# Patient Record
Sex: Female | Born: 1950 | Race: Black or African American | Hispanic: No | Marital: Single
Health system: Southern US, Community
[De-identification: ages and names within clinical notes are randomized; demographics above are authoritative.]

## PROBLEM LIST (undated history)

## (undated) DIAGNOSIS — F79 Unspecified intellectual disabilities: Secondary | ICD-10-CM

## (undated) DIAGNOSIS — I10 Essential (primary) hypertension: Secondary | ICD-10-CM

## (undated) DIAGNOSIS — T7840XA Allergy, unspecified, initial encounter: Secondary | ICD-10-CM

## (undated) HISTORY — DX: Allergy, unspecified, initial encounter: T78.40XA

## (undated) HISTORY — DX: Essential (primary) hypertension: I10

## (undated) HISTORY — DX: Unspecified intellectual disabilities: F79

## (undated) HISTORY — PX: HERNIA REPAIR: SHX51

---

## 1998-03-26 ENCOUNTER — Encounter: Admission: RE | Admit: 1998-03-26 | Discharge: 1998-03-26 | Payer: Self-pay | Admitting: Family Medicine

## 1998-07-24 ENCOUNTER — Encounter: Admission: RE | Admit: 1998-07-24 | Discharge: 1998-07-24 | Payer: Self-pay | Admitting: Family Medicine

## 1998-08-19 ENCOUNTER — Encounter: Admission: RE | Admit: 1998-08-19 | Discharge: 1998-08-19 | Payer: Self-pay | Admitting: Family Medicine

## 1999-01-26 ENCOUNTER — Encounter: Admission: RE | Admit: 1999-01-26 | Discharge: 1999-01-26 | Payer: Self-pay | Admitting: Family Medicine

## 1999-04-29 ENCOUNTER — Emergency Department (HOSPITAL_COMMUNITY): Admission: EM | Admit: 1999-04-29 | Discharge: 1999-04-30 | Payer: Self-pay | Admitting: *Deleted

## 1999-05-12 ENCOUNTER — Encounter: Admission: RE | Admit: 1999-05-12 | Discharge: 1999-05-12 | Payer: Self-pay | Admitting: Family Medicine

## 1999-06-17 ENCOUNTER — Encounter: Admission: RE | Admit: 1999-06-17 | Discharge: 1999-06-17 | Payer: Self-pay | Admitting: Family Medicine

## 1999-07-03 ENCOUNTER — Encounter: Admission: RE | Admit: 1999-07-03 | Discharge: 1999-07-03 | Payer: Self-pay | Admitting: Family Medicine

## 2000-01-06 ENCOUNTER — Encounter: Admission: RE | Admit: 2000-01-06 | Discharge: 2000-01-06 | Payer: Self-pay | Admitting: Family Medicine

## 2000-09-21 ENCOUNTER — Encounter (INDEPENDENT_AMBULATORY_CARE_PROVIDER_SITE_OTHER): Payer: Self-pay | Admitting: *Deleted

## 2000-10-03 ENCOUNTER — Encounter: Admission: RE | Admit: 2000-10-03 | Discharge: 2000-10-03 | Payer: Self-pay | Admitting: Family Medicine

## 2001-06-29 ENCOUNTER — Encounter: Admission: RE | Admit: 2001-06-29 | Discharge: 2001-06-29 | Payer: Self-pay | Admitting: Family Medicine

## 2002-02-12 ENCOUNTER — Encounter: Admission: RE | Admit: 2002-02-12 | Discharge: 2002-02-12 | Payer: Self-pay | Admitting: Sports Medicine

## 2002-03-12 ENCOUNTER — Encounter: Admission: RE | Admit: 2002-03-12 | Discharge: 2002-03-12 | Payer: Self-pay | Admitting: Family Medicine

## 2002-08-24 ENCOUNTER — Encounter: Admission: RE | Admit: 2002-08-24 | Discharge: 2002-08-24 | Payer: Self-pay | Admitting: Family Medicine

## 2003-08-05 ENCOUNTER — Encounter: Admission: RE | Admit: 2003-08-05 | Discharge: 2003-08-05 | Payer: Self-pay | Admitting: Family Medicine

## 2004-11-12 ENCOUNTER — Ambulatory Visit: Payer: Self-pay | Admitting: Family Medicine

## 2005-07-15 ENCOUNTER — Emergency Department (HOSPITAL_COMMUNITY): Admission: EM | Admit: 2005-07-15 | Discharge: 2005-07-15 | Payer: Self-pay | Admitting: Family Medicine

## 2005-08-04 ENCOUNTER — Ambulatory Visit: Payer: Self-pay | Admitting: Family Medicine

## 2005-10-20 ENCOUNTER — Ambulatory Visit: Payer: Self-pay | Admitting: Family Medicine

## 2006-02-02 ENCOUNTER — Emergency Department (HOSPITAL_COMMUNITY): Admission: EM | Admit: 2006-02-02 | Discharge: 2006-02-02 | Payer: Self-pay | Admitting: Emergency Medicine

## 2006-07-04 ENCOUNTER — Emergency Department (HOSPITAL_COMMUNITY): Admission: EM | Admit: 2006-07-04 | Discharge: 2006-07-04 | Payer: Self-pay | Admitting: Family Medicine

## 2006-07-07 ENCOUNTER — Ambulatory Visit: Payer: Self-pay | Admitting: Sports Medicine

## 2006-08-02 ENCOUNTER — Emergency Department (HOSPITAL_COMMUNITY): Admission: EM | Admit: 2006-08-02 | Discharge: 2006-08-02 | Payer: Self-pay | Admitting: Family Medicine

## 2006-11-02 ENCOUNTER — Ambulatory Visit: Payer: Self-pay | Admitting: Sports Medicine

## 2006-11-09 ENCOUNTER — Ambulatory Visit: Payer: Self-pay | Admitting: Family Medicine

## 2006-11-17 DIAGNOSIS — F70 Mild intellectual disabilities: Secondary | ICD-10-CM

## 2006-11-17 DIAGNOSIS — I10 Essential (primary) hypertension: Secondary | ICD-10-CM | POA: Insufficient documentation

## 2006-11-17 DIAGNOSIS — E669 Obesity, unspecified: Secondary | ICD-10-CM | POA: Insufficient documentation

## 2006-11-17 DIAGNOSIS — J45909 Unspecified asthma, uncomplicated: Secondary | ICD-10-CM | POA: Insufficient documentation

## 2006-11-18 ENCOUNTER — Encounter (INDEPENDENT_AMBULATORY_CARE_PROVIDER_SITE_OTHER): Payer: Self-pay | Admitting: *Deleted

## 2006-12-08 ENCOUNTER — Telehealth (INDEPENDENT_AMBULATORY_CARE_PROVIDER_SITE_OTHER): Payer: Self-pay | Admitting: Family Medicine

## 2007-01-11 ENCOUNTER — Telehealth (INDEPENDENT_AMBULATORY_CARE_PROVIDER_SITE_OTHER): Payer: Self-pay | Admitting: Family Medicine

## 2007-02-02 ENCOUNTER — Telehealth (INDEPENDENT_AMBULATORY_CARE_PROVIDER_SITE_OTHER): Payer: Self-pay | Admitting: Family Medicine

## 2007-04-06 ENCOUNTER — Encounter: Payer: Self-pay | Admitting: Family Medicine

## 2007-04-06 ENCOUNTER — Ambulatory Visit: Payer: Self-pay | Admitting: Family Medicine

## 2007-06-02 ENCOUNTER — Encounter: Payer: Self-pay | Admitting: Family Medicine

## 2007-06-21 ENCOUNTER — Telehealth (INDEPENDENT_AMBULATORY_CARE_PROVIDER_SITE_OTHER): Payer: Self-pay | Admitting: Family Medicine

## 2007-06-26 ENCOUNTER — Emergency Department (HOSPITAL_COMMUNITY): Admission: EM | Admit: 2007-06-26 | Discharge: 2007-06-26 | Payer: Self-pay | Admitting: Emergency Medicine

## 2007-06-27 ENCOUNTER — Telehealth: Payer: Self-pay | Admitting: *Deleted

## 2007-06-28 ENCOUNTER — Ambulatory Visit: Payer: Self-pay | Admitting: Family Medicine

## 2007-07-25 ENCOUNTER — Telehealth: Payer: Self-pay | Admitting: Family Medicine

## 2007-08-01 ENCOUNTER — Telehealth: Payer: Self-pay | Admitting: Family Medicine

## 2007-08-04 ENCOUNTER — Ambulatory Visit: Payer: Self-pay | Admitting: Family Medicine

## 2007-08-14 ENCOUNTER — Telehealth: Payer: Self-pay | Admitting: Family Medicine

## 2007-08-30 ENCOUNTER — Telehealth: Payer: Self-pay | Admitting: Family Medicine

## 2007-11-16 ENCOUNTER — Telehealth: Payer: Self-pay | Admitting: Family Medicine

## 2008-03-01 ENCOUNTER — Telehealth: Payer: Self-pay | Admitting: Family Medicine

## 2008-03-04 ENCOUNTER — Ambulatory Visit: Payer: Self-pay | Admitting: Family Medicine

## 2008-03-05 ENCOUNTER — Telehealth: Payer: Self-pay | Admitting: Family Medicine

## 2008-05-16 ENCOUNTER — Telehealth: Payer: Self-pay | Admitting: Family Medicine

## 2008-09-15 ENCOUNTER — Emergency Department (HOSPITAL_COMMUNITY): Admission: EM | Admit: 2008-09-15 | Discharge: 2008-09-15 | Payer: Self-pay | Admitting: Family Medicine

## 2008-10-23 ENCOUNTER — Telehealth: Payer: Self-pay | Admitting: Family Medicine

## 2008-11-04 ENCOUNTER — Telehealth: Payer: Self-pay | Admitting: *Deleted

## 2008-11-06 ENCOUNTER — Encounter: Payer: Self-pay | Admitting: Family Medicine

## 2009-03-03 ENCOUNTER — Telehealth: Payer: Self-pay | Admitting: Family Medicine

## 2009-03-05 ENCOUNTER — Ambulatory Visit: Payer: Self-pay | Admitting: Family Medicine

## 2009-04-14 ENCOUNTER — Telehealth: Payer: Self-pay | Admitting: *Deleted

## 2009-08-04 ENCOUNTER — Telehealth: Payer: Self-pay | Admitting: Family Medicine

## 2009-08-07 ENCOUNTER — Ambulatory Visit: Payer: Self-pay | Admitting: Family Medicine

## 2009-08-07 ENCOUNTER — Encounter: Payer: Self-pay | Admitting: Family Medicine

## 2009-08-07 DIAGNOSIS — J309 Allergic rhinitis, unspecified: Secondary | ICD-10-CM

## 2009-08-07 LAB — CONVERTED CEMR LAB
BUN: 23 mg/dL (ref 6–23)
Chloride: 101 meq/L (ref 96–112)
Creatinine, Ser: 1.16 mg/dL (ref 0.40–1.20)
Glucose, Bld: 79 mg/dL (ref 70–99)
LDH: 148 units/L (ref 94–250)
Potassium: 4.3 meq/L (ref 3.5–5.3)

## 2009-08-08 ENCOUNTER — Encounter: Payer: Self-pay | Admitting: Family Medicine

## 2009-09-09 ENCOUNTER — Telehealth: Payer: Self-pay | Admitting: *Deleted

## 2009-09-10 ENCOUNTER — Telehealth (INDEPENDENT_AMBULATORY_CARE_PROVIDER_SITE_OTHER): Payer: Self-pay | Admitting: *Deleted

## 2009-09-15 ENCOUNTER — Telehealth: Payer: Self-pay | Admitting: Family Medicine

## 2009-12-31 ENCOUNTER — Telehealth: Payer: Self-pay | Admitting: Family Medicine

## 2010-01-01 ENCOUNTER — Encounter: Payer: Self-pay | Admitting: *Deleted

## 2010-03-02 ENCOUNTER — Encounter: Payer: Self-pay | Admitting: Family Medicine

## 2010-06-26 ENCOUNTER — Encounter: Payer: Self-pay | Admitting: Family Medicine

## 2010-09-07 ENCOUNTER — Emergency Department (HOSPITAL_COMMUNITY)
Admission: EM | Admit: 2010-09-07 | Discharge: 2010-09-07 | Payer: Self-pay | Source: Home / Self Care | Admitting: Emergency Medicine

## 2010-09-08 ENCOUNTER — Telehealth (INDEPENDENT_AMBULATORY_CARE_PROVIDER_SITE_OTHER): Payer: Self-pay | Admitting: *Deleted

## 2010-09-30 ENCOUNTER — Ambulatory Visit: Admission: RE | Admit: 2010-09-30 | Discharge: 2010-09-30 | Payer: Self-pay | Source: Home / Self Care

## 2010-09-30 DIAGNOSIS — M25469 Effusion, unspecified knee: Secondary | ICD-10-CM | POA: Insufficient documentation

## 2010-09-30 DIAGNOSIS — G5790 Unspecified mononeuropathy of unspecified lower limb: Secondary | ICD-10-CM | POA: Insufficient documentation

## 2010-10-22 NOTE — Progress Notes (Signed)
Summary: meds prob  Phone Note Refill Request Call back at Home Phone [phone] Message from:  mom-Mary  Refills Requested: Medication #1:  VENTOLIN HFA 108 (90 BASE) MCG/ACT AERS 1-2 puffs every 4 hours as needed for cough or wheeze.  Dispense with spacer. lost the inhaler and insurance will not pay since it is too early - pls advise - would like to have 2 at a time so this won't happen again.  Initial call taken by: De Nurse,  December 31, 2009 8:49 AM  Follow-up for Phone Call        to pcp Follow-up by: Golden Circle RN,  December 31, 2009 9:10 AM  Additional Follow-up for Phone Call Additional follow up Details #1::        [facility]--[facility] [address] at [facility]. Additional Follow-up by: Romero Belling MD,  December 31, 2009 9:38 AM    New/Updated Medications: VENTOLIN HFA 108 (90 BASE) MCG/ACT AERS (ALBUTEROL SULFATE) 1-2 puffs every 4 hours as needed for cough or wheeze.  Dispense with spacer. Prescriptions: VENTOLIN HFA 108 (90 BASE) MCG/ACT AERS (ALBUTEROL SULFATE) 1-2 puffs every 4 hours as needed for cough or wheeze.  Dispense with spacer.  #2 x 3   Entered and Authorized by:   Romero Belling MD   Signed by:   Romero Belling MD on 12/31/2009   Method used:   [facility]        [facility]  [address] [id number]       [address]       [place], [place]  [address]       Ph: [phone] or [phone]       Fax: [phone]   RxID:   [id number]

## 2010-10-22 NOTE — Miscellaneous (Signed)
Summary: application  Clinical Lists Changes  rec'd application for Day Program for Adults  - pls call pt when ready De Nurse  March 02, 2010 5:35 PM  Done -- placed in "to be called" box.  Romero Belling MD  March 03, 2010 8:33 AM

## 2010-10-22 NOTE — Letter (Signed)
Summary: Generic Letter  [facility]  [address]   [place], [place] [address]   Phone: [phone]  Fax: [phone]    01/01/2010  [facility] [address] [place], [place]  [address]      Regarding above named  patient ,  Alyssa Powell :         It is noted in patient's chart  on March 04, 2008 that patient was instructed to use Proair HFA 108 ( 90 base ) Mcg/act Aers ( albuterol sulfate ) 2 puffs every four hours as needed . A new prescription for this was sent to pharmacy on 04/16/2008.               Sincerely,   Theresia Lo RN  Appended Document: Generic [facility] at their [facility]. they are being audited for this medication and since directions were to use as directed they needed to know exactly how patient was instructed to use.

## 2010-10-22 NOTE — Assessment & Plan Note (Signed)
Summary: follow up and meet new doctor/ls   Vital Signs:  Patient profile:   60 year old female Height:      64 inches Weight:      216.1 pounds BMI:     37.23 Temp:     98.4 degrees F oral Pulse rate:   78 / minute BP sitting:   148 / 88  (left arm) Cuff size:   regular  Vitals Entered By: Golden Circle RN (September 30, 2010 2:19 PM) CC: f/u left knee pain Is Patient Diabetic? No   Primary Care Provider:  Ellery Plunk MD  CC:  f/u left knee pain.  History of Present Illness: knee pain- 3 weeks ago had a hot swollen knee.  no injury.  went to urgent care had xray and was told had arthritis.  no meds prescribed.  has gotten better, less swollen, almost no pain  CPE- no concerns, no itch, no discharge, no sexual partner.  mammogram 2 years ago.  Habits & Providers  Alcohol-Tobacco-Diet     Tobacco Status: never  Current Medications (verified): 1)  Advair Diskus 500-50 Mcg/dose Aepb (Fluticasone-Salmeterol) .Marland Kitchen.. 1 Puff Inhaled Two Times A Day 2)  Ventolin Hfa 108 (90 Base) Mcg/act Aers (Albuterol Sulfate) .Marland Kitchen.. 1-2 Puffs Every 4 Hours As Needed For Cough or Wheeze.  Dispense With Spacer. 3)  Fexofenadine Hcl 180 Mg  Tabs (Fexofenadine Hcl) .Marland Kitchen.. 1 Tab By Mouth Daily 4)  Fluticasone Propionate 50 Mcg/act Susp (Fluticasone Propionate) .... 2 Sprays Each Nostril Once Daily 5)  Lisinopril-Hydrochlorothiazide 20-25 Mg Tabs (Lisinopril-Hydrochlorothiazide) .Marland Kitchen.. 1 Tab By Mouth Daily 6)  Trazodone Hcl 100 Mg Tabs (Trazodone Hcl) .... 1/2 - 1 Tab By Mouth Two Times A Day As Needed 7)  Corfen-Dm 06-23-14 Mg/77ml Liqd (Phenylephrine-Chlorphen-Dm) .... Per Instructions On Bottle 8)  Tessalon Perles 100 Mg Caps (Benzonatate) .Marland Kitchen.. 1-2 Caps By Mouth Three Times A Day As Needed For Cough  Allergies (verified): 1)  Codeine Phosphate (Codeine Phosphate) 2)  Amoxicillin (Amoxicillin)  Review of Systems  The patient denies fever, hoarseness, and prolonged cough.    Physical Exam  General:   Well-developed,well-nourished,in no acute distress; alert,appropriate and cooperative throughout examination Lungs:  Normal respiratory effort, chest expands symmetrically. Lungs are clear to auscultation, no crackles or wheezes. Heart:  Normal rate and regular rhythm. S1 and S2 normal without gallop, murmur, click, rub or other extra sounds. Abdomen:  Bowel sounds positive,abdomen soft and non-tender without masses, organomegaly or hernias noted. Genitalia:  Pelvic Exam:        External: normal female genitalia without lesions or masses        Vagina: normal without lesions or masses        Cervix: normal without lesions or masses-some friability        Adnexa: normal bimanual exam without masses or fullness        Uterus: normal by palpation        Pap smear: performed Msk:  Knee: Normal to inspection with no erythema or effusion or obvious bony abnormalities. Palpation normal with no warmth or joint line tenderness or patellar tenderness or condyle tenderness. ROM normal in flexion and extension and lower leg rotation. Ligaments with solid consistent endpoints including ACL, PCL, LCL, MCL. Negative Mcmurray's and provocative meniscal tests. Non painful patellar compression. Patellar and quadriceps tendons unremarkable. Hamstring and quadriceps strength is normal.     Impression & Recommendations:  Problem # 1:  HEALTH MAINTENANCE EXAM (ICD-V70.0) Assessment Unchanged no changes today.  pt  to return for cholesterol and bmet check Orders: [facility] - Est  40-64 yrs ([address])  Problem # 2:  EFFUSION OF LOWER LEG JOINT (ICD-719.06) Assessment: New knee joint looks normal today with minimal tenderness. hx is not consistent with OA.  consider gout.  mom with hx of gout.  will check uric acid Orders: Uric Acid-[facility] ([id number]) [facility] - Est  40-64 yrs ([id number])  Complete Medication List: 1)  Advair Diskus 500-50 Mcg/dose Aepb (Fluticasone-salmeterol) .Marland Kitchen.. 1 puff inhaled two times a day 2)   Ventolin Hfa 108 (90 Base) Mcg/act Aers (Albuterol sulfate) .Marland Kitchen.. 1-2 puffs every 4 hours as needed for cough or wheeze.  dispense with spacer. 3)  Fexofenadine Hcl 180 Mg Tabs (Fexofenadine hcl) .Marland Kitchen.. 1 tab by mouth daily 4)  Fluticasone Propionate 50 Mcg/act Susp (Fluticasone propionate) .... 2 sprays each nostril once daily 5)  Lisinopril-hydrochlorothiazide 20-25 Mg Tabs (Lisinopril-hydrochlorothiazide) .Marland Kitchen.. 1 tab by mouth daily 6)  Trazodone Hcl 100 Mg Tabs (Trazodone hcl) .... 1/2 - 1 tab by mouth two times a day as needed 7)  Corfen-dm 06-23-14 Mg/20ml Liqd (Phenylephrine-chlorphen-dm) .... Per instructions on bottle 8)  Tessalon Perles 100 Mg Caps (Benzonatate) .Marland Kitchen.. 1-2 caps by mouth three times a day as needed for cough  Other Orders: Lipid-[facility] ([id number]) Basic Met-[facility] ([id number]) Pap Smear-[facility] ([id number])  Patient Instructions: 1)  It was nice to meet  you today. 2)  Please come back for a morning lab appt to check your blood work.  Do not eat or drink after midnight the day before. 3)  Please get your mammogram   Orders Added: 1)  Lipid-[facility] [[id number]] 2)  Basic Met-[facility] [[id number]] 3)  Uric Acid-[facility] [[id number]] 4)  Pap Smear-[facility] [[id number]] 5)  [facility] - Est  40-64 yrs [[id number]]

## 2010-10-22 NOTE — Miscellaneous (Signed)
  Clinical Lists Changes  Problems: Changed problem from ASTHMA, UNSPECIFIED (ICD-493.90) to ASTHMA, PERSISTENT (ICD-493.90) 

## 2010-10-22 NOTE — Progress Notes (Signed)
Summary: refill  Phone Note Refill Request Call back at [phone] Message from:  mom-Mary  Refills Requested: Medication #1:  VENTOLIN HFA 108 (90 BASE) MCG/ACT AERS 1-2 puffs every 4 hours as needed for cough or wheeze.  Dispense with spacer.  Medication #2:  ADVAIR DISKUS 500-50 MCG/DOSE AEPB 1 puff inhaled two times a day pt is out and needs asap Also mom is requesting an rx for the Allegra  Initial call taken by: Abundio Miu,  September 08, 2010 9:48 AM Caller: Mom-Mary    see next note. Theresia Lo RN  September 08, 2010 3:19 PM

## 2010-10-31 ENCOUNTER — Other Ambulatory Visit: Payer: Self-pay | Admitting: Family Medicine

## 2010-11-01 NOTE — Telephone Encounter (Signed)
Refill request

## 2010-12-10 ENCOUNTER — Other Ambulatory Visit: Payer: Self-pay

## 2010-12-11 ENCOUNTER — Other Ambulatory Visit: Payer: Medicare Other

## 2010-12-11 DIAGNOSIS — E669 Obesity, unspecified: Secondary | ICD-10-CM

## 2010-12-11 DIAGNOSIS — M25469 Effusion, unspecified knee: Secondary | ICD-10-CM

## 2010-12-11 DIAGNOSIS — I10 Essential (primary) hypertension: Secondary | ICD-10-CM

## 2010-12-11 LAB — LIPID PANEL
LDL Cholesterol: 99 mg/dL (ref 0–99)
VLDL: 9 mg/dL (ref 0–40)

## 2010-12-11 NOTE — Progress Notes (Signed)
Drew pt for a Lipid, BMP, and uric acid. AC

## 2010-12-12 ENCOUNTER — Other Ambulatory Visit: Payer: Self-pay | Admitting: Family Medicine

## 2010-12-12 LAB — BASIC METABOLIC PANEL WITH GFR
CO2: 27 mEq/L (ref 19–32)
Calcium: 9.5 mg/dL (ref 8.4–10.5)
Creat: 1.02 mg/dL (ref 0.40–1.20)
GFR, Est African American: >60 mL/min (ref >60)
Glucose, Bld: 86 mg/dL (ref 70–99)
Sodium: 140 mEq/L (ref 135–145)

## 2010-12-13 NOTE — Telephone Encounter (Signed)
Refill request

## 2010-12-14 ENCOUNTER — Encounter: Payer: Self-pay | Admitting: Family Medicine

## 2010-12-14 ENCOUNTER — Other Ambulatory Visit: Payer: Self-pay | Admitting: Family Medicine

## 2010-12-14 MED ORDER — TRAZODONE HCL 100 MG PO TABS: 50.0000 mg | ORAL_TABLET | Freq: Two times a day (BID) | ORAL | Status: DC | PRN

## 2010-12-17 ENCOUNTER — Telehealth: Payer: Self-pay | Admitting: Family Medicine

## 2010-12-17 ENCOUNTER — Other Ambulatory Visit: Payer: Self-pay | Admitting: Family Medicine

## 2010-12-17 MED ORDER — LEVOCETIRIZINE DIHYDROCHLORIDE 5 MG PO TABS: 5.0000 mg | ORAL_TABLET | Freq: Every evening | ORAL | Status: DC

## 2010-12-17 NOTE — Telephone Encounter (Signed)
Found out that insurance will pay for allergy med called Levocetirizine 5mg  - needs [facility]ap [facility] [address] Also needs to know lab results

## 2010-12-17 NOTE — Telephone Encounter (Signed)
Blood work normal, mammogram normal, sent letter

## 2010-12-18 ENCOUNTER — Telehealth: Payer: Self-pay | Admitting: Family Medicine

## 2010-12-18 NOTE — Telephone Encounter (Signed)
This would be better coming from a nurse to call patient

## 2010-12-18 NOTE — Telephone Encounter (Signed)
Please tell her to try tylenol for what looks like some arthritis pain on xray

## 2010-12-18 NOTE — Telephone Encounter (Signed)
LMOVM for pt to return call 

## 2010-12-18 NOTE — Telephone Encounter (Signed)
Wants to know what she can take for her knee pain - since bloodwork is normal. [facility] [address]

## 2010-12-21 NOTE — Telephone Encounter (Signed)
Pt mom informed.

## 2011-01-07 ENCOUNTER — Telehealth: Payer: Self-pay | Admitting: Family Medicine

## 2011-01-07 NOTE — Telephone Encounter (Signed)
Wants to know results of her labs- knee continues to swell and is painful - tylenol is not working.

## 2011-01-08 NOTE — Telephone Encounter (Signed)
i reviewed last note.  Looks like she needs appt to discuss knee again since it wasn't typical for arthritis.  If can't see me, THomas would be a good one to see her or Ian Malkin or Clayburn Pert

## 2011-01-14 ENCOUNTER — Ambulatory Visit: Payer: Medicare Other | Admitting: Family Medicine

## 2011-05-01 ENCOUNTER — Other Ambulatory Visit: Payer: Self-pay | Admitting: Family Medicine

## 2011-05-02 NOTE — Telephone Encounter (Signed)
Refill request

## 2011-06-10 ENCOUNTER — Other Ambulatory Visit: Payer: Self-pay | Admitting: Family Medicine

## 2011-06-10 MED ORDER — ALBUTEROL SULFATE HFA 108 (90 BASE) MCG/ACT IN AERS: 2.0000 | INHALATION_SPRAY | RESPIRATORY_TRACT | Status: DC | PRN

## 2011-08-31 ENCOUNTER — Other Ambulatory Visit: Payer: Self-pay | Admitting: Family Medicine

## 2011-08-31 MED ORDER — FLUTICASONE PROPIONATE 50 MCG/ACT NA SUSP: 2.0000 | Freq: Every day | NASAL | Status: DC

## 2011-09-02 ENCOUNTER — Telehealth: Payer: Self-pay | Admitting: Family Medicine

## 2011-09-02 ENCOUNTER — Telehealth: Payer: Self-pay | Admitting: *Deleted

## 2011-09-02 MED ORDER — PHENYLEPHRINE-CHLORPHEN-DM 10-4-15 MG/5ML PO LIQD: 5.0000 mL | Freq: Two times a day (BID) | ORAL | Status: DC | PRN

## 2011-09-02 NOTE — Telephone Encounter (Signed)
Called requesting rx for cough syrup, told [name] it doesn't show anything on her med list & she will need to be seen, she is insisting on getting the rx without and appt & wants to speak with RN.

## 2011-09-02 NOTE — Telephone Encounter (Signed)
Sent Rx but patient has not been seen for almost 1 year.  Needs to have an office visit .  Please inform  Thanks LC

## 2011-09-02 NOTE — Telephone Encounter (Signed)
Called mother and informed that cough med was refilled.  Patient will need an appt within 1-2 months for follow-up visit.  Mother verbalized understanding.  Gaylene Brooks, RN

## 2011-09-02 NOTE — Telephone Encounter (Signed)
Mother calling back about cough med refill.  Will route to preceptor for review and call her back shortly.   Gaylene Brooks, RN

## 2011-09-02 NOTE — Telephone Encounter (Signed)
Returned call to patient's mother.  Mother requesting refill of Corfen DM.  Patient has chronic asthma and gets a bad cough every year.  States that patient takes this medication on a long term basis for coughs.  Medication was last filled on 06/01/11.  Has mental retardation and mother is unable to bring her in today due to being elderly and issues with transportation.  Will route to Dr. Hulen Luster for refill approval since cough syrup is on patient's med list.  Gaylene Brooks, RN

## 2011-09-03 ENCOUNTER — Other Ambulatory Visit: Payer: Self-pay | Admitting: Family Medicine

## 2011-09-03 MED ORDER — PHENYLEPHRINE-CHLORPHEN-DM 10-4-15 MG/5ML PO LIQD: 5.0000 mL | Freq: Two times a day (BID) | ORAL | Status: DC | PRN

## 2011-09-03 NOTE — Telephone Encounter (Signed)
Sent in rx.

## 2011-09-06 ENCOUNTER — Other Ambulatory Visit: Payer: Self-pay | Admitting: Family Medicine

## 2011-09-06 MED ORDER — FLUTICASONE PROPIONATE 50 MCG/ACT NA SUSP: 2.0000 | Freq: Every day | NASAL | Status: DC

## 2011-11-18 ENCOUNTER — Other Ambulatory Visit: Payer: Self-pay | Admitting: Family Medicine

## 2011-11-18 NOTE — Telephone Encounter (Signed)
Refill request

## 2011-12-02 ENCOUNTER — Telehealth: Payer: Self-pay | Admitting: Family Medicine

## 2011-12-02 NOTE — Telephone Encounter (Signed)
Alyssa Powell is calling about a referral to an Orthpaedic doctor due to her knee bothering her.  Patient is not able to walk or bend the knee.

## 2011-12-02 NOTE — Telephone Encounter (Signed)
Spoke with Mom and advised that Alyssa Powell needed an appt before referral could be given.  Mom is agreeable, she will call to schedule tomorrow when the nurse aid is there because she will be the one to bring her. Alyssa Powell, Alyssa Powell

## 2011-12-16 ENCOUNTER — Ambulatory Visit (INDEPENDENT_AMBULATORY_CARE_PROVIDER_SITE_OTHER): Payer: Medicare Other | Admitting: Family Medicine

## 2011-12-16 ENCOUNTER — Encounter: Payer: Self-pay | Admitting: Family Medicine

## 2011-12-16 ENCOUNTER — Telehealth: Payer: Self-pay | Admitting: Family Medicine

## 2011-12-16 VITALS — BP 118/79 | HR 77 | Temp 98.1°F | Ht 64.0 in | Wt 212.0 lb

## 2011-12-16 DIAGNOSIS — J45909 Unspecified asthma, uncomplicated: Secondary | ICD-10-CM | POA: Diagnosis not present

## 2011-12-16 DIAGNOSIS — IMO0002 Reserved for concepts with insufficient information to code with codable children: Secondary | ICD-10-CM | POA: Diagnosis not present

## 2011-12-16 DIAGNOSIS — M1710 Unilateral primary osteoarthritis, unspecified knee: Secondary | ICD-10-CM | POA: Diagnosis not present

## 2011-12-16 DIAGNOSIS — I10 Essential (primary) hypertension: Secondary | ICD-10-CM

## 2011-12-16 NOTE — Telephone Encounter (Signed)
Patients mom is calling about Dixie complaining about her head and nose being sore.  She sneezes a lot and has drainage.  Mom wasn't sure if this was brought up at her appt this morning and is hoping that something could be called in for her.

## 2011-12-16 NOTE — Assessment & Plan Note (Signed)
Knee pain x1-2 years. Patient has not tried Advil or Tylenol for pain. Stiffness with prolonged sitting. Minimal swelling. I think she may respond nicely to an injection but her mother was here to consent for today I am not sure she can consent for herself and certainly her mother makes her medical decisions for her. Her mother requested an orthopedic referral so i have put that in today.

## 2011-12-16 NOTE — Patient Instructions (Signed)
By your request, I have sent in a referral to orthopedic surgery. I would like you to think about getting your flu shot, pneumonia shot, mammogram You can schedule your mammogram with the paper I gave you. Please let me know if you have a colonoscopy. I cannot find this in the system

## 2011-12-16 NOTE — Telephone Encounter (Signed)
Spoke with patient mother and informed her of message from MD, she states she will try the saline spray and bring her in if it gets worse.

## 2011-12-16 NOTE — Assessment & Plan Note (Signed)
BP Readings from Last 3 Encounters:  12/16/11 118/79  09/30/10 148/88  08/07/09 112/81   Well-controlled today. Continue current medications.

## 2011-12-16 NOTE — Progress Notes (Signed)
  Subjective:    Patient ID: Alyssa Powell, female    DOB: 06/09/1951, 61 y.o.   MRN: [id number]  HPI Patient is here today for knee pain. Both knees hurt but the right is worse than the left. She says it is stiff when she is sitting for prolonged periods of time but in a couple of steps to get better and easier to walk on. This is been going on for approximately 2 years. She does not think she has tried any analgesics for this. There's been no injury and no acute worsening. She had left knee films in 2011 that pointed towards mild degenerative changes. She does experience some minimal swelling on the right side.  Asthma-patient takes her albuterol inhaler less than once per week. She uses her Advair intermittently. She feels her asthma is well-controlled. She denies cough.  Hypertension-patient denies dizziness, chest pain, shortness of breath. She takes her medication daily. She takes her blood pressures at home occasionally and they have been 120s to 130s over 70s to 80s.   Review of Systems Denies nausea vomiting diarrhea    Objective:   Physical Exam  Vital signs reviewed General appearance - alert, well appearing, and in no distress Heart - normal rate, regular rhythm, normal S1, S2, no murmurs, rubs, clicks or gallops Chest - clear to auscultation, no wheezes, rales or rhonchi, symmetric air entry, no tachypnea, retractions or cyanosis Abdomen - soft, nontender, nondistended, no masses or organomegaly Knees-bilaterally knees have crepitus. There is minimal effusion on the right knee. Neither is warm. Neither has any redness.      Assessment & Plan:

## 2011-12-16 NOTE — Telephone Encounter (Signed)
Patient has zyzal and Flonase at home. She should be taking these. She may also use nasal saline sprays. She may come in again if needed to examine her nose as I did not this morning

## 2011-12-16 NOTE — Assessment & Plan Note (Signed)
Well-controlled, using her albuterol inhaler once per week at most. Continue Advair. Recommend pneumonia vaccine.

## 2011-12-23 ENCOUNTER — Telehealth: Payer: Self-pay | Admitting: Family Medicine

## 2011-12-23 NOTE — Telephone Encounter (Signed)
Spoke with patient mother and informed her of appointment on 12/29/11 at 3pm with Dr. Montez Morita.

## 2011-12-23 NOTE — Telephone Encounter (Signed)
Is asking about referral to ortho

## 2011-12-24 ENCOUNTER — Telehealth: Payer: Self-pay | Admitting: Family Medicine

## 2011-12-24 NOTE — Telephone Encounter (Signed)
Mom is calling because they need a refill on [facility] [address]  She only has 1 left.

## 2011-12-26 ENCOUNTER — Other Ambulatory Visit: Payer: Self-pay | Admitting: Family Medicine

## 2011-12-26 MED ORDER — TRAZODONE HCL 100 MG PO TABS: 50.0000 mg | ORAL_TABLET | Freq: Two times a day (BID) | ORAL | Status: DC | PRN

## 2011-12-26 NOTE — Telephone Encounter (Signed)
Refilled trazadone

## 2011-12-27 ENCOUNTER — Telehealth: Payer: Self-pay | Admitting: Family Medicine

## 2011-12-27 NOTE — Telephone Encounter (Signed)
Primary care provider better,we'll need to wait untilDr.Spiegel back.

## 2011-12-27 NOTE — Telephone Encounter (Signed)
Alyssa Powell is asking for referral to [facility] office for Alyssa Powell as soon as possible.

## 2011-12-29 ENCOUNTER — Other Ambulatory Visit: Payer: Self-pay | Admitting: Family Medicine

## 2011-12-29 NOTE — Telephone Encounter (Signed)
This order was placed at the visit.  Not sure what the status is

## 2011-12-30 NOTE — Telephone Encounter (Signed)
Appt was yesterday.  Rescheduled for [employer].  See referral notes. Alyssa Powell, Alyssa Powell

## 2012-01-04 DIAGNOSIS — M1710 Unilateral primary osteoarthritis, unspecified knee: Secondary | ICD-10-CM | POA: Diagnosis not present

## 2012-03-15 ENCOUNTER — Other Ambulatory Visit: Payer: Self-pay | Admitting: Family Medicine

## 2012-05-17 ENCOUNTER — Ambulatory Visit (INDEPENDENT_AMBULATORY_CARE_PROVIDER_SITE_OTHER): Payer: Medicare Other | Admitting: Family Medicine

## 2012-05-17 ENCOUNTER — Encounter: Payer: Self-pay | Admitting: Family Medicine

## 2012-05-17 VITALS — BP 106/73 | HR 87 | Temp 98.1°F | Ht 64.0 in | Wt 212.0 lb

## 2012-05-17 DIAGNOSIS — J309 Allergic rhinitis, unspecified: Secondary | ICD-10-CM | POA: Diagnosis not present

## 2012-05-17 DIAGNOSIS — R21 Rash and other nonspecific skin eruption: Secondary | ICD-10-CM | POA: Diagnosis not present

## 2012-05-17 MED ORDER — DESONIDE 0.05 % EX CREA: TOPICAL_CREAM | Freq: Two times a day (BID) | CUTANEOUS | Status: AC

## 2012-05-17 MED ORDER — FLUTICASONE PROPIONATE 50 MCG/ACT NA SUSP: 2.0000 | Freq: Every day | NASAL | Status: DC

## 2012-05-17 NOTE — Progress Notes (Signed)
  Subjective:    Patient ID: Alyssa Powell, female    DOB: 1950-12-06, 61 y.o.   MRN: [id number]  HPI 1.  URI symptoms:  Describing nasal congestion and cough that started yesterday.  Nonpurulent.  No runny nose, some nose itching.  No eye drainage.  No sick contacts.    2.  Rash:  Red bumps on legs.  Present since Friday (just under 1 week).  Has used Benadryl cream with relief.  Lives with mother, who is without symptoms.  No fevers or chills.  No dogs in the house.     Review of Systems See HPI above for review of systems.       Objective:   Physical Exam BP 106/73  Pulse 87  Temp 98.1 F (36.7 C) (Oral)  Ht 5\' 4"  (1.626 m)  Wt 212 lb (96.163 kg)  BMI 36.39 kg/m2 Gen:  Patient sitting on exam table, appears stated age in no acute distress Head: Normocephalic atraumatic Eyes: EOMI, PERRL, sclera and conjunctiva non-erythematous Nose:  Nasal turbinates grossly enlarged bilaterally and boggy, no exudates noted.  Mouth: Mucosa membranes moist. Tonsils +2, nonenlarged, mildly erythematous posterior oropharynx Neck: No cervical lymphadenopathy noted Heart:  RRR, no murmurs auscultated. Pulm:  Clear to auscultation bilaterally with good air movement.  No wheezes or rales noted.           Assessment & Plan:

## 2012-05-17 NOTE — Assessment & Plan Note (Signed)
Looks like possibly flea bites. No burrows, limited area, so less likely scabies. Desonide for symptomatic relief, FU if no improvement.

## 2012-05-17 NOTE — Assessment & Plan Note (Signed)
Acutely worsening. Patient had trouble recalling medications -- her niece states that either she or her grandmother help with this.   Recommended restarting Xyzal as well as Flonase.   Believe this is mostly postnasal drip from allergic rhinitis. FU in 2 weeks if no improvement, sooner if worsening.

## 2012-05-17 NOTE — Patient Instructions (Signed)
There are 2 issues: 1)  Congestion:  I think she is having postnasal drip from her allergies that is causing her congestion.  -- Keep taking the Xyzal 1 pill a day.  -- Keep using the FLonase nasal spray daily for at least a couple of months.  2)  Itchy bumps:  Use the Desonide cream twice a day.

## 2012-05-21 ENCOUNTER — Other Ambulatory Visit: Payer: Self-pay | Admitting: Family Medicine

## 2012-06-27 DIAGNOSIS — F39 Unspecified mood [affective] disorder: Secondary | ICD-10-CM | POA: Diagnosis not present

## 2012-10-06 ENCOUNTER — Other Ambulatory Visit: Payer: Self-pay | Admitting: Family Medicine

## 2012-10-26 ENCOUNTER — Other Ambulatory Visit: Payer: Self-pay | Admitting: Family Medicine

## 2012-10-31 ENCOUNTER — Encounter: Payer: Self-pay | Admitting: Family Medicine

## 2012-10-31 ENCOUNTER — Ambulatory Visit (INDEPENDENT_AMBULATORY_CARE_PROVIDER_SITE_OTHER): Payer: Medicare Other | Admitting: Family Medicine

## 2012-10-31 VITALS — BP 100/63 | HR 66 | Temp 98.7°F | Ht 64.0 in | Wt 214.0 lb

## 2012-10-31 DIAGNOSIS — M79609 Pain in unspecified limb: Secondary | ICD-10-CM | POA: Diagnosis not present

## 2012-10-31 DIAGNOSIS — R05 Cough: Secondary | ICD-10-CM | POA: Diagnosis not present

## 2012-10-31 DIAGNOSIS — R059 Cough, unspecified: Secondary | ICD-10-CM | POA: Diagnosis not present

## 2012-10-31 DIAGNOSIS — M79674 Pain in right toe(s): Secondary | ICD-10-CM | POA: Insufficient documentation

## 2012-10-31 NOTE — Assessment & Plan Note (Signed)
Exam negative today, most likely a component on OA given chronicity. I discussed what typical gout attack would be, and advised to f/u immediately if this were the case, so it could be treated appropriately. Otherwise advised to use tylenol prn and get comfort insoles for shoes. F/u prn.

## 2012-10-31 NOTE — Progress Notes (Signed)
  Subjective:    Patient ID: Alyssa Powell, female    DOB: 1951-04-19, 62 y.o.   MRN: [id number]  Cough    1. Cough. Dry cough present for 1-2 weeks, improving. No purulent nasal drainage, but there is nasal congestion. Using robitussin with some improvement.  Has history of persistent asthma, nonsmoker. No headache, facial pain, wheezing, dyspnea, chest pain, fever, ear pain, sore throat.   2. Right toe/knee pain. Notes some right knee crepitus and slight great toe pain sometimes with walking. No pain today. Previously had left knee pain and plain film 2011 showing mild OA.  She does not describe redness, swelling, warmth or acute pain episodes. No injury.   Review of Systems  Respiratory: Positive for cough.    See HPI otherwise negative.  reports that she has never smoked. She does not have any smokeless tobacco history on file.     Objective:   Physical Exam  Vitals reviewed. Constitutional: She is oriented to person, place, and time. She appears well-developed and well-nourished. No distress.  HENT:  Head: Normocephalic and atraumatic.  Right Ear: External ear normal.  Mouth/Throat: Oropharynx is clear and moist.  Left ear cerumen impaction.  Mild clear rhinorrhea. No sinus TTP.    Eyes: Conjunctivae and EOM are normal. Pupils are equal, round, and reactive to light.  Neck: Normal range of motion. Neck supple.  Cardiovascular: Normal rate, regular rhythm and normal heart sounds.   No murmur heard. Pulmonary/Chest: Effort normal and breath sounds normal. No respiratory distress. She has no wheezes. She has no rales.  Musculoskeletal: She exhibits no edema and no tenderness.  Right great toe appears wnl. ROM intact, no erythema, edema or TTP.  Right knee +crepitus with ROM. No definite effusion on exam.  Lymphadenopathy:    She has no cervical adenopathy.  Neurological: She is alert and oriented to person, place, and time.  Skin: No rash noted. She is not diaphoretic.           Assessment & Plan:

## 2012-10-31 NOTE — Assessment & Plan Note (Signed)
Most likely post-viral cough improving. No sign or red flags for pneumonia, sinusitis or acute asthma. Advised to continue chronic asthma controller inhaler, hydration, robitussin prn and f/u if not continually improving.

## 2012-10-31 NOTE — Patient Instructions (Addendum)
I think your cough is from a cold virus, getting better. Your lungs sound clear. Keep using robitussin or mucinex. Use nasal saline to keep sinuses cleaned out. You most likely have some arthritis. Make appointment if you have shortness of breath, worsened wheezing, fever, toe/knee swelling or warmth. You can try tylenol for knee/toe pain.  Upper Respiratory Infection, Adult An upper respiratory infection (URI) is also known as the common cold. It is often caused by a type of germ (virus). Colds are easily spread (contagious). You can pass it to others by kissing, coughing, sneezing, or drinking out of the same glass. Usually, you get better in 1 or 2 weeks.  HOME CARE   Only take medicine as told by your doctor.  Use a warm mist humidifier or breathe in steam from a hot shower.  Drink enough water and fluids to keep your pee (urine) clear or pale yellow.  Get plenty of rest.  Return to work when your temperature is back to normal or as told by your doctor. You may use a face mask and wash your hands to stop your cold from spreading. GET HELP RIGHT AWAY IF:   After the first few days, you feel you are getting worse.  You have questions about your medicine.  You have chills, shortness of breath, or brown or red spit (mucus).  You have yellow or brown snot (nasal discharge) or pain in the face, especially when you bend forward.  You have a fever, puffy (swollen) neck, pain when you swallow, or white spots in the back of your throat.  You have a bad headache, ear pain, sinus pain, or chest pain.  You have a high-pitched whistling sound when you breathe in and out (wheezing).  You have a lasting cough or cough up blood.  You have sore muscles or a stiff neck. MAKE SURE YOU:   Understand these instructions.  Will watch your condition.  Will get help right away if you are not doing well or get worse. Document Released: 02/23/2008 Document Revised: 11/29/2011 Document  Reviewed: 01/11/2011 [facility] Patient Information 2013 [place], [place].

## 2012-11-04 ENCOUNTER — Other Ambulatory Visit: Payer: Self-pay | Admitting: Family Medicine

## 2012-11-08 ENCOUNTER — Other Ambulatory Visit: Payer: Self-pay | Admitting: Family Medicine

## 2012-11-24 ENCOUNTER — Ambulatory Visit: Payer: Medicare Other | Admitting: Family Medicine

## 2012-11-27 ENCOUNTER — Other Ambulatory Visit: Payer: Self-pay | Admitting: Family Medicine

## 2012-11-28 ENCOUNTER — Other Ambulatory Visit: Payer: Self-pay | Admitting: Family Medicine

## 2013-02-03 ENCOUNTER — Other Ambulatory Visit: Payer: Self-pay | Admitting: Family Medicine

## 2013-02-12 ENCOUNTER — Other Ambulatory Visit: Payer: Self-pay | Admitting: Family Medicine

## 2013-03-28 ENCOUNTER — Other Ambulatory Visit: Payer: Self-pay | Admitting: Family Medicine

## 2013-04-27 ENCOUNTER — Other Ambulatory Visit: Payer: Self-pay | Admitting: Family Medicine

## 2013-05-01 ENCOUNTER — Encounter (HOSPITAL_COMMUNITY): Payer: Self-pay | Admitting: Emergency Medicine

## 2013-05-01 ENCOUNTER — Emergency Department (INDEPENDENT_AMBULATORY_CARE_PROVIDER_SITE_OTHER)
Admission: EM | Admit: 2013-05-01 | Discharge: 2013-05-01 | Disposition: A | Discharge status: Home / Self Care | Payer: Medicare Other | Source: Home / Self Care | Attending: Family Medicine | Admitting: Family Medicine

## 2013-05-01 DIAGNOSIS — J45901 Unspecified asthma with (acute) exacerbation: Secondary | ICD-10-CM

## 2013-05-01 MED ORDER — ALBUTEROL SULFATE HFA 108 (90 BASE) MCG/ACT IN AERS: 2.0000 | INHALATION_SPRAY | RESPIRATORY_TRACT | Status: DC | PRN

## 2013-05-01 MED ORDER — ALBUTEROL SULFATE (5 MG/ML) 0.5% IN NEBU
5.0000 mg | INHALATION_SOLUTION | Freq: Once | RESPIRATORY_TRACT | Status: AC
  Administered 2013-05-01: 5 mg via RESPIRATORY_TRACT

## 2013-05-01 MED ORDER — METHYLPREDNISOLONE SODIUM SUCC 125 MG IJ SOLR
125.0000 mg | Freq: Once | INTRAMUSCULAR | Status: AC
  Administered 2013-05-01: 125 mg via INTRAMUSCULAR

## 2013-05-01 MED ORDER — ALBUTEROL SULFATE (5 MG/ML) 0.5% IN NEBU
INHALATION_SOLUTION | RESPIRATORY_TRACT | Status: AC
  Filled 2013-05-01: qty 1

## 2013-05-01 MED ORDER — METHYLPREDNISOLONE SODIUM SUCC 125 MG IJ SOLR
INTRAMUSCULAR | Status: AC
  Filled 2013-05-01: qty 2

## 2013-05-01 MED ORDER — PREDNISONE 50 MG PO TABS: ORAL_TABLET | ORAL | Status: DC

## 2013-05-01 NOTE — ED Provider Notes (Signed)
CSN: [id number]     Arrival date & time 05/01/13  1217 History     None    Chief Complaint  Patient presents with  . Asthma    sob and chest tightness   (Consider location/radiation/quality/duration/timing/severity/associated sxs/prior Treatment) HPI Comments: History of present illness and review of systems limited due to patient mental status.   62 year old female presents for evaluation of worsening of her asthma. She says she says she has had a stuffy nose and she feels herself wheezing. She has been using her Ventolin inhaler at home but has not been helping. She denies pain in her chest, but is feeling short of breath for now. Denies pleuritic pain, fever, chills, NVD. Denies cough or hemoptysis. She does have a history of asthma  Patient is a 62 y.o. female presenting with asthma.  Asthma Associated symptoms include shortness of breath.    Past Medical History  Diagnosis Date  . Allergy   . Asthma   . Hypertension   . Mental retardation    History reviewed. No pertinent past surgical history. History reviewed. No pertinent family history. History  Substance Use Topics  . Smoking status: Never Smoker   . Smokeless tobacco: Not on file  . Alcohol Use: No   OB History   Grav Para Term Preterm Abortions TAB SAB Ect Mult Living                 Review of Systems  HENT: Positive for congestion.   Respiratory: Positive for shortness of breath and wheezing.     Allergies  Amoxicillin and Codeine phosphate  Home Medications   Current Outpatient Rx  Name  Route  Sig  Dispense  Refill  . ADVAIR DISKUS 500-50 MCG/DOSE AEPB      USE 1 PUFF BY MOUTH TWICE A DAY   60 each   11   . levocetirizine (XYZAL) 5 MG tablet      TAKE 1 TABLET (5 MG TOTAL) BY MOUTH EVERY EVENING.   30 tablet   1   . lisinopril-hydrochlorothiazide (PRINZIDE,ZESTORETIC) 20-25 MG per tablet      1 TAB BY MOUTH DAILY   30 tablet   7     CUSTOMER NEEDS REFILLS PLEASE. THANK YOU   .  PROAIR HFA 108 (90 BASE) MCG/ACT inhaler      INHALE 2 PUFFS INTO THE LUNGS EVERY 4 (FOUR) HOURS AS NEEDED FOR WHEEZING.   8.5 each   4   . albuterol (VENTOLIN HFA) 108 (90 BASE) MCG/ACT inhaler   Inhalation   Inhale 2 puffs into the lungs every 4 (four) hours as needed for wheezing.   18 g   5   . desonide (DESOWEN) 0.05 % cream   Topical   Apply topically 2 (two) times daily.   30 g   0   . fluticasone (FLONASE) 50 MCG/ACT nasal spray      USE 2 SPRAYS INTO THE NOSE DAILY.   16 g   1   . NOHIST-DM 06-23-14 MG/5ML LIQD      TAKE 5 MLS BY MOUTH 2 (TWO) TIMES DAILY AS NEEDED.   473 mL   0   . NOHIST-DM 06-23-14 MG/5ML LIQD      TAKE 5 MLS BY MOUTH 2 (TWO) TIMES DAILY AS NEEDED.   473 mL   0   . predniSONE (DELTASONE) 50 MG tablet      1 tab PO QD   3 tablet   0   .  trazodone (DESYREL) 300 MG tablet      TAKE 1 TABLET AT BEDTIME AS NEEDED FOR SLEEP.   30 tablet   0     Must have office visit for additional refills.    Pulse 65  Temp(Src) 98.8 F (37.1 C) (Oral)  Resp 25  SpO2 100% Physical Exam  Nursing note and vitals reviewed. Constitutional: She appears well-developed and well-nourished. No distress.  HENT:  Head: Normocephalic and atraumatic.  Right Ear: External ear normal.  Left Ear: External ear normal.  Nose: Nose normal.  Mouth/Throat: Oropharynx is clear and moist. No oropharyngeal exudate.  Eyes: Conjunctivae and EOM are normal. Pupils are equal, round, and reactive to light. Right eye exhibits no discharge. Left eye exhibits no discharge.  Neck: Normal range of motion. Neck supple.  Pulmonary/Chest: Effort normal. No respiratory distress. She has wheezes (Diffuse). She has no rales. She exhibits no tenderness.  Neurological: She is alert.  Skin: Skin is dry. No rash noted. She is not diaphoretic.  Psychiatric: She has a normal mood and affect.    ED Course   Procedures (including critical care time)  Labs Reviewed - No data to  display No results found. 1. Asthma exacerbation, mild     MDM  Significantly improved symptomatically and to auscultation after her breathing treatment. Discharge with 50 mg prednisone daily for 3 days and refill of her inhaler. She will followup if she does not continue to improve.   Meds ordered this encounter  Medications  . albuterol (PROVENTIL) (5 MG/ML) 0.5% nebulizer solution 5 mg    Sig:   . methylPREDNISolone sodium succinate (SOLU-MEDROL) 125 mg/2 mL injection 125 mg    Sig:   . predniSONE (DELTASONE) 50 MG tablet    Sig: 1 tab PO QD    Dispense:  3 tablet    Refill:  0  . albuterol (VENTOLIN HFA) 108 (90 BASE) MCG/ACT inhaler    Sig: Inhale 2 puffs into the lungs every 4 (four) hours as needed for wheezing.    Dispense:  18 g    Refill:  5     Graylon Good, PA-C 05/01/13 1412

## 2013-05-01 NOTE — ED Notes (Signed)
C/o sob, nasal stuffiness, and chest tightness. Pt has used inhalers with no relief in symptoms. States symptoms present for a couple of days.  Denies any other symptoms.

## 2013-05-01 NOTE — ED Notes (Signed)
Waiting discharge papers 

## 2013-05-04 ENCOUNTER — Other Ambulatory Visit: Payer: Self-pay | Admitting: Family Medicine

## 2013-05-05 NOTE — ED Provider Notes (Signed)
Medical screening examination/treatment/procedure(s) were performed by resident physician or non-physician practitioner and as supervising physician I was immediately available for consultation/collaboration.   Warnell Rasnic DOUGLAS MD.   Ralf Konopka D Corita Allinson, MD 05/05/13 0951 

## 2013-05-25 DIAGNOSIS — F22 Delusional disorders: Secondary | ICD-10-CM | POA: Diagnosis not present

## 2013-05-25 DIAGNOSIS — F411 Generalized anxiety disorder: Secondary | ICD-10-CM | POA: Diagnosis not present

## 2013-05-29 ENCOUNTER — Other Ambulatory Visit: Payer: Self-pay | Admitting: Family Medicine

## 2013-06-23 ENCOUNTER — Other Ambulatory Visit: Payer: Self-pay | Admitting: Family Medicine

## 2013-06-25 ENCOUNTER — Other Ambulatory Visit: Payer: Self-pay | Admitting: Family Medicine

## 2013-07-09 ENCOUNTER — Encounter: Payer: Self-pay | Admitting: Family Medicine

## 2013-07-09 ENCOUNTER — Ambulatory Visit (INDEPENDENT_AMBULATORY_CARE_PROVIDER_SITE_OTHER): Payer: Medicare Other | Admitting: Family Medicine

## 2013-07-09 VITALS — BP 134/87 | HR 60 | Temp 98.6°F | Ht 66.0 in | Wt 218.0 lb

## 2013-07-09 DIAGNOSIS — R059 Cough, unspecified: Secondary | ICD-10-CM | POA: Diagnosis not present

## 2013-07-09 DIAGNOSIS — Z23 Encounter for immunization: Secondary | ICD-10-CM

## 2013-07-09 DIAGNOSIS — R05 Cough: Secondary | ICD-10-CM

## 2013-07-09 MED ORDER — LEVOCETIRIZINE DIHYDROCHLORIDE 5 MG PO TABS: ORAL_TABLET | ORAL | Status: DC

## 2013-07-09 MED ORDER — BENZONATATE 200 MG PO CAPS: 200.0000 mg | ORAL_CAPSULE | Freq: Three times a day (TID) | ORAL | Status: DC | PRN

## 2013-07-09 NOTE — Progress Notes (Signed)
Patient ID: Ericha Whittingham, female   DOB: 1951/05/03, 62 y.o.   MRN: [id number]  [facility] Sophiah Rolin M. Kazumi Lachney, MD Phone: [phone]   Subjective: HPI: Patient is a 62 y.o. female presenting to clinic today for cough.  Cough - Patient with history of asthma and allergic rhinitis. She states current cough has been bothering her for about 2 weeks. Cough is non-productive, getting a little better. She is on Advair, flonase, and albuterol prn. She states she uses albuterol daily in her medication regimen and has not increased use. She has tried Robitussin at home with no relief. She also endorses a stuffy nose, itchy throat and wheezing. No fevers.   History Reviewed: Non-smoker. Health Maintenance: Due for flu vaccine today  ROS: Please see HPI above.  Objective: Office vital signs reviewed. BP 134/87  Pulse 60  Temp(Src) 98.6 F (37 C) (Oral)  Ht 5\' 6"  (1.676 m)  Wt 218 lb (98.884 kg)  BMI 35.2 kg/m2  Physical Examination:  General: Awake, alert. NAD. Daughter at bedside HEENT: Atraumatic, normocephalic. MMM. Nasal turbinates erythematous and edematous. Posterior pharynx erythematous with some drainage noted. Neck: No masses palpated. No LAD Pulm: CTAB, no wheezes. Good effort. Cardio: RRR, no murmurs appreciated Skin: No rash Neuro: Grossly intact, however seems confused and slow to answer questions  Assessment: 62 y.o. female with cough  Plan: See Problem List and After Visit Summary

## 2013-07-09 NOTE — Assessment & Plan Note (Addendum)
Lungs are clear; not due to asthma, bronchitis or pnuemonia. Most likely secondary to allergic rhinitis or post-viral cough. Con't Xyzal and Flonase. Given Rx for Tessalon to suppress cough as need. Also, patient instructed to only use Albuterol as needed rather than daily. Daughter states understanding and will make sure patient does this. Return if anything changes or worsens.

## 2013-07-09 NOTE — Patient Instructions (Signed)
I think your cough is from your allergies. Continue taking Xyzal daily, and use your Flonase. Use Advair every day, and albuterol only as needed for wheezing, cough or shortness of breath.  Let me know if anything changes or gets worse.  Dannette Kinkaid M. Sharonann Malbrough, M.D.

## 2013-07-26 DIAGNOSIS — F71 Moderate intellectual disabilities: Secondary | ICD-10-CM | POA: Diagnosis not present

## 2013-07-27 ENCOUNTER — Other Ambulatory Visit: Payer: Self-pay | Admitting: Family Medicine

## 2013-08-10 ENCOUNTER — Telehealth: Payer: Self-pay | Admitting: Family Medicine

## 2013-08-10 NOTE — Telephone Encounter (Signed)
Form dropped off to be filled out for adult daycare.  Please call when completed.

## 2013-08-13 NOTE — Telephone Encounter (Signed)
Clinic portion complete and placed in MD box. Fleeger, [name]

## 2013-08-14 NOTE — Telephone Encounter (Signed)
Form completed and returned to blue team.  [place] M. Everlee Quakenbush, M.D.  

## 2013-08-14 NOTE — Telephone Encounter (Signed)
Pt niece informed, placed up front for pickup. Sheliah Fiorillo, [name]

## 2013-10-01 ENCOUNTER — Telehealth: Payer: Self-pay | Admitting: Family Medicine

## 2013-10-01 DIAGNOSIS — M79676 Pain in unspecified toe(s): Secondary | ICD-10-CM

## 2013-10-01 NOTE — Telephone Encounter (Signed)
Mother called because Alyssa Powell's foot doctor. Dr. Celene Skeenuckman needs someone from our office to call and verify that we are her PCP and what insurance she has on file with us. This is a new policy that their office is doing . Please call [phone]. Myriam Jacobsonjw

## 2013-10-02 NOTE — Telephone Encounter (Signed)
I am ok with this referral. I will place referral to podiatry today, however, Ms. Clearance CootsHarper does need a routine check up with me. Her last several visits have been for same day complaints. Please have the family schedule a time within the next month for a routine visit.  Thank you, Nadyne Gariepy M. Evans Levee, M.D.

## 2013-10-02 NOTE — Telephone Encounter (Signed)
Spoke with mom, she is requesting a referral to see Dr. Leeanne Deeduchman.  Advised appt would be needed for referral.  Mom states that she is handicap and has difficulty getting transportation and "would really appreciate the referral if at all possible, since my other dgt who would take her to the MD is in grad school and does not have a lot of free time."   Mom states that the nurse at her day center thinks she may have an ingrown toenail and she needs foot care.  Advised I would send message to MD. Milas GainFleeger, Maryjo RochesterJessica Dawn

## 2013-10-02 NOTE — Telephone Encounter (Signed)
Pt mother informed that referral coordinator will be calling and the need for an appt. Fleeger, Alyssa Powell

## 2013-10-19 ENCOUNTER — Ambulatory Visit (INDEPENDENT_AMBULATORY_CARE_PROVIDER_SITE_OTHER): Payer: Medicare Other | Admitting: Family Medicine

## 2013-10-19 VITALS — BP 138/84 | HR 75 | Temp 98.3°F | Wt 214.0 lb

## 2013-10-19 DIAGNOSIS — R450 Nervousness: Secondary | ICD-10-CM | POA: Diagnosis not present

## 2013-10-19 DIAGNOSIS — M25579 Pain in unspecified ankle and joints of unspecified foot: Secondary | ICD-10-CM | POA: Diagnosis not present

## 2013-10-19 DIAGNOSIS — M25571 Pain in right ankle and joints of right foot: Secondary | ICD-10-CM

## 2013-10-19 DIAGNOSIS — I10 Essential (primary) hypertension: Secondary | ICD-10-CM

## 2013-10-19 DIAGNOSIS — J45909 Unspecified asthma, uncomplicated: Secondary | ICD-10-CM

## 2013-10-19 LAB — CBC
HCT: 42.3 % (ref 36.0–46.0)
Hemoglobin: 14.3 g/dL (ref 12.0–15.0)
MCH: 28.1 pg (ref 26.0–34.0)
MCHC: 33.8 g/dL (ref 30.0–36.0)
MCV: 83.1 fL (ref 78.0–100.0)
PLATELETS: 302 10*3/uL (ref 150–400)
RBC: 5.09 MIL/uL (ref 3.87–5.11)
RDW: 13.3 % (ref 11.5–15.5)
WBC: 5.1 10*3/uL (ref 4.0–10.5)

## 2013-10-19 MED ORDER — ALBUTEROL SULFATE HFA 108 (90 BASE) MCG/ACT IN AERS: INHALATION_SPRAY | RESPIRATORY_TRACT | Status: DC

## 2013-10-19 NOTE — Patient Instructions (Signed)
It was good to see you.  I would recommend looking into the [facility] program to see if that is something your family may be interested in.  I have sent a refill on the inhaler to the pharmacy.  We will check labs today.  Keep moving your ankle to help with the pain.  I will see you back in 6 months or sooner as needed.  Javarus Dorner M. Carrieanne Kleen, M.D.

## 2013-10-19 NOTE — Progress Notes (Signed)
Patient ID: Alyssa Powell, female   DOB: Jan 23, 1951, 63 y.o.   MRN: [id number]    Subjective: HPI: Patient is a 63 y.o. female presenting to clinic today for follow up appointment. She is brought in by her niece today. Concerns today include ankle pain, and her mother is concerned about her nerves  1. Ankle pain- Right ankle has been hurting her for "a while." She states if she has been sitting a long time and stand up it feels stiff. No injury to ankle. No swelling, no redness. Good ROM.   2. Nerves- Her mother is concerned that she acts nervous. Patient states she feels a little nervous, feels more nervous when "she wants to eat." She has to walk around a lot to pace. Her mom states her hands shake when she gets nervous. This has been going on for a few months, getting worse. She states she does not "pay it no mind" when she gets nervous.  3. Refills- Needs refill on Al[facility] to [facility] to [facility]. She has not been using her inhaler more than usual, but niece states she uses it appropriately.  4. Hypertension Blood pressure today: 138/84 Taking Meds: Yes, Lisinopril-HCTZ Side effects: No side effects ROS: Denies headache, visual changes, nausea, vomiting, chest pain, abdominal pain or shortness of breath.  History Reviewed: Non-smoker. Health Maintenance: Needs labs today  ROS: Please see HPI above.  Objective: Office vital signs reviewed. BP 138/84  Pulse 75  Temp(Src) 98.3 F (36.8 C) (Oral)  Wt 214 lb (97.07 kg)  Physical Examination:  General: Awake, alert. NAD. Pleasant HEENT: Atraumatic, normocephalic Neck: No masses palpated. No LAD Pulm: CTAB, no wheezes. Good effort Cardio: RRR, no murmurs appreciated Abdomen:+BS, soft, nontender, nondistended Extremities: No edema. GOod ROM of ankle, no TTP or redness appreciated. Good distal pulses Neuro: Grossly intact  Assessment: 63 y.o. female follow up  Plan: See Problem List and After Visit Summary

## 2013-10-20 LAB — COMPREHENSIVE METABOLIC PANEL
ALBUMIN: 4.1 g/dL (ref 3.5–5.2)
ALT: 13 U/L (ref 0–35)
AST: 14 U/L (ref 0–37)
Alkaline Phosphatase: 61 U/L (ref 39–117)
BUN: 17 mg/dL (ref 6–23)
CALCIUM: 9 mg/dL (ref 8.4–10.5)
CHLORIDE: 100 meq/L (ref 96–112)
CO2: 31 meq/L (ref 19–32)
Creat: 1.02 mg/dL (ref 0.50–1.10)
GLUCOSE: 82 mg/dL (ref 70–99)
POTASSIUM: 4.2 meq/L (ref 3.5–5.3)
SODIUM: 138 meq/L (ref 135–145)
TOTAL PROTEIN: 7 g/dL (ref 6.0–8.3)
Total Bilirubin: 0.4 mg/dL (ref 0.2–1.2)

## 2013-10-20 LAB — LIPID PANEL
CHOL/HDL RATIO: 2.8 ratio
CHOLESTEROL: 155 mg/dL (ref 0–200)
HDL: 55 mg/dL (ref >39)
LDL Cholesterol: 85 mg/dL (ref 0–99)
Triglycerides: 74 mg/dL (ref <150)
VLDL: 15 mg/dL (ref 0–40)

## 2013-10-22 ENCOUNTER — Encounter: Payer: Self-pay | Admitting: Family Medicine

## 2013-10-22 DIAGNOSIS — R450 Nervousness: Secondary | ICD-10-CM | POA: Insufficient documentation

## 2013-10-22 DIAGNOSIS — M25571 Pain in right ankle and joints of right foot: Secondary | ICD-10-CM | POA: Insufficient documentation

## 2013-10-22 NOTE — Assessment & Plan Note (Signed)
Unsure of exact concerns, but patient appears well today. Encouraged family to keep her active. I advise against any type of nerve medication at this time because of the negative side effects. I have also given them information about [facility] which could be a good resource for them.

## 2013-10-22 NOTE — Assessment & Plan Note (Signed)
Stable. Will check routine labs today. Con't Lisinopril. F/u in 6 months

## 2013-10-22 NOTE — Assessment & Plan Note (Signed)
Stable and well controlled. GIven albuterol refills to take to adult day center to use as needed.

## 2013-10-22 NOTE — Assessment & Plan Note (Signed)
NO injury, no red flags. Most likely OA. Con't ROM exercises and Tylenol as needed for pain.

## 2013-12-17 ENCOUNTER — Ambulatory Visit (INDEPENDENT_AMBULATORY_CARE_PROVIDER_SITE_OTHER): Payer: Medicare Other | Admitting: Family Medicine

## 2013-12-17 ENCOUNTER — Encounter: Payer: Self-pay | Admitting: Family Medicine

## 2013-12-17 VITALS — BP 126/80 | HR 67 | Temp 97.6°F | Ht 64.0 in | Wt 214.0 lb

## 2013-12-17 DIAGNOSIS — B9789 Other viral agents as the cause of diseases classified elsewhere: Secondary | ICD-10-CM

## 2013-12-17 DIAGNOSIS — B349 Viral infection, unspecified: Secondary | ICD-10-CM | POA: Insufficient documentation

## 2013-12-17 NOTE — Patient Instructions (Addendum)
Viral Infections A virus is a type of germ. Viruses can cause:  Minor sore throats.  Aches and pains.  Headaches.  Runny nose.  Rashes.  Watery eyes.  Tiredness.  Coughs.  Loss of appetite.  Feeling sick to your stomach (nausea).  Throwing up (vomiting).  Watery poop (diarrhea). HOME CARE   Only take medicines as told by your doctor.  Drink enough water and fluids to keep your pee (urine) clear or pale yellow. Sports drinks are a good choice.  Get plenty of rest and eat healthy. Soups and broths with crackers or rice are fine. GET HELP RIGHT AWAY IF:   You have a very bad headache.  You have shortness of breath.  You have chest pain or neck pain.  You have an unusual rash.  You cannot stop throwing up.  You have watery poop that does not stop.  You cannot keep fluids down.  You or your child has a temperature by mouth above 102 F (38.9 C), not controlled by medicine.  Your baby is older than 3 months with a rectal temperature of 102 F (38.9 C) or higher.  Your baby is 653 months old or younger with a rectal temperature of 100.4 F (38 C) or higher. MAKE SURE YOU:   Understand these instructions.  Will watch this condition.  Will get help right away if you are not doing well or get worse. Document Released: 08/19/2008 Document Revised: 11/29/2011 Document Reviewed: 01/12/2011 [facility] Patient Information 2014 [place].   You may return to [facility] starting today 12/17/13. I think that your symptoms are due to a viral illness. You are not currently having a flair of your asthma. Continue over the counter cough and cold medications, continue Albuterol as needed for wheezing/sob.

## 2013-12-17 NOTE — Assessment & Plan Note (Signed)
Patient presents with signs and symptoms consistent with viral illness. Exam not consistent with acute exacerbation of asthma. -Conservative measures discussed as outlined in the patient instructions section

## 2013-12-17 NOTE — Progress Notes (Signed)
   Subjective:    Patient ID: Alyssa Powell, female    DOB: 12-Oct-1950, 63 y.o.   MRN: [id number]  HPI 63 year old [place] American female presents for evaluation of possible asthma exacerbation, patient reports two-week history of nasal congestion, rhinorrhea, and mildly productive cough. No associated fevers or chills, patient uses Advair twice daily as needed to use her albuterol once daily, no associated sick contacts, no myalgias, she has not taken any over-the-counter cough or cold medications, no double pain, no nausea, no emesis, no diarrhea   Review of Systems  Constitutional: Negative for fever, chills and fatigue.  HENT: Positive for congestion and rhinorrhea.   Respiratory: Positive for cough. Negative for chest tightness.   Cardiovascular: Negative for chest pain.  Gastrointestinal: Negative for nausea, vomiting and diarrhea.       Objective:   Physical Exam Vitals: Reviewed General: Pleasant African American female, no acute distress HEENT: Normocephalic, bilateral TMs are pearly-gray, extraocular movements are intact, pupils are equal round and reactive to light, nasal congestion present, no maxillary or frontal sinus tenderness, moist mucous membranes, no pharyngeal erythema or exudate noted, neck was supple, no anterior or posterior cervical lymphadenopathy Cardiac: Regular in rhythm, S1 and S2 present, no murmurs, no heaves or thrills Respiratory: Clear to auscultation bilaterally, normal effort  Abd: soft, no tenderness Skin: No rash       Assessment & Plan:  Please see problem specific assessment and plan.

## 2013-12-31 ENCOUNTER — Other Ambulatory Visit: Payer: Self-pay | Admitting: Family Medicine

## 2014-01-07 ENCOUNTER — Encounter (HOSPITAL_COMMUNITY): Payer: Self-pay | Admitting: Emergency Medicine

## 2014-01-07 ENCOUNTER — Telehealth: Payer: Self-pay | Admitting: *Deleted

## 2014-01-07 ENCOUNTER — Emergency Department (INDEPENDENT_AMBULATORY_CARE_PROVIDER_SITE_OTHER)
Admission: EM | Admit: 2014-01-07 | Discharge: 2014-01-07 | Disposition: A | Discharge status: Home / Self Care | Payer: Medicare Other | Source: Home / Self Care | Attending: Family Medicine | Admitting: Family Medicine

## 2014-01-07 DIAGNOSIS — J45909 Unspecified asthma, uncomplicated: Secondary | ICD-10-CM

## 2014-01-07 MED ORDER — IPRATROPIUM-ALBUTEROL 0.5-2.5 (3) MG/3ML IN SOLN
6.0000 mL | Freq: Once | RESPIRATORY_TRACT | Status: AC
  Administered 2014-01-07: 6 mL via RESPIRATORY_TRACT

## 2014-01-07 MED ORDER — PREDNISONE 50 MG PO TABS: 50.0000 mg | ORAL_TABLET | Freq: Every day | ORAL | Status: DC

## 2014-01-07 MED ORDER — DOXYCYCLINE HYCLATE 100 MG PO CAPS: 100.0000 mg | ORAL_CAPSULE | Freq: Two times a day (BID) | ORAL | Status: DC

## 2014-01-07 MED ORDER — IPRATROPIUM-ALBUTEROL 0.5-2.5 (3) MG/3ML IN SOLN
RESPIRATORY_TRACT | Status: AC
  Filled 2014-01-07: qty 3

## 2014-01-07 MED ORDER — ALBUTEROL SULFATE (2.5 MG/3ML) 0.083% IN NEBU
INHALATION_SOLUTION | RESPIRATORY_TRACT | Status: AC
  Filled 2014-01-07: qty 6

## 2014-01-07 MED ORDER — ALBUTEROL SULFATE (2.5 MG/3ML) 0.083% IN NEBU
5.0000 mg | INHALATION_SOLUTION | Freq: Once | RESPIRATORY_TRACT | Status: AC
  Administered 2014-01-07: 5 mg via RESPIRATORY_TRACT

## 2014-01-07 NOTE — ED Provider Notes (Signed)
CSN: [id number]     Arrival date & time 01/07/14  1423 History   First MD Initiated Contact with Patient 01/07/14 1639     Chief Complaint  Patient presents with  . URI   (Consider location/radiation/quality/duration/timing/severity/associated sxs/prior Treatment) HPI Comments: History of present illness obtained via the help of her son, patient is MR   63 year old female presents for evaluation of congestion, runny nose, scratchy throat, sneezing. This originally began about 6 weeks ago. She went to her primary care physician who treated symptomatically. She better, but then her symptoms started to get worse again. They have been worse for about 2 weeks. She has not been taking any medicine to help this. She also is having some wheezing, like her asthma may be bothering her. She denies chest pain, shortness of breath, fever, chills, NVD.   Past Medical History  Diagnosis Date  . Allergy   . Asthma   . Hypertension   . Mental retardation    History reviewed. No pertinent past surgical history. No family history on file. History  Substance Use Topics  . Smoking status: Never Smoker   . Smokeless tobacco: Not on file  . Alcohol Use: No   OB History   Grav Para Term Preterm Abortions TAB SAB Ect Mult Living                 Review of Systems  Constitutional: Negative for fever and chills.  HENT: Positive for congestion, postnasal drip and rhinorrhea. Negative for ear pain, sinus pressure and sore throat.   Eyes: Negative for discharge and itching.  Respiratory: Positive for cough, chest tightness and wheezing. Negative for shortness of breath.   Cardiovascular: Negative for chest pain and palpitations.  Gastrointestinal: Negative for nausea, vomiting, abdominal pain and diarrhea.  Skin: Negative for rash.  All other systems reviewed and are negative.   Allergies  Amoxicillin and Codeine phosphate  Home Medications   Prior to Admission medications   Medication Sig Start  Date End Date Taking? Authorizing Provider  ADVAIR DISKUS 500-50 MCG/DOSE AEPB INHALE 1 PUFF INTO LUNGS TWICE A DAY 05/04/13   Hilarie FredricksonAmber M Hairford, MD  albuterol (PROAIR HFA) 108 (90 BASE) MCG/ACT inhaler INHALE 2 PUFFS INTO THE LUNGS EVERY 4 (FOUR) HOURS AS NEEDED FOR WHEEZING. 10/19/13   Amber Nydia BoutonM Hairford, MD  fluticasone (FLONASE) 50 MCG/ACT nasal spray USE 2 SPRAYS INTO THE NOSE DAILY. 05/04/13   Amber Nydia BoutonM Hairford, MD  levocetirizine (XYZAL) 5 MG tablet TAKE 1 TABLET (5 MG TOTAL) BY MOUTH EVERY EVENING. 07/09/13   Hilarie FredricksonAmber M Hairford, MD  lisinopril-hydrochlorothiazide (PRINZIDE,ZESTORETIC) 20-25 MG per tablet TAKE 1 TABLET EVERY DAY 06/25/13   Amber Nydia BoutonM Hairford, MD  NOHIST-DM 06-23-14 MG/5ML LIQD TAKE 1 TEASPOONFUL BY MOUTH TWICE A DAY AS NEEDED 12/31/13   Hilarie FredricksonAmber M Hairford, MD  trazodone (DESYREL) 300 MG tablet TAKE 1 TABLET AT BEDTIME AS NEEDED FOR SLEEP. 05/29/13   Hilarie FredricksonAmber M Hairford, MD   BP 126/82  Pulse 89  Temp(Src) 97 F (36.1 C) (Oral)  Resp 16  SpO2 100% Physical Exam  Nursing note and vitals reviewed. Constitutional: She is oriented to person, place, and time. Vital signs are normal. She appears well-developed and well-nourished. No distress.  HENT:  Head: Normocephalic and atraumatic.  Right Ear: Tympanic membrane, external ear and ear canal normal.  Left Ear: Tympanic membrane, external ear and ear canal normal.  Nose: Mucosal edema and rhinorrhea present. Right sinus exhibits no maxillary sinus tenderness and no frontal  sinus tenderness. Left sinus exhibits no maxillary sinus tenderness and no frontal sinus tenderness.  Mouth/Throat: Uvula is midline, oropharynx is clear and moist and mucous membranes are normal. No oropharyngeal exudate.  Eyes: Conjunctivae are normal. Right eye exhibits no discharge. Left eye exhibits no discharge.  Neck: Normal range of motion. Neck supple. No tracheal deviation present.  Cardiovascular: Normal rate, regular rhythm and normal heart sounds.    Pulmonary/Chest: Effort normal. Not tachypneic. No respiratory distress. She has wheezes (diffuse).  Lymphadenopathy:    She has no cervical adenopathy.  Neurological: She is alert and oriented to person, place, and time. She has normal strength. Coordination normal.  Skin: Skin is warm and dry. No rash noted. She is not diaphoretic.  Psychiatric: She has a normal mood and affect. Judgment normal.    ED Course  Procedures (including critical care time) Labs Review Labs Reviewed - No data to display   Imaging Review No results found.  Will give breathing treatment and reassess    MDM   1. Hay fever with asthma    Still mild wheezing after second breathing treatment. We'll treat with prednisone, continue Allegra and albuterol as needed, doxycycline to cover atypical infection. Followup when necessary if not improving.   Meds ordered this encounter  Medications  . ipratropium-albuterol (DUONEB) 0.5-2.5 (3) MG/3ML nebulizer solution 6 mL    Sig:   . albuterol (PROVENTIL) (2.5 MG/3ML) 0.083% nebulizer solution 5 mg    Sig:   . predniSONE (DELTASONE) 50 MG tablet    Sig: Take 1 tablet (50 mg total) by mouth daily with breakfast.    Dispense:  5 tablet    Refill:  0    Order Specific Question:  Supervising Provider    Answer:  Linna HoffKINDL, JAMES D [phone][[id number]]  . doxycycline (VIBRAMYCIN) 100 MG capsule    Sig: Take 1 capsule (100 mg total) by mouth 2 (two) times daily.    Dispense:  14 capsule    Refill:  0    Order Specific Question:  Supervising Provider    Answer:  Bradd CanaryKINDL, JAMES D [[id number]]       Graylon GoodZachary H Porfiria Heinrich, PA-C 01/07/14 1806

## 2014-01-07 NOTE — ED Notes (Signed)
This pt called in all waiting areas - no response.

## 2014-01-07 NOTE — ED Notes (Signed)
Pt c/o cold sx onset 3 weeks Sx include productive cough, sneezing, wheezing Denies f/v/n/d... Hx of asthma Alert w/no signs of acute distress.

## 2014-01-07 NOTE — Discharge Instructions (Signed)
Asthma, Acute Bronchospasm °Acute bronchospasm caused by asthma is also referred to as an asthma attack. Bronchospasm means your air passages become narrowed. The narrowing is caused by inflammation and tightening of the muscles in the air tubes (bronchi) in your lungs. This can make it hard to breath or cause you to wheeze and cough. °CAUSES °Possible triggers are: °· Animal dander from the skin, hair, or feathers of animals. °· Dust mites contained in house dust. °· Cockroaches. °· Pollen from trees or grass. °· Mold. °· Cigarette or tobacco smoke. °· Air pollutants such as dust, household cleaners, hair sprays, aerosol sprays, paint fumes, strong chemicals, or strong odors. °· Cold air or weather changes. Cold air may trigger inflammation. Winds increase molds and pollens in the air. °· Strong emotions such as crying or laughing hard. °· Stress. °· Certain medicines such as aspirin or beta-blockers. °· Sulfites in foods and drinks, such as dried fruits and wine. °· Infections or inflammatory conditions, such as a flu, cold, or inflammation of the nasal membranes (rhinitis). °· Gastroesophageal reflux disease (GERD). GERD is a condition where stomach acid backs up into your throat (esophagus). °· Exercise or strenuous activity. °SIGNS AND SYMPTOMS  °· Wheezing. °· Excessive coughing, particularly at night. °· Chest tightness. °· Shortness of breath. °DIAGNOSIS  °Your health care provider will ask you about your medical history and perform a physical exam. A chest X-ray or blood testing may be performed to look for other causes of your symptoms or other conditions that may have triggered your asthma attack.  °TREATMENT  °Treatment is aimed at reducing inflammation and opening up the airways in your lungs.  Most asthma attacks are treated with inhaled medicines. These include quick relief or rescue medicines (such as bronchodilators) and controller medicines (such as inhaled corticosteroids). These medicines are  sometimes given through an inhaler or a nebulizer. Systemic steroid medicine taken by mouth or given through an IV tube also can be used to reduce the inflammation when an attack is moderate or severe. Antibiotic medicines are only used if a bacterial infection is present.  °HOME CARE INSTRUCTIONS  °· Rest. °· Drink plenty of liquids. This helps the mucus to remain thin and be easily coughed up. Only use caffeine in moderation and do not use alcohol until you have recovered from your illness. °· Do not smoke. Avoid being exposed to secondhand smoke. °· You play a critical role in keeping yourself in good health. Avoid exposure to things that cause you to wheeze or to have breathing problems. °· Keep your medicines up to date and available. Carefully follow your health care provider's treatment plan. °· Take your medicine exactly as prescribed. °· When pollen or pollution is bad, keep windows closed and use an air conditioner or go to places with air conditioning. °· Asthma requires careful medical care. See your health care provider for a follow-up as advised. If you are more than [redacted] weeks pregnant and you were prescribed any new medicines, let your obstetrician know about the visit and how you are doing. Follow-up with your health care provider as directed. °· After you have recovered from your asthma attack, make an appointment with your outpatient doctor to talk about ways to reduce the likelihood of future attacks. If you do not have a doctor who manages your asthma, make an appointment with a primary care doctor to discuss your asthma. °SEEK IMMEDIATE MEDICAL CARE IF:  °· You are getting worse. °· You have trouble breathing. If severe, call   your local emergency services (911 in the [place] °· You develop chest pain or discomfort. °· You are vomiting. °· You are not able to keep fluids down. °· You are coughing up yellow, green, brown, or bloody sputum. °· You have a fever and your symptoms suddenly get  worse. °· You have trouble swallowing. °MAKE SURE YOU:  °· Understand these instructions. °· Will watch your condition. °· Will get help right away if you are not doing well or get worse. °Document Released: 12/22/2006 Document Revised: 05/09/2013 Document Reviewed: 03/14/2013 °ExitCare® Patient Information ©2014 [employer]. ° °

## 2014-01-07 NOTE — Telephone Encounter (Signed)
[facility] called needing NPI number; number given.  Clovis Puamika L Martin, RN

## 2014-01-11 NOTE — ED Provider Notes (Signed)
Medical screening examination/treatment/procedure(s) were performed by resident physician or non-physician practitioner and as supervising physician I was immediately available for consultation/collaboration.   Merranda Bolls DOUGLAS MD.   Lakesha Levinson D Yeslin Delio, MD 01/11/14 1030 

## 2014-03-05 ENCOUNTER — Other Ambulatory Visit: Payer: Self-pay | Admitting: Family Medicine

## 2014-03-05 NOTE — Telephone Encounter (Signed)
Patient's mother waiting for refill to be processed to pharmacy.

## 2014-03-06 ENCOUNTER — Other Ambulatory Visit: Payer: Self-pay | Admitting: Family Medicine

## 2014-03-06 MED ORDER — LEVOCETIRIZINE DIHYDROCHLORIDE 5 MG PO TABS: ORAL_TABLET | ORAL | Status: DC

## 2014-03-06 NOTE — Telephone Encounter (Signed)
Rx sent yesterday.  I sent it again today.  Amber M. Hairford, M.D.

## 2014-03-16 ENCOUNTER — Other Ambulatory Visit: Payer: Self-pay | Admitting: Family Medicine

## 2014-03-20 ENCOUNTER — Other Ambulatory Visit: Payer: Self-pay | Admitting: Family Medicine

## 2014-04-03 ENCOUNTER — Other Ambulatory Visit: Payer: Self-pay | Admitting: Family Medicine

## 2014-05-02 DIAGNOSIS — F79 Unspecified intellectual disabilities: Secondary | ICD-10-CM | POA: Diagnosis not present

## 2014-05-16 DIAGNOSIS — F79 Unspecified intellectual disabilities: Secondary | ICD-10-CM | POA: Diagnosis not present

## 2014-06-04 ENCOUNTER — Telehealth: Payer: Self-pay | Admitting: Obstetrics and Gynecology

## 2014-06-04 ENCOUNTER — Other Ambulatory Visit: Payer: Self-pay | Admitting: *Deleted

## 2014-06-04 DIAGNOSIS — L989 Disorder of the skin and subcutaneous tissue, unspecified: Secondary | ICD-10-CM

## 2014-06-04 MED ORDER — FLUTICASONE PROPIONATE 50 MCG/ACT NA SUSP: NASAL | Status: DC

## 2014-06-04 NOTE — Telephone Encounter (Signed)
Nurse Aide called for Alyssa Powell and would like a referral to go to the [facility]. jw

## 2014-06-04 NOTE — Telephone Encounter (Signed)
Closed in Error. Nurse Aide called and would like a referral to go to the [facility]. jw

## 2014-06-04 NOTE — Telephone Encounter (Signed)
Will forward to MD to place referral. Jazmin Hartsell,CMA  

## 2014-06-05 NOTE — Telephone Encounter (Signed)
Called number located in file. Spoke to patient's mother. I told her I have not seen the patient so referral to foot specialist may not go through without her having been evaluated. Patient goes to a day center and has a nurse Aide who called requesting this referral. From what I understand they just needed a doctor to make the referral. I believe patient has Medicaid.

## 2014-06-05 NOTE — Addendum Note (Signed)
Addended by: Pincus Large on: 06/05/2014 11:33 AM   Modules accepted: Orders

## 2014-06-14 ENCOUNTER — Ambulatory Visit: Payer: Medicare Other | Admitting: Podiatry

## 2014-07-16 ENCOUNTER — Encounter: Payer: Medicare Other | Admitting: Family Medicine

## 2014-07-22 ENCOUNTER — Encounter: Payer: Medicare Other | Admitting: Family Medicine

## 2014-07-29 ENCOUNTER — Ambulatory Visit (INDEPENDENT_AMBULATORY_CARE_PROVIDER_SITE_OTHER): Payer: Medicare Other | Admitting: Family Medicine

## 2014-07-29 ENCOUNTER — Encounter: Payer: Self-pay | Admitting: Family Medicine

## 2014-07-29 VITALS — BP 118/69 | HR 67 | Temp 98.2°F | Ht 64.0 in | Wt 214.3 lb

## 2014-07-29 DIAGNOSIS — B309 Viral conjunctivitis, unspecified: Secondary | ICD-10-CM | POA: Diagnosis not present

## 2014-07-29 MED ORDER — OLOPATADINE HCL 0.1 % OP SOLN: 1.0000 [drp] | Freq: Two times a day (BID) | OPHTHALMIC | Status: DC

## 2014-07-29 NOTE — Patient Instructions (Signed)
Viral Conjunctivitis Conjunctivitis is an irritation (inflammation) of the clear membrane that covers the white part of the eye (the conjunctiva). The irritation can also happen on the underside of the eyelids. Conjunctivitis makes the eye red or pink in color. This is what is commonly known as pink eye. Viral conjunctivitis can spread easily (contagious). CAUSES   Infection from virus on the surface of the eye.  Infection from the irritation or injury of nearby tissues such as the eyelids or cornea.  More serious inflammation or infection on the inside of the eye.  Other eye diseases.  The use of certain eye medications. SYMPTOMS  The normally white color of the eye or the underside of the eyelid is usually pink or red in color. The pink eye is usually associated with irritation, tearing and some sensitivity to light. Viral conjunctivitis is often associated with a clear, watery discharge. If a discharge is present, there may also be some blurred vision in the affected eye. DIAGNOSIS  Conjunctivitis is diagnosed by an eye exam. The eye specialist looks for changes in the surface tissues of the eye which take on changes characteristic of the specific types of conjunctivitis. A sample of any discharge may be collected on a Q-Tip (sterile swap). The sample will be sent to a lab to see whether or not the inflammation is caused by bacterial or viral infection. TREATMENT  Viral conjunctivitis will not respond to medicines that kill germs (antibiotics). Treatment is aimed at stopping a bacterial infection on top of the viral infection. The goal of treatment is to relieve symptoms (such as itching) with antihistamine drops or other eye medications.  HOME CARE INSTRUCTIONS   To ease discomfort, apply a cool, clean wash cloth to your eye for 10 to 20 minutes, 3 to 4 times a day.  Gently wipe away any drainage from the eye with a warm, wet washcloth or a cotton ball.  Wash your hands often with soap  and use paper towels to dry.  Do not share towels or washcloths. This may spread the infection.  Change or wash your pillowcase every day.  You should not use eye make-up until the infection is gone.  Stop using contacts lenses. Ask your eye professional how to sterilize or replace them before using again. This depends on the type of contact lenses used.  Do not touch the edge of the eyelid with the eye drop bottle or ointment tube when applying medications to the affected eye. This will stop you from spreading the infection to the other eye or to others. SEEK IMMEDIATE MEDICAL CARE IF:   The infection has not improved within 3 days of beginning treatment.  A watery discharge from the eye develops.  Pain in the eye increases.  The redness is spreading.  Vision becomes blurred.  An oral temperature above 102 F (38.9 C) develops, or as your caregiver suggests.  Facial pain, redness or swelling develops.  Any problems that may be related to the prescribed medicine develop. MAKE SURE YOU:   Understand these instructions.  Will watch your condition.  Will get help right away if you are not doing well or get worse. Document Released: 09/06/2005 Document Revised: 11/29/2011 Document Reviewed: 04/25/2008 ExitCare Patient Information 2015 [employer]. This information is not intended to replace advice given to you by your health care provider. Make sure you discuss any questions you have with your health care provider.  

## 2014-07-29 NOTE — Progress Notes (Signed)
  Subjective:    Alyssa Powell is a 63 y.o. female who presents for evaluation of itching in the right eye. She has noticed the above symptoms for 2 days. Onset was gradual. Patient denies blurred vision, discharge, foreign body sensation, photophobia, tearing and visual field deficit. There is a history of N/A.  Denies contacts, sick contacts, fever, chills, sweats, or HA.  The following portions of the patient's history were reviewed and updated as appropriate: allergies, current medications, past family history, past medical history, past social history, past surgical history and problem list.  Review of Systems Pertinent items are noted in HPI.   Objective:    BP 118/69 mmHg  Pulse 67  Temp(Src) 98.2 F (36.8 C) (Oral)  Ht 5\' 4"  (1.626 m)  Wt 214 lb 4.8 oz (97.206 kg)  BMI 36.77 kg/m2      General: alert, cooperative and appears stated age  Eyes:  Conjunctival injection R eye of the bulbar conjunctiva.  PERRLA B/L, EOMI B/L, no visual field deficits      Assessment:    Acute conjunctivitis   Plan:    Discussed the diagnosis and proper care of conjunctivitis.  Stressed household Presenter, broadcastinghygiene. Antihistamines per orders. Warm compress to eye(s). Local eye care discussed. Analgesics as needed. FU here in a few days or PRN.

## 2014-07-31 ENCOUNTER — Telehealth: Payer: Self-pay | Admitting: *Deleted

## 2014-07-31 ENCOUNTER — Telehealth: Payer: Self-pay | Admitting: Family Medicine

## 2014-07-31 MED ORDER — OLOPATADINE HCL 0.2 % OP SOLN: OPHTHALMIC | Status: DC

## 2014-07-31 NOTE — Telephone Encounter (Addendum)
Received [facility] from [facility] stating that Patanol 0.1% eye drops are not available.  Please change to Pataday or Paxeo.  Clovis PuMartin, Jennette Leask L, RN   Pataday 0.2% eye drops, 1 gtt OU QD called in for pt.    Thanks Tesoro CorporationBryan R. Paulina FusiHess, DO of [facility] 07/31/2014, 3:04 PM

## 2014-07-31 NOTE — Telephone Encounter (Signed)
Explained to mom that new Rx was sent today @ 3:05pm.  Advised to give it about an hour and call them back to see if it is ready.  Mom agreeable.  [name], Maryjo RochesterJessica Powell

## 2014-07-31 NOTE — Telephone Encounter (Signed)
Patient's mother request another prescription for pin eye since [facility] didn't have the prescription. Please follow up soon.

## 2014-08-05 ENCOUNTER — Ambulatory Visit (INDEPENDENT_AMBULATORY_CARE_PROVIDER_SITE_OTHER): Payer: Medicare Other | Admitting: *Deleted

## 2014-08-05 ENCOUNTER — Ambulatory Visit (INDEPENDENT_AMBULATORY_CARE_PROVIDER_SITE_OTHER): Payer: Medicare Other | Admitting: Obstetrics and Gynecology

## 2014-08-05 ENCOUNTER — Encounter: Payer: Self-pay | Admitting: Obstetrics and Gynecology

## 2014-08-05 VITALS — BP 122/80 | HR 65 | Temp 97.6°F | Ht 64.0 in | Wt 213.0 lb

## 2014-08-05 DIAGNOSIS — R450 Nervousness: Secondary | ICD-10-CM

## 2014-08-05 DIAGNOSIS — Z23 Encounter for immunization: Secondary | ICD-10-CM | POA: Diagnosis not present

## 2014-08-05 DIAGNOSIS — R05 Cough: Secondary | ICD-10-CM | POA: Diagnosis not present

## 2014-08-05 DIAGNOSIS — I10 Essential (primary) hypertension: Secondary | ICD-10-CM

## 2014-08-05 DIAGNOSIS — R059 Cough, unspecified: Secondary | ICD-10-CM

## 2014-08-05 DIAGNOSIS — B309 Viral conjunctivitis, unspecified: Secondary | ICD-10-CM | POA: Diagnosis not present

## 2014-08-05 MED ORDER — OMEPRAZOLE 20 MG PO CPDR: 20.0000 mg | DELAYED_RELEASE_CAPSULE | Freq: Every day | ORAL | Status: DC

## 2014-08-05 MED ORDER — SERTRALINE HCL 50 MG PO TABS: 50.0000 mg | ORAL_TABLET | Freq: Every day | ORAL | Status: DC

## 2014-08-05 NOTE — Patient Instructions (Signed)
Ms. Easterday it was grClearance [place] to see you today!  I am pleased to hear that things are going well for you.  Here are some of the things we discussed today: - New medication for anxiety. Please let me know how this works -PPI for night time cough. Trial before changing BP meds. Not sure they are the cause. -Forms will be filled out and faxed -Shower chair will be faxed.     Please schedule a follow-up appointment for 2 months.   Thanks for allowing me to be a part of your care! Dr. Doroteo GlassmanPhelps

## 2014-08-05 NOTE — Progress Notes (Signed)
     Subjective: Chief Complaint  Patient presents with  . Annual Exam  . Hypertension  . Conjunctivitis    seen on 07-29-14   History taking is limited. Patient with MR and caregiver(mother) not at appointment but spoke to her over phone. Niece was at visit.    HPI: Alyssa Powell is a 63 y.o. presenting to clinic today to discuss the following:  #Anxiety - Patient's mother states that she just appears more anxious and nervous now. Sometimes she works herself up into an asthma attack.   #Hypertension Blood pressure today: 122/80 Taking Meds: Yes  Side effects: Possible cough. Mother says that she has a cough and that Alyssa Powell has a cough too. Her doctor is switching her medications and wants Alyssa Powell to have hers switched.  ROS: Denies headache, dizziness, visual changes, nausea, vomiting, chest pain, abdominal pain or shortness of breath.  #Arthritis - patient is having more problems with her arthritis. Unable to straighten fingers out sometimes. Right arm pain as well.   #Conjunctivitis - Cleared.   Social - Patient with MR. Lives with mother as caregiver and attends day center. Nonsmoker.   All systems were reviewed and were negative unless otherwise noted in the HPI Past Medical, Surgical, Social, and Family History Reviewed & Updated per EMR.   Objective: BP 122/80 mmHg  Pulse 65  Temp(Src) 97.6 F (36.4 C) (Oral)  Ht 5\' 4"  (1.626 m)  Wt 213 lb (96.616 kg)  BMI 36.54 kg/m2  General: alert, well-developed, NAD, cooperative Eyes: vision grossly intact, no injection and anicteric. Extraocular motion intact.  Lungs: CTAB, normal respiratory effort, no crackles, and no wheezes.  Heart: RRR, no M/R/G.  Abdomen: Bowel sounds normal; abdomen soft and nontender. Msk: no joint swelling, no joint warmth, and no redness over joints. Range of motion normal .Tone & bulk normal. Extremities: No cyanosis, clubbing, edema Neurologic: No focal deficits, 4+ strength globally, sensation  grossly intact.  Psych: Mood and affect are normal; no evidence of anxiety or depression.  Assessment/Plan: Please see problem based Assessment and Plan  Health Maintainance: flu shot given today  Faxed over forms for day center and shower chair.    Caryl AdaJazma Kensington Rios, DO 08/05/2014, 3:59 PM PGY-1, [facility]

## 2014-08-06 NOTE — Assessment & Plan Note (Signed)
A: Patient non-anxious. Mood and affect appropriate. P: Unsure what family is experiencing at home. However this has been brought up previously. Patient not a good candidate for BZDs. Patient given a trial of SSRI to see if it will help decrease nervousness. Will f/u in a month.

## 2014-08-06 NOTE — Assessment & Plan Note (Signed)
No concerns for infection or asthma causing cough. No cough present during visit. Patient given a trial of PPI. Will follow and reassess at next visit.

## 2014-08-06 NOTE — Assessment & Plan Note (Signed)
Stable. Patient to continue lisinopril-hctz. Will reassess cough at next visit. Consider switching medication if persists but not confident it is due to lisinopril since patient been on medication for a while now with no cough.

## 2014-08-08 ENCOUNTER — Telehealth: Payer: Self-pay | Admitting: Obstetrics and Gynecology

## 2014-08-08 NOTE — Telephone Encounter (Signed)
I put the Rx for the shower chair in the to be faxed pile at her last office visit (Dr. Randolm IdolFletke co-signed). I put a [facility] to [facility]. As far as I know it was faxed along with her form. I do not know the turn around time for getting equipment.

## 2014-08-08 NOTE — Telephone Encounter (Signed)
Mother calling to check the status of the shower chair that was suppose to be ordered for patient. Please call her.

## 2014-08-08 NOTE — Telephone Encounter (Signed)
Will forward to MD to check the status of this order. Jazmin Hartsell,CMA

## 2014-08-09 ENCOUNTER — Telehealth: Payer: Self-pay | Admitting: Obstetrics and Gynecology

## 2014-08-09 NOTE — Telephone Encounter (Signed)
Alyssa Powell called and she did call [facility] to check on the fax we sent for Alyssa Powell to have a shower chair. [facility] said that they have never received a fax for this. Can we re-fax this to [phone]. jw

## 2014-08-09 NOTE — Telephone Encounter (Signed)
Received fax back.  Needed demographics, Dx code and length of use on Rx.  Completed and faxed back. Isel Skufca, Maryjo RochesterJessica Dawn

## 2014-08-09 NOTE — Telephone Encounter (Signed)
Mother Corrie DandyMary is aware [facility]  She will call them and check on the status. Oakley Orban,CMA

## 2014-08-13 ENCOUNTER — Other Ambulatory Visit: Payer: Self-pay | Admitting: *Deleted

## 2014-08-13 MED ORDER — TRAZODONE HCL 300 MG PO TABS: ORAL_TABLET | ORAL | Status: DC

## 2014-09-02 ENCOUNTER — Telehealth: Payer: Self-pay | Admitting: Obstetrics and Gynecology

## 2014-09-02 NOTE — Telephone Encounter (Signed)
Mary/Mother checking status of orders MD was suppose to give [facility] about pt receiving meds at a certain time, says she talked with someone about this last week.

## 2014-09-02 NOTE — Telephone Encounter (Signed)
Will forward to MD. Elvia Aydin,CMA  

## 2014-09-03 NOTE — Telephone Encounter (Signed)
Form was completed last week [facility] I believe this was Friday. If by Thursday/Friay form not at center or located will refill out form again.

## 2014-09-06 ENCOUNTER — Emergency Department (HOSPITAL_COMMUNITY): Payer: Medicare Other

## 2014-09-06 ENCOUNTER — Emergency Department (HOSPITAL_COMMUNITY)
Admission: EM | Admit: 2014-09-06 | Discharge: 2014-09-06 | Disposition: A | Discharge status: Home / Self Care | Payer: Medicare Other | Attending: Emergency Medicine | Admitting: Emergency Medicine

## 2014-09-06 ENCOUNTER — Encounter (HOSPITAL_COMMUNITY): Payer: Self-pay | Admitting: *Deleted

## 2014-09-06 DIAGNOSIS — Z7951 Long term (current) use of inhaled steroids: Secondary | ICD-10-CM | POA: Insufficient documentation

## 2014-09-06 DIAGNOSIS — Z79899 Other long term (current) drug therapy: Secondary | ICD-10-CM | POA: Diagnosis not present

## 2014-09-06 DIAGNOSIS — F79 Unspecified intellectual disabilities: Secondary | ICD-10-CM | POA: Diagnosis not present

## 2014-09-06 DIAGNOSIS — I10 Essential (primary) hypertension: Secondary | ICD-10-CM | POA: Insufficient documentation

## 2014-09-06 DIAGNOSIS — R062 Wheezing: Secondary | ICD-10-CM | POA: Diagnosis present

## 2014-09-06 DIAGNOSIS — J45909 Unspecified asthma, uncomplicated: Secondary | ICD-10-CM | POA: Diagnosis not present

## 2014-09-06 DIAGNOSIS — Z7952 Long term (current) use of systemic steroids: Secondary | ICD-10-CM | POA: Insufficient documentation

## 2014-09-06 DIAGNOSIS — J45901 Unspecified asthma with (acute) exacerbation: Secondary | ICD-10-CM

## 2014-09-06 DIAGNOSIS — Z880 Allergy status to penicillin: Secondary | ICD-10-CM | POA: Insufficient documentation

## 2014-09-06 MED ORDER — IPRATROPIUM BROMIDE 0.02 % IN SOLN
0.5000 mg | Freq: Once | RESPIRATORY_TRACT | Status: AC
  Administered 2014-09-06: 0.5 mg via RESPIRATORY_TRACT
  Filled 2014-09-06: qty 2.5

## 2014-09-06 MED ORDER — PREDNISONE 20 MG PO TABS: ORAL_TABLET | ORAL | Status: DC

## 2014-09-06 MED ORDER — ALBUTEROL SULFATE (2.5 MG/3ML) 0.083% IN NEBU
5.0000 mg | INHALATION_SOLUTION | Freq: Once | RESPIRATORY_TRACT | Status: AC
  Administered 2014-09-06: 5 mg via RESPIRATORY_TRACT
  Filled 2014-09-06: qty 6

## 2014-09-06 MED ORDER — PREDNISONE 20 MG PO TABS
60.0000 mg | ORAL_TABLET | Freq: Once | ORAL | Status: AC
  Administered 2014-09-06: 60 mg via ORAL
  Filled 2014-09-06: qty 3

## 2014-09-06 NOTE — ED Notes (Signed)
Pt breathing is equal and unlabored at this time.

## 2014-09-06 NOTE — Discharge Instructions (Signed)

## 2014-09-06 NOTE — ED Provider Notes (Signed)
CSN: [id number]     Arrival date & time 09/06/14  1501 History  This chart was scribed for Fayrene HelperBowie Denell Cothern, PA-C, working with Purvis SheffieldForrest Harrison, MD found by Elon SpannerGarrett Cook, ED Scribe. This patient was seen in room WTR7/WTR7 and the patient's care was started at 3:40 PM.   Chief Complaint  Patient presents with  . Asthma    The history is provided by the patient. No language interpreter was used.    HPI Comments: Alyssa Powell is a 63 y.o. female with a history of asthma who presents to the Emergency Department complaining of an exacerbation of SOB with associated congestion and cough productive of phlegm onset 3 days ago.  Patient uses ventolin, advair, allegra, with moderate increased used today.  Patient denies fever, chill, body aches. Pt without cp, abd pain, lightheadedness, dizziness, or rash.  Sts she has been wheezing more than usual. No prior hx of PE/DVT, no recent surgery, prolonged bedrest, leg swelling or calf pain.      Past Medical History  Diagnosis Date  . Allergy   . Asthma   . Hypertension   . Mental retardation    History reviewed. No pertinent past surgical history. History reviewed. No pertinent family history. History  Substance Use Topics  . Smoking status: Never Smoker   . Smokeless tobacco: Not on file  . Alcohol Use: No   OB History    No data available     Review of Systems  Constitutional: Negative for fever.  HENT: Positive for congestion.   Respiratory: Positive for cough and wheezing.       Allergies  Amoxicillin and Codeine phosphate  Home Medications   Prior to Admission medications   Medication Sig Start Date End Date Taking? Authorizing Provider  ADVAIR DISKUS 500-50 MCG/DOSE AEPB INHALE 1 PUFF INTO LUNGS TWICE A DAY 04/03/14   Pincus LargeJazma Y Phelps, DO  doxycycline (VIBRAMYCIN) 100 MG capsule Take 1 capsule (100 mg total) by mouth 2 (two) times daily. 01/07/14   Adrian BlackwaterZachary H Baker, PA-C  fluticasone (FLONASE) 50 MCG/ACT nasal spray USE 2 SPRAYS INTO  THE NOSE DAILY. 06/04/14   Pincus LargeJazma Y Phelps, DO  levocetirizine (XYZAL) 5 MG tablet TAKE 1 TABLET (5 MG TOTAL) BY MOUTH EVERY EVENING. 03/06/14   Hilarie FredricksonAmber M Hairford, MD  lisinopril-hydrochlorothiazide (PRINZIDE,ZESTORETIC) 20-25 MG per tablet TAKE 1 TABLET EVERY DAY    Amber Nydia BoutonM Hairford, MD  NOHIST-DM 06-23-14 MG/5ML LIQD TAKE 1 TEASPOONFUL BY MOUTH TWICE A DAY AS NEEDED 12/31/13   Hilarie FredricksonAmber M Hairford, MD  Olopatadine HCl 0.2 % SOLN 1 gtt OU QD 07/31/14   Bryan R Hess, DO  omeprazole (PRILOSEC) 20 MG capsule Take 1 capsule (20 mg total) by mouth daily. 08/05/14   Pincus LargeJazma Y Phelps, DO  predniSONE (DELTASONE) 50 MG tablet Take 1 tablet (50 mg total) by mouth daily with breakfast. 01/07/14   Graylon GoodZachary H Baker, PA-C  PROAIR HFA 108 (90 BASE) MCG/ACT inhaler INHALE 2 PUFFS INTO THE LUNGS EVERY 4 (FOUR) HOURS AS NEEDED FOR WHEEZING. 03/20/14   Pincus LargeJazma Y Phelps, DO  sertraline (ZOLOFT) 50 MG tablet Take 1 tablet (50 mg total) by mouth daily. 08/05/14   Pincus LargeJazma Y Phelps, DO  trazodone (DESYREL) 300 MG tablet TAKE 1 TABLET AT BEDTIME AS NEEDED FOR SLEEP. 08/13/14   Pincus LargeJazma Y Phelps, DO   BP 128/74 mmHg  Pulse 62  Temp(Src) 97.6 F (36.4 C) (Oral)  Resp 17  SpO2 97% Physical Exam  Pulmonary/Chest:  Faint expiriatory wheezes heard  throughout.  No rhonchi or rales.  Oropharyngeal mild erythematous.  No trismus.     ED Course  Procedures (including critical care time)  DIAGNOSTIC STUDIES: Oxygen Saturation is 97% on RA, normal by my interpretation.    COORDINATION OF CARE:  3:47 PM Will order CXR and breathing treatment.  Patient acknowledges and agrees with plan.    5:17 PM Wheezing improves with duonebs.  CXR without acute infiltrates.  She is afebrile, vss.  Ambulate while maintaining normal O2.  Will d/c with steroid course and have pt f/u with PCP for further care. She has maintenance and rescue inhaler at home for use.    Labs Review Labs Reviewed - No data to display  Imaging Review Dg Chest 2  View  09/06/2014   CLINICAL DATA:  Wheezing, recent asthma attack  EXAM: CHEST  2 VIEW  COMPARISON:  09/15/2008  FINDINGS: The heart size and mediastinal contours are within normal limits. Both lungs are clear. The visualized skeletal structures are unremarkable.  IMPRESSION: No active cardiopulmonary disease.   Electronically Signed   By: Alcide CleverMark  Lukens M.D.   On: 09/06/2014 16:54     EKG Interpretation None      MDM   Final diagnoses:  Wheeze  Asthma attack    BP 128/74 mmHg  Pulse 62  Temp(Src) 97.6 F (36.4 C) (Oral)  Resp 17  SpO2 98%  I have reviewed nursing notes and vital signs. I personally reviewed the imaging tests through PACS system  I reviewed available ER/hospitalization records thought the EMR   I personally performed the services described in this documentation, which was scribed in my presence. The recorded information has been reviewed and is accurate.     Fayrene HelperBowie Kimi Bordeau, PA-C 09/06/14 1721  Purvis SheffieldForrest Harrison, MD 09/07/14 1155

## 2014-09-06 NOTE — ED Notes (Signed)
Pt hx mental retardation, pts guardians are here with pt. Report pt had asthma attack earlier today. Use 1 inhaler. Denies pain. Slight expiratory wheezing heard.

## 2014-09-06 NOTE — ED Notes (Signed)
CXR delay from radiology.  Notified PA

## 2014-09-17 ENCOUNTER — Other Ambulatory Visit: Payer: Self-pay | Admitting: *Deleted

## 2014-09-18 ENCOUNTER — Other Ambulatory Visit: Payer: Self-pay | Admitting: *Deleted

## 2014-09-18 NOTE — Telephone Encounter (Signed)
LM for mother Ernestine McmurrayMary Heaslip to call back.  (on hipaa)  Please assist her in scheduling appt for medication refill.  Thanks Limited BrandsJazmin Hartsell,CMA

## 2014-09-18 NOTE — Telephone Encounter (Signed)
Please let the patient know that she must have an office visit for a refill of this medication.  Thanks, Alyssa Puffrystal S. Sacoya Mcgourty, MD [facility] Resident  09/18/2014, 1:31 AM

## 2014-09-18 NOTE — Telephone Encounter (Signed)
Please let the patient know she must come in for an office visit prior to additional refills (this was documented by Dr. Doroteo GlassmanPhelps when she prescribed the Trazodone in November).  Thanks, Alyssa Puffrystal S. Mendy Lapinsky, MD [facility] Resident  09/18/2014, 5:05 PM

## 2014-09-18 NOTE — Telephone Encounter (Signed)
Trazodone  °

## 2014-10-19 ENCOUNTER — Other Ambulatory Visit: Payer: Self-pay | Admitting: Obstetrics and Gynecology

## 2014-10-21 ENCOUNTER — Telehealth: Payer: Self-pay | Admitting: Obstetrics and Gynecology

## 2014-10-21 NOTE — Telephone Encounter (Signed)
Will forward to MD.  This would also require a written order. Jazmin Hartsell,CMA

## 2014-10-21 NOTE — Telephone Encounter (Signed)
Please give the nurse at the adult center to give the center permission to give Zoloft during the day

## 2014-10-21 NOTE — Telephone Encounter (Signed)
Mother of patient will resend a form to be filled out. I filled the form out last month but guess it never got sent. Will sign form when received.

## 2014-12-12 ENCOUNTER — Other Ambulatory Visit: Payer: Self-pay | Admitting: Obstetrics and Gynecology

## 2014-12-17 ENCOUNTER — Other Ambulatory Visit: Payer: Self-pay | Admitting: Obstetrics and Gynecology

## 2014-12-27 ENCOUNTER — Telehealth: Payer: Self-pay | Admitting: Obstetrics and Gynecology

## 2014-12-27 NOTE — Telephone Encounter (Signed)
Mother is returning Dr. Doroteo GlassmanPhelps phone call about her daughter. jw

## 2014-12-30 ENCOUNTER — Other Ambulatory Visit: Payer: Self-pay | Admitting: Obstetrics and Gynecology

## 2014-12-30 MED ORDER — MELOXICAM 7.5 MG PO TABS: 7.5000 mg | ORAL_TABLET | Freq: Every day | ORAL | Status: DC

## 2014-12-30 NOTE — Telephone Encounter (Signed)
Called to speak to Alyssa Powell's mother who is her caregiver. She was requesting refill of patient's Meloxicam for her arthritis. Refill prescribed.

## 2015-01-08 ENCOUNTER — Other Ambulatory Visit: Payer: Self-pay | Admitting: Obstetrics and Gynecology

## 2015-02-15 ENCOUNTER — Other Ambulatory Visit: Payer: Self-pay | Admitting: Obstetrics and Gynecology

## 2015-02-18 ENCOUNTER — Other Ambulatory Visit: Payer: Self-pay | Admitting: Obstetrics and Gynecology

## 2015-03-20 ENCOUNTER — Other Ambulatory Visit: Payer: Self-pay | Admitting: *Deleted

## 2015-03-21 MED ORDER — LEVOCETIRIZINE DIHYDROCHLORIDE 5 MG PO TABS: ORAL_TABLET | ORAL | Status: DC

## 2015-03-31 ENCOUNTER — Other Ambulatory Visit: Payer: Self-pay | Admitting: Obstetrics and Gynecology

## 2015-05-04 ENCOUNTER — Other Ambulatory Visit: Payer: Self-pay | Admitting: Obstetrics and Gynecology

## 2015-05-05 ENCOUNTER — Other Ambulatory Visit: Payer: Self-pay | Admitting: *Deleted

## 2015-07-01 ENCOUNTER — Ambulatory Visit (INDEPENDENT_AMBULATORY_CARE_PROVIDER_SITE_OTHER): Payer: Medicare Other | Admitting: Family Medicine

## 2015-07-01 ENCOUNTER — Encounter: Payer: Self-pay | Admitting: Family Medicine

## 2015-07-01 VITALS — BP 133/69 | HR 70 | Temp 97.5°F | Ht 64.0 in | Wt 212.0 lb

## 2015-07-01 DIAGNOSIS — L080 Pyoderma: Secondary | ICD-10-CM

## 2015-07-01 DIAGNOSIS — B309 Viral conjunctivitis, unspecified: Secondary | ICD-10-CM | POA: Diagnosis present

## 2015-07-01 DIAGNOSIS — R21 Rash and other nonspecific skin eruption: Secondary | ICD-10-CM

## 2015-07-01 MED ORDER — VALACYCLOVIR HCL 1 G PO TABS: 1000.0000 mg | ORAL_TABLET | Freq: Three times a day (TID) | ORAL | Status: DC

## 2015-07-01 MED ORDER — VALACYCLOVIR HCL 1 G PO TABS
1000.0000 mg | ORAL_TABLET | Freq: Three times a day (TID) | ORAL | Status: DC
Start: 2015-07-01 — End: 2015-07-01

## 2015-07-01 NOTE — Patient Instructions (Signed)
Thank you for coming to see me today. It was a pleasure. Today we talked about:   Rash: this rash may be shingles, but it may also be secondary to a contact to something your body had an allergic reaction to. We can treat for a presumed shingles infection to be on the safe side. This infection is spread by direct contact. You can use topical hydrocortisone over the counter to help with itching, washing hands after application. If you notice this rash near your eye, or the tip of your nose, please return immediately.   If you have any questions or concerns, please do not hesitate to call the office at [phone].  Sincerely,  Jacquelin Hawking, MD

## 2015-07-01 NOTE — Progress Notes (Signed)
    Subjective   Alyssa Powell is a 64 y.o. female that presents for a same day visit  1. Rash: Symptoms first noticed last week. Rash described as bumpy, red and itchy. Rash started underneath left side of breast and moved across front of chest. Rash is itchy and sometimes burns but does not hurt. No fevers, nausea or vomiting. No sick contacts. No new clothing or detergents. She has tried Campho phenique which helped. She has a history of chicken pox in the past. No history of shingles.  ROS Per HPI  Social History  Substance Use Topics  . Smoking status: Never Smoker   . Smokeless tobacco: None  . Alcohol Use: No    Allergies  Allergen Reactions  . Amoxicillin     REACTION: unspecified  . Codeine Phosphate     REACTION: unspecified    Objective   BP 133/69 mmHg  Pulse 70  Temp(Src) 97.5 F (36.4 C) (Oral)  Ht  (1.626 m)  Wt 212 lb (96.163 kg)  BMI 36.37 kg/m2  General: Well appearing Skin: papular, erythematous rash on right upper chest that travels in a line diagonally with another area or papular rash on right breast. Old rash present on left side of chest Neuro: Alert, oriented  Assessment and Plan   Meds ordered this encounter  Medications  . DISCONTD: valACYclovir (VALTREX) 1000 MG tablet    Sig: Take 1 tablet (1,000 mg total) by mouth 3 (three) times daily.    Dispense:  30 tablet    Refill:  0  . valACYclovir (VALTREX) 1000 MG tablet    Sig: Take 1 tablet (1,000 mg total) by mouth 3 (three) times daily.    Dispense:  21 tablet    Refill:  0    Rash: possibly shingles but more probably contact dermatitis. Since patient goes to a group home, will treat with antivirals  Valacyclovir 1g TID x10 days  Hydrocortisone cream OTC PRN for itching  Return precautions discussed

## 2015-07-08 ENCOUNTER — Ambulatory Visit (INDEPENDENT_AMBULATORY_CARE_PROVIDER_SITE_OTHER): Payer: Medicare Other | Admitting: Family Medicine

## 2015-07-08 ENCOUNTER — Encounter: Payer: Self-pay | Admitting: Family Medicine

## 2015-07-08 VITALS — BP 151/80 | HR 69 | Temp 97.8°F | Ht 63.0 in | Wt 209.2 lb

## 2015-07-08 DIAGNOSIS — B309 Viral conjunctivitis, unspecified: Secondary | ICD-10-CM | POA: Diagnosis present

## 2015-07-08 DIAGNOSIS — R21 Rash and other nonspecific skin eruption: Secondary | ICD-10-CM

## 2015-07-08 DIAGNOSIS — Z23 Encounter for immunization: Secondary | ICD-10-CM

## 2015-07-08 NOTE — Progress Notes (Signed)
Patient ID: Peterson AoWanda Kral, female   DOB: 04/12/1951, 64 y.o.   MRN: [id number]   Subjective: CC:  Follow-up for rash HPI: This history was provided by the patient and her mother.  Patient is a 64 y.o. female presenting to clinic today for follow-up regarding a rash she had on her chest last week.  She was evaluated in clinic for the rash on 10/11.  Patient has completed a 7 day course of Valtrex orally and has been applying hydrocortisone cream to area.  Patient states the rash is "drying up," and is no longer itching.  Patient's sister stated patient had worn a necklace a couple of days prior to the development of the rash and the rash pattern is consistent with the necklace pattern.  Patient denies rash in other areas, abdominal pain, chest pain, N/V/D, shortness of breath, dizziness and fever.  Patient lives with her mother, but attends and adult day program.  She would like a note from her healthcare providers stating the rash has resolved and she may return to her program.    Concerns today include:  1. Patient would like the rash on her upper chest re-assessed and if it is resolving, she would like a note to return to her day program   Social History Reviewed: Non smoker. FamHx and MedHx updated.  Please see EMR. Health Maintenance: Influenza vaccination  ROS: Per HPI  Objective: Office vital signs reviewed. BP 151/80 mmHg  Pulse 69  Temp(Src) 97.8 F (36.6 C) (Oral)  Ht 5\' 3"  (1.6 m)  Wt 209 lb 4 oz (94.915 kg)  BMI 37.08 kg/m2  Physical Examination:  General: Awake, alert, well nourished, NAD Cardio: RRR, S1S2 heard, no murmurs appreciated, No edema, cyanosis or clubbing; +2 pulses bilaterally Pulm: CTAB, no wheezes, rhonchi or rales GI: Soft, NT/ND, bowel sounds present GU: deffered Extremities: MAE MSK: Normal gait and station, no arthralgias Skin: Dry, intact.  Rash to upper chest is resolving with dry papules and no drainage noted.  No rashes or lesions noted on arms,  legs, abdomen or back. Neuro: Strength and sensation grossly intact, EOMI    Assessment/ Plan: 64 y.o. female who presents with papular rash to upper chest.  Most likely contact dermatitis.  Her sister stated patient had worn a necklace a couple of days prior to the development of the rash and the rash pattern is consistent with the necklace pattern.  Patient discouraged from wearing the necklace again.  Rash is dry with no drainage.  Patient is safe to return to her adult day program and received a note stating so.  1. Rash and nonspecific skin eruption - Resolving rash.  Patient has completed a 7 day course of Valtrex as previously prescribed.  Patient encouraged to continue use of OTC hydrocortisone cream as needed for itching.  2. Encounter for immunization - Influenza vaccination given    Nelly RoutLaura Ellason Segar, NP [facility]

## 2015-07-08 NOTE — Patient Instructions (Signed)
Rash  It was nice meeting you today!  Your rash seems to be resolving.  You can continue using the topical steroid cream for itching as you need to.    A rash is a change in the color or texture of the skin. There are many different types of rashes. You may have other problems that accompany your rash. CAUSES   Infections.  Allergic reactions. This can include allergies to pets or foods.  Certain medicines.  Exposure to certain chemicals, soaps, or cosmetics.  Heat.  Exposure to poisonous plants.  Tumors, both cancerous and noncancerous. SYMPTOMS   Redness.  Scaly skin.  Itchy skin.  Dry or cracked skin.  Bumps.  Blisters.  Pain. DIAGNOSIS  Your caregiver may do a physical exam to determine what type of rash you have. A skin sample (biopsy) may be taken and examined under a microscope. TREATMENT  Treatment depends on the type of rash you have. Your caregiver may prescribe certain medicines. For serious conditions, you may need to see a skin doctor (dermatologist). HOME CARE INSTRUCTIONS   Avoid the substance that caused your rash.  Do not scratch your rash. This can cause infection.  You may take cool baths to help stop itching.  Only take over-the-counter or prescription medicines as directed by your caregiver.  Keep all follow-up appointments as directed by your caregiver. SEEK IMMEDIATE MEDICAL CARE IF:  You have increasing pain, swelling, or redness.  You have a fever.  You have new or severe symptoms.  You have body aches, diarrhea, or vomiting.  Your rash is not better after 3 days. MAKE SURE YOU:  Understand these instructions.  Will watch your condition.  Will get help right away if you are not doing well or get worse.   This information is not intended to replace advice given to you by your health care provider. Make sure you discuss any questions you have with your health care provider.   Document Released: 08/27/2002 Document Revised:  09/27/2014 Document Reviewed: 01/22/2015 Elsevier Interactive Patient Education [employer]2016 Elsevier Inc.

## 2015-07-19 ENCOUNTER — Other Ambulatory Visit: Payer: Self-pay | Admitting: Obstetrics and Gynecology

## 2015-07-29 ENCOUNTER — Telehealth: Payer: Self-pay | Admitting: Obstetrics and Gynecology

## 2015-07-29 NOTE — Telephone Encounter (Signed)
LM for patient and mother to call back and schedule an appt. Madoc Holquin,CMA

## 2015-07-29 NOTE — Telephone Encounter (Signed)
Received form from patient's adult day center stating that she needs documentation from her annual medical examination. Patient has not had her annual medical examination yet this year. Please have patient schedule appointment at her earliest convenience so that she is not suspended from the program. Thanks!

## 2015-08-11 ENCOUNTER — Ambulatory Visit (INDEPENDENT_AMBULATORY_CARE_PROVIDER_SITE_OTHER): Payer: Medicare Other | Admitting: Student

## 2015-08-11 ENCOUNTER — Encounter: Payer: Self-pay | Admitting: Student

## 2015-08-11 VITALS — BP 151/80 | HR 68 | Temp 97.7°F | Ht 63.0 in | Wt 209.4 lb

## 2015-08-11 DIAGNOSIS — I10 Essential (primary) hypertension: Secondary | ICD-10-CM | POA: Diagnosis not present

## 2015-08-11 DIAGNOSIS — B309 Viral conjunctivitis, unspecified: Secondary | ICD-10-CM | POA: Diagnosis present

## 2015-08-11 DIAGNOSIS — F79 Unspecified intellectual disabilities: Secondary | ICD-10-CM | POA: Diagnosis not present

## 2015-08-11 MED ORDER — AMLODIPINE BESYLATE 5 MG PO TABS: 5.0000 mg | ORAL_TABLET | Freq: Every day | ORAL | Status: DC

## 2015-08-11 MED ORDER — HYDROCHLOROTHIAZIDE 25 MG PO TABS: 25.0000 mg | ORAL_TABLET | Freq: Every day | ORAL | Status: DC

## 2015-08-11 NOTE — Assessment & Plan Note (Signed)
BP stable at 151/80 today however Alyssa Powell's care giver desires to change BP regimen. Currently on lisinopril-HCTZ 20-25 - Will continue HCTZ 25 and add Norvasc 5 mg - BP check in 1 week

## 2015-08-11 NOTE — Progress Notes (Signed)
   Subjective:    Patient ID: Alyssa Powell, female    DOB: Jun 24, 1951, 64 y.o.   MRN: [id number]   CC: physical exam, BP med change  HPI 64 y/o with PMH sig for HTN and mental retardation  HTN - Mother is her care taker and desires that she stop taking her lisinipril as another person she knows has severe tongue swelling associated with it and had to be intubated - she would like wands to be soley on HCTZ, she is currently on combined lisinopril-HCTZ - Alyssa Powell has never had swelling nor any other reaction to lisinopril - denies cough, chest pain, SOB, headache  MR - Is seen at an adult day care and requires a physical examination form   Review of Systems   See HPI for ROS.   Past Medical History  Diagnosis Date  . Allergy   . Asthma   . Hypertension   . Mental retardation    No past surgical history on file. OB History    No data available     Social History   Social History  . Marital Status: Married    Spouse Name: N/A  . Number of Children: N/A  . Years of Education: N/A   Occupational History  . Not on file.   Social History Main Topics  . Smoking status: Never Smoker   . Smokeless tobacco: Not on file  . Alcohol Use: No  . Drug Use: No  . Sexual Activity: No   Other Topics Concern  . Not on file   Social History Narrative    Objective:  BP 151/80 mmHg  Pulse 68  Temp(Src) 97.7 F (36.5 C) (Oral)  Ht 5\' 3"  (1.6 m)  Wt 209 lb 6.4 oz (94.983 kg)  BMI 37.10 kg/m2 Vitals and nursing note reviewed  General: NAD Cardiac: RRR, n Respiratory: CTAB, normal effort Abdomen: obese, soft, nontender,  Extremities: no edema or cyanosis. . Skin: warm and dry, no rashes noted Neuro: alert and oriented, no focal deficits   Assessment & Plan:    HYPERTENSION, BENIGN SYSTEMIC BP stable at 151/80 today however Alyssa Powell's care giver desires to change BP regimen. Currently on lisinopril-HCTZ 20-25 - Will continue HCTZ 25 and add Norvasc 5 mg - BP check in 1  week  MENTAL RETARDATION Adult day care form completed     Dickey Caamano A. Kennon RoundsHaney MD, MS Family Medicine Resident PGY-2 Pager [phone]

## 2015-08-11 NOTE — Patient Instructions (Signed)
Follow up in 1 week for BP check You were started on Novasc 5 mg in addition to hydrochlorothiazide 25 mg If you have headache, chest pain, Shortness of breath please call the office at [phone]

## 2015-08-11 NOTE — Assessment & Plan Note (Signed)
Adult day care form completed

## 2015-08-19 ENCOUNTER — Telehealth: Payer: Self-pay | Admitting: Obstetrics and Gynecology

## 2015-08-19 NOTE — Telephone Encounter (Signed)
Pt bp was taken at her day center and it was 130/70 which was really good according to the nurse

## 2015-08-19 NOTE — Telephone Encounter (Signed)
Will forward to PCP to make her aware of this. Brynnley Dayrit, CMA.

## 2015-08-23 ENCOUNTER — Other Ambulatory Visit: Payer: Self-pay | Admitting: Obstetrics and Gynecology

## 2015-11-04 ENCOUNTER — Other Ambulatory Visit: Payer: Self-pay | Admitting: Obstetrics and Gynecology

## 2015-11-19 ENCOUNTER — Telehealth: Payer: Self-pay | Admitting: Obstetrics and Gynecology

## 2015-11-19 DIAGNOSIS — F79 Unspecified intellectual disabilities: Secondary | ICD-10-CM

## 2015-11-19 NOTE — Telephone Encounter (Signed)
Needs new referral to [facility] for personal care

## 2015-11-19 NOTE — Telephone Encounter (Signed)
Will forward to MD. Jazmin Hartsell,CMA  

## 2015-11-21 ENCOUNTER — Telehealth: Payer: Self-pay | Admitting: *Deleted

## 2015-11-21 ENCOUNTER — Encounter: Payer: Self-pay | Admitting: Family Medicine

## 2015-11-21 ENCOUNTER — Ambulatory Visit (INDEPENDENT_AMBULATORY_CARE_PROVIDER_SITE_OTHER): Payer: Medicare Other | Admitting: Family Medicine

## 2015-11-21 VITALS — BP 122/86 | HR 64 | Temp 97.4°F | Ht 63.0 in | Wt 210.0 lb

## 2015-11-21 DIAGNOSIS — J309 Allergic rhinitis, unspecified: Secondary | ICD-10-CM

## 2015-11-21 DIAGNOSIS — J4540 Moderate persistent asthma, uncomplicated: Secondary | ICD-10-CM

## 2015-11-21 DIAGNOSIS — I10 Essential (primary) hypertension: Secondary | ICD-10-CM

## 2015-11-21 DIAGNOSIS — J069 Acute upper respiratory infection, unspecified: Secondary | ICD-10-CM | POA: Diagnosis not present

## 2015-11-21 DIAGNOSIS — B309 Viral conjunctivitis, unspecified: Secondary | ICD-10-CM | POA: Diagnosis present

## 2015-11-21 MED ORDER — PREDNISONE 20 MG PO TABS: 40.0000 mg | ORAL_TABLET | Freq: Every day | ORAL | Status: AC

## 2015-11-21 MED ORDER — PREDNISONE 20 MG PO TABS: 40.0000 mg | ORAL_TABLET | Freq: Every day | ORAL | Status: DC

## 2015-11-21 MED ORDER — DEXTROMETHORPHAN-GUAIFENESIN 10-100 MG/5ML PO LIQD: 10.0000 mL | ORAL | Status: DC | PRN

## 2015-11-21 NOTE — Telephone Encounter (Signed)
Completed.

## 2015-11-21 NOTE — Patient Instructions (Signed)
You were seen for an upper respiratory infection. Right now you do not have any wheezing or signs of an asthma exacerbation.   If your breathing worsens over the weekend, please start the prednisone and make an appointment to be seen Monday or Tuesday (3/6 or 3/7).   If you have wheezing and worsening shortness of breath take your albuterol inhaler like this:   4 puffs every 6 hours for 2 days (do not miss doses)    Go to the ER if you cannot breath or your breathing does not improved with the albuterol or the steroid.   Upper Respiratory Infection, Adult Most upper respiratory infections (URIs) are a viral infection of the air passages leading to the lungs. A URI affects the nose, throat, and upper air passages. The most common type of URI is nasopharyngitis and is typically referred to as "the common cold." URIs run their course and usually go away on their own. Most of the time, a URI does not require medical attention, but sometimes a bacterial infection in the upper airways can follow a viral infection. This is called a secondary infection. Sinus and middle ear infections are common types of secondary upper respiratory infections. Bacterial pneumonia can also complicate a URI. A URI can worsen asthma and chronic obstructive pulmonary disease (COPD). Sometimes, these complications can require emergency medical care and may be life threatening.  CAUSES Almost all URIs are caused by viruses. A virus is a type of germ and can spread from one person to another.  RISKS FACTORS You may be at risk for a URI if:   You smoke.   You have chronic heart or lung disease.  You have a weakened defense (immune) system.   You are very young or very old.   You have nasal allergies or asthma.  You work in crowded or poorly ventilated areas.  You work in health care facilities or schools. SIGNS AND SYMPTOMS  Symptoms typically develop 2-3 days after you come in contact with a cold virus. Most  viral URIs last 7-10 days. However, viral URIs from the influenza virus (flu virus) can last 14-18 days and are typically more severe. Symptoms may include:   Runny or stuffy (congested) nose.   Sneezing.   Cough.   Sore throat.   Headache.   Fatigue.   Fever.   Loss of appetite.   Pain in your forehead, behind your eyes, and over your cheekbones (sinus pain).  Muscle aches.  DIAGNOSIS  Your health care provider may diagnose a URI by:  Physical exam.  Tests to check that your symptoms are not due to another condition such as:  Strep throat.  Sinusitis.  Pneumonia.  Asthma. TREATMENT  A URI goes away on its own with time. It cannot be cured with medicines, but medicines may be prescribed or recommended to relieve symptoms. Medicines may help:  Reduce your fever.  Reduce your cough.  Relieve nasal congestion. HOME CARE INSTRUCTIONS   Take medicines only as directed by your health care provider.   Gargle warm saltwater or take cough drops to comfort your throat as directed by your health care provider.  Use a warm mist humidifier or inhale steam from a shower to increase air moisture. This may make it easier to breathe.  Drink enough fluid to keep your urine clear or pale yellow.   Eat soups and other clear broths and maintain good nutrition.   Rest as needed.   Return to work when your  temperature has returned to normal or as your health care provider advises. You may need to stay home longer to avoid infecting others. You can also use a face mask and careful hand washing to prevent spread of the virus.  Increase the usage of your inhaler if you have asthma.   Do not use any tobacco products, including cigarettes, chewing tobacco, or electronic cigarettes. If you need help quitting, ask your health care provider. PREVENTION  The best way to protect yourself from getting a cold is to practice good hygiene.   Avoid oral or hand contact with  people with cold symptoms.   Wash your hands often if contact occurs.  There is no clear evidence that vitamin C, vitamin E, echinacea, or exercise reduces the chance of developing a cold. However, it is always recommended to get plenty of rest, exercise, and practice good nutrition.  SEEK MEDICAL CARE IF:   You are getting worse rather than better.   Your symptoms are not controlled by medicine.   You have chills.  You have worsening shortness of breath.  You have brown or red mucus.  You have yellow or brown nasal discharge.  You have pain in your face, especially when you bend forward.  You have a fever.  You have swollen neck glands.  You have pain while swallowing.  You have white areas in the back of your throat. SEEK IMMEDIATE MEDICAL CARE IF:   You have severe or persistent:  Headache.  Ear pain.  Sinus pain.  Chest pain.  You have chronic lung disease and any of the following:  Wheezing.  Prolonged cough.  Coughing up blood.  A change in your usual mucus.  You have a stiff neck.  You have changes in your:  Vision.  Hearing.  Thinking.  Mood. MAKE SURE YOU:   Understand these instructions.  Will watch your condition.  Will get help right away if you are not doing well or get worse.   This information is not intended to replace advice given to you by your health care provider. Make sure you discuss any questions you have with your health care provider.   Document Released: 03/02/2001 Document Revised: 01/21/2015 Document Reviewed: 12/12/2013 Elsevier Interactive Patient Education [employer]

## 2015-11-21 NOTE — Progress Notes (Signed)
Patient ID: Alyssa AoWanda Beane, female   DOB: June 03, 1951, 65 y.o.   MRN: [id number]   Subjective:   Alyssa Powell is a 65 y.o. female with a history of HTN and asthma presenting with  Here for  Chief Complaint  Patient presents with  . cough and SOB    x 1day   SOB/Cough:  Reports sx start yesterday, trouble sleeping last night due to cough. Denies fever, chills, nausea/vomiting. Report SOB esp at night and when lying down. No alleviating factors. No using albuterol regularly. Reports nasal congestion, no rhinorrhea.   Asthma: takign advair, well controlled per pt  HTN: no cp, +SOB per above. Compliant with meds  Health Maintenance Due  Topic Date Due  . Hepatitis C Screening  June 03, 1951  . MAMMOGRAM  08/01/1999  . COLONOSCOPY  08/01/1999  . ZOSTAVAX  07/31/2009  . DEXA SCAN  07/31/2014  . PNA vac Low Risk Adult (1 of 2 - PCV13) 07/31/2014    Review of Systems:   Per HPI. All other systems reviewed and are negative expect as in HPI   PMH, PSH, Medications, Allergies, SocialHx and FHx reviewed and updated in EMR- marked as reviewed 11/21/2015   Objective:  BP 122/86 mmHg  Pulse 64  Temp(Src) 97.4 F (36.3 C) (Oral)  Ht 5\' 3"  (1.6 m)  Wt 210 lb (95.255 kg)  BMI 37.21 kg/m2  SpO2 96%  Gen:  65 y.o. female in NAD. Speaking and answering questions well, not breathless HEENT: NCAT, MMM, EOMI, PERRL, anicteric sclerae.  CV: RRR, no MRG, no JVD Resp: Normal WOB. Non-labored, CTAB, no wheezes noted. Good air movement throughout all lung fields GI: Soft, NTND, BS present, no guarding or organomegaly Ext: WWP, no edema MSK: Intact gait. Full ROM Neuro: Alert and oriented, speech normal Pysch: Normal mood and affect.       Chemistry      Component Value Date/Time   NA 138 10/19/2013 1145   K 4.2 10/19/2013 1145   CL 100 10/19/2013 1145   CO2 31 10/19/2013 1145   BUN 17 10/19/2013 1145   CREATININE 1.02 10/19/2013 1145   CREATININE 1.16 08/07/2009 2017      Component Value  Date/Time   CALCIUM 9.0 10/19/2013 1145   ALKPHOS 61 10/19/2013 1145   AST 14 10/19/2013 1145   ALT 13 10/19/2013 1145   BILITOT 0.4 10/19/2013 1145      No results found for: HGBA1C Assessment:     Alyssa AoWanda Mcfarlan is a 65 y.o. female here for most likely URI in the setting of asthma.     Plan:   1. Asthma, chronic, moderate persistent, uncomplicated No s/sx of exacerbation currently but is at risk for worsening given natural hx of uri.  Instructed on increased albuterol if SOB worsens and givne paper Rx for prednisone burst.  - predniSONE (DELTASONE) 20 MG tablet; Take 2 tablets (40 mg total) by mouth daily. 3 tabs po day one, then 2 tabs daily x 4 days  Dispense: 10 tablet; Refill: 0 -RTC if she needs increased albuterol or prednisone.   2. Essential hypertension, benign Stable, well controlled. no changes to medications  3. Allergic rhinitis, unspecified allergic rhinitis type On antihistamine  4. Acute upper respiratory infection - Discussed sx relief -dextromethorphan-guaiFENesin (TUSSIN DM) 10-100 MG/5ML liquid; Take 10 mLs by mouth every 4 (four) hours as needed for cough.  Dispense: 237 mL; Refill: 1   Federico FlakeKimberly Niles Newton, MD, ABFM OB Fellow, Faculty [facility] 11/21/2015  2:11 PM

## 2015-11-21 NOTE — Telephone Encounter (Signed)
Pt mom called and wanted to see if we could give her a shot of steroid because she cant pick up the meds today.    Spoke with Alvester MorinNewton, she is not to fill the prednisone at this time.  She is to only fill it if she gets worse this weekend, given her hx. (this was printed in her AVS)  LMOVM for mom to call back. Verline Kong, Maryjo RochesterJessica Dawn

## 2015-11-30 ENCOUNTER — Other Ambulatory Visit: Payer: Self-pay | Admitting: Obstetrics and Gynecology

## 2015-12-01 NOTE — Telephone Encounter (Signed)
Mother calling to ask about soemthing else for Alyssa Powell's cough.

## 2015-12-02 NOTE — Telephone Encounter (Signed)
Patient will need to be re-evaluated for cough still persisting since last appointment. I did not evaluate patient at that time so not comfortable prescribing anything different.

## 2015-12-02 NOTE — Telephone Encounter (Signed)
Patient scheduled with pcp 12-03-15 at 930am. Ernest Orr,CMA

## 2015-12-03 ENCOUNTER — Encounter: Payer: Self-pay | Admitting: Family Medicine

## 2015-12-03 ENCOUNTER — Ambulatory Visit: Payer: Medicare Other | Admitting: Obstetrics and Gynecology

## 2015-12-03 ENCOUNTER — Ambulatory Visit (INDEPENDENT_AMBULATORY_CARE_PROVIDER_SITE_OTHER): Payer: Medicare Other | Admitting: Family Medicine

## 2015-12-03 VITALS — BP 137/77 | HR 66 | Temp 97.9°F | Wt 207.1 lb

## 2015-12-03 DIAGNOSIS — R05 Cough: Secondary | ICD-10-CM | POA: Insufficient documentation

## 2015-12-03 DIAGNOSIS — R059 Cough, unspecified: Secondary | ICD-10-CM | POA: Insufficient documentation

## 2015-12-03 DIAGNOSIS — F70 Mild intellectual disabilities: Secondary | ICD-10-CM | POA: Diagnosis not present

## 2015-12-03 DIAGNOSIS — B309 Viral conjunctivitis, unspecified: Secondary | ICD-10-CM | POA: Diagnosis present

## 2015-12-03 NOTE — Assessment & Plan Note (Signed)
Most likely this related to her upper respiratory infection. There is no suggestion of pneumonia, reflux or heart failure. Does not seem to be related to her asthma as having good air movement throughout with no wheezing - Advised that they can try honey or throat lozenges - Encourage them to stop using albuterol every 6 hours and go back to using as needed basis. - They can continue to use the Robitussin-DM. - Advised a follow-up of her cough worsens or last longer than 3-4 weeks. - If cough is lingering then may need to consider atypical pneumonia versus reflux versus asthma. May need a chest x-ray if persistent.

## 2015-12-03 NOTE — Progress Notes (Signed)
   Subjective:    Patient ID: Alyssa Powell, female    DOB: 1950/12/16, 65 y.o.   MRN: [id number]  Seen for Same day visit for   CC: COUGH  Has been coughing for 12 days. She was given prednisone on 3/3 for an asthma exacerbation  Cough is: deep and wakes her up. Occurs day and night  Sputum production: intermittent  Medications tried: robitussin DM. No improvement  Taking albuterol every 6 hours for the past two weeks.  She has been hospitalized twice for PNA  Feels like the cough is improving    Symptoms Runny nose: no Mucous in back of throat: no Throat burning or reflux: no Wheezing or asthma: some  Fever: no Chest Pain: no Shortness of breath: no Leg swelling: no Hemoptysis: no Weight loss: no  PMH: mental retardation, obesity, asthma  SH: no tobacco or EtOH   Review of Systems   See HPI for ROS. Objective:  BP 137/77 mmHg  Pulse 66  Temp(Src) 97.9 F (36.6 C) (Oral)  Wt 207 lb 1.6 oz (93.94 kg)  General: NAD HEENT: Tympanic membranes clear and intact bilaterally, clear conjunctiva, no cervical lymphadenopathy, moist mucous membranes, no no tonsillar exudates, oropharynx clear, uvula midline Cardiac: RRR, normal heart sounds, no murmurs. Respiratory: CTAB, normal effort, no wheezing or crackles Extremities: no edema, WWP. Skin: warm and dry, no rashes noted     Assessment & Plan:   Cough Most likely this related to her upper respiratory infection. There is no suggestion of pneumonia, reflux or heart failure. Does not seem to be related to her asthma as having good air movement throughout with no wheezing - Advised that they can try honey or throat lozenges - Encourage them to stop using albuterol every 6 hours and go back to using as needed basis. - They can continue to use the Robitussin-DM. - Advised a follow-up of her cough worsens or last longer than 3-4 weeks. - If cough is lingering then may need to consider atypical pneumonia versus reflux versus  asthma. May need a chest x-ray if persistent.

## 2015-12-03 NOTE — Patient Instructions (Signed)
Thank you for coming in,   You can try using honey for the cough.   Most likely it is related to her respiratory infection.   She can stop using the albuterol every 6 hours and only use it as needed.   If she doesn't get get in two weeks then please follow up with her regular doctor.   Please bring all of your medications with you to each visit.   Sign up for My Chart to have easy access to your labs results, and communication with your Primary care physician   Please feel free to call with any questions or concerns at any time, at [phone]. --Dr. Jordan LikesSchmitz

## 2015-12-08 ENCOUNTER — Telehealth: Payer: Self-pay | Admitting: Obstetrics and Gynecology

## 2015-12-08 NOTE — Telephone Encounter (Signed)
Mother called because Alyssa Powell has rejected the forms from 11/21/15 for her daughter to have personal care services. [facility] stated that the forms were incomplete and need additional information. Mother is asking what the status is and if the form has been updated and faxed back in. jw

## 2015-12-09 NOTE — Telephone Encounter (Signed)
Will forward to MD. Alyssa Powell,CMA  

## 2015-12-11 NOTE — Telephone Encounter (Signed)
I will need to look into this further. I will be in clinic on 3/24. May need them to resend forms and tell me what exactly they need. The only thing I saw while filling the form that could be a problem is patient has not seen me her PCP in over a year. Has seen other providers. Patient may need to see me prior to receiving services.

## 2016-02-05 ENCOUNTER — Telehealth: Payer: Self-pay | Admitting: Obstetrics and Gynecology

## 2016-02-05 NOTE — Telephone Encounter (Signed)
Inquiring about the forms sent in March to [facility] regarding receiving personal care services for patient.  Please contact mother about this.

## 2016-02-05 NOTE — Telephone Encounter (Signed)
I called pts mother to discuss- pts mother states she dropped off the forms for [facility] last week, I did not see anything in our folder. I told mom you had completed one set of forms and sent them in, in March, but the request was denied. I told pts mother that you needed to know what exactly needed to be said on forms in order for [facility] to approve. I stated to pts mother that the patient has not been seen by you for a physical and should consider coming in for an apt and you all could fill forms out together. Pts mother stated she is disabled and unable to bring her and her daughters care giver just had her hip replaced, so coming in is not an option. Please advise.

## 2016-02-08 NOTE — Telephone Encounter (Signed)
Filled out another form about 3 weeks ago. I also have tried calling [employer] both the local and the corporate number and never get anyone on the phone. Please have patient's mother call again to see if it was approved. If not please ask her to provide me with a direct number that I can get in contact with someone directly to figure out what is wrong about my form. Patient does need to be seen soon however since it has been so long and this is not good for patient care as I do not know what is going on health wise. Thank you.

## 2016-02-09 NOTE — Telephone Encounter (Signed)
I spoke with patients mother and gave her directions to call [employer] to see if form was approved, and if the form was not approved, then to ask for specific number where we can reach someone at to discuss. Pts mother is to call me back in the am. Page, cma,

## 2016-02-10 ENCOUNTER — Other Ambulatory Visit: Payer: Self-pay | Admitting: Obstetrics and Gynecology

## 2016-02-12 NOTE — Telephone Encounter (Signed)
Mother is calling back with a phone number that we can use to see what the problem is and if we have the correct information. [name]. jw

## 2016-02-12 NOTE — Telephone Encounter (Signed)
The [facility] she [facility] me [facility]. I was then transferred to several different numbers and unable to find where I needed to be. Again a direct number to someone would be nice because I spent 30 minutes going around on the phone and still could not find what I need. What number does patient's mom use to contact the [facility] directly? For instance if she had a problem or wanted to cancel services she should have a direct number. I am sorry I could not be of more help.

## 2016-02-12 NOTE — Telephone Encounter (Signed)
Dr. Doroteo GlassmanPhelps try this number [phone]

## 2016-02-19 NOTE — Telephone Encounter (Signed)
Mother calls back with [facility] for [facility] [phone]. Please call mother once forms have been faxed.

## 2016-02-19 NOTE — Telephone Encounter (Signed)
Called again. Still not the right number. It was to a behavorial health service who does not do any home health services or aide services. They states they are not associated with [employer]. At this time I would ask that you have mother call these numbers herself and inquire about the services.

## 2016-02-19 NOTE — Telephone Encounter (Signed)
Maybe this one is the lucky number.Marland Kitchen..Marland Kitchen

## 2016-02-20 NOTE — Telephone Encounter (Signed)
Spoke with patients and mother and she understands and appreciates all of your efforts. I gave her instructions to call herself and see what exactly it is they need for paperwork and to let us know and we will be happy to fill them out for her. Just an FYI. Page, cma.

## 2016-03-04 NOTE — Telephone Encounter (Signed)
Mother called again about PCA services for her daughter. She said she had talked to [facility] and [facility] was suppose to be faxing the forms her. No documentation of forms. She was callling [facility] back to get the forms refaxed

## 2016-03-04 NOTE — Telephone Encounter (Signed)
Will forward to MD to see if forms were received. Alyssa Powell,CMA

## 2016-03-04 NOTE — Telephone Encounter (Signed)
Have not seen any forms in my box for [facility] from [facility]. If she can provide me with the number she called to talk to them with this time I can try and call them myself.

## 2016-03-08 NOTE — Telephone Encounter (Signed)
Will forward to MD. Merik Mignano,CMA  

## 2016-03-08 NOTE — Telephone Encounter (Signed)
Customer Service [phone].  Is the number for [employer] and they will direct us to who we need to talk with.  Alyssa HawthorneJazmin Marta Powell,CMA

## 2016-03-08 NOTE — Telephone Encounter (Signed)
Spoke with mother and she is going to call us back with the name and number of who she has been talking with at [facility] regarding patient's care.  Christal Lagerstrom,CMA

## 2016-03-12 NOTE — Telephone Encounter (Signed)
Spoke with mother and scheduled for 03-29-16 at 1030.  She would like to know if there is anyway we could squeeze patient in sooner. Lambert Jeanty,CMA

## 2016-03-12 NOTE — Telephone Encounter (Signed)
This was the correct number for [employer]. I spoke with a representative named [place]. She stated that the last form was denied because 1. there was no date of last visit and 2. the medicaid ID# did not match what they had on file. I have received a new blank form to fill out. However, Burna MortimerWanda needs to come in for a visit. She has to have been evaluated within the last 90 days and she has not. Please call and inform her mother and help set up an appointment. Thank you! Glad we can finally get this straightened out.

## 2016-03-14 NOTE — Telephone Encounter (Signed)
Unfortunately I do not think I will be able to squeeze her in sooner. Patient panel size increasing in July and I need to see how I will adjust to this before double booking.

## 2016-03-15 ENCOUNTER — Telehealth: Payer: Self-pay | Admitting: *Deleted

## 2016-03-15 NOTE — Telephone Encounter (Signed)
Error

## 2016-03-15 NOTE — Telephone Encounter (Signed)
Tried calling mother regarding appt but line was disconnected.  If she calls back please inform her that provider is unable to get patient in any sooner than 03-29-16 and she needs to keep this appt date.  Thanks Limited BrandsJazmin Omer Monter,CMA

## 2016-03-15 NOTE — Telephone Encounter (Signed)
Will forward to MD to see if there is someone in particular she can see for this.  Arabella Revelle,CMA

## 2016-03-15 NOTE — Telephone Encounter (Signed)
Patient mother informed of message from MD, wants to know if she can see another provider instead because she feels that she cannot wait until 7/10.

## 2016-03-16 NOTE — Telephone Encounter (Signed)
No particular provider needed. She just needs to be evaluated for her home health services and need for aid. Whomever has availability I would try and schedule her with. The provider that sees her will need to fill out the form for [facility] which is in my box.

## 2016-03-17 NOTE — Telephone Encounter (Signed)
Mother scheduled with Dr. Pollie MeyerMcintyre tomorrow.  Message sent to Dr. Pollie MeyerMcIntyre so she is aware of what patient is needing. Jazmin Hartsell,CMA

## 2016-03-18 ENCOUNTER — Ambulatory Visit (INDEPENDENT_AMBULATORY_CARE_PROVIDER_SITE_OTHER): Payer: Medicare Other | Admitting: Family Medicine

## 2016-03-18 ENCOUNTER — Encounter: Payer: Self-pay | Admitting: Family Medicine

## 2016-03-18 VITALS — BP 125/79 | HR 68 | Temp 98.1°F | Wt 216.0 lb

## 2016-03-18 DIAGNOSIS — Z0289 Encounter for other administrative examinations: Secondary | ICD-10-CM | POA: Diagnosis not present

## 2016-03-18 DIAGNOSIS — F79 Unspecified intellectual disabilities: Secondary | ICD-10-CM

## 2016-03-18 DIAGNOSIS — B309 Viral conjunctivitis, unspecified: Secondary | ICD-10-CM | POA: Diagnosis present

## 2016-03-18 NOTE — Progress Notes (Signed)
Date of Visit: 03/18/2016   HPI:  Patient presents to have forms completed for Personal Care Services. She is accompanied by her sister. Lives with her mother who is 67 years old. Goes to adult day program.  They have no concerns today. She's doing well. Does have history of mental retardation. [facility] of [facility]oloft & trazodone.   ROS: See HPI.  PMFSH: history of obesity, mental retardation, hypertension, asthma, knee osteoarthritis  PHYSICAL EXAM: BP 125/79 mmHg  Pulse 68  Temp(Src) 98.1 F (36.7 C) (Oral)  Wt 216 lb (97.977 kg) Gen: NAD, pleasant, cooperative HEENT: normocephalic, atraumatic, moist mucous membranes  Heart: regular rate and rhythm, no murmur Lungs: clear to auscultation bilaterally, normal work of breathing  Neuro: alert, grossly nonfocal, speech normal Ext: No appreciable lower extremity edema bilaterally   ASSESSMENT/PLAN:  Health maintenance:  -counseled to schedule appointment with PCP to review health maintenance items (has many past due)  MENTAL RETARDATION Personal care services forms completed. Original copy given to sister, we will fax a copy into [facility] as well.   FOLLOW UP: Follow up in 4 weeks with PCP for review of health maintenance items  GrenadaBrittany J. Pollie MeyerMcIntyre, MD [facility]

## 2016-03-18 NOTE — Patient Instructions (Signed)
We will complete the personal care services forms & send them in.  Schedule a visit to talk about health prevention & health maintenance with Dr. Doroteo GlassmanPhelps whenever is convenient for you.  Be well, Dr. Pollie MeyerMcIntyre

## 2016-03-20 ENCOUNTER — Other Ambulatory Visit: Payer: Self-pay | Admitting: Obstetrics and Gynecology

## 2016-03-22 NOTE — Assessment & Plan Note (Signed)
Personal care services forms completed. Original copy given to sister, we will fax a copy into [facility] as well.

## 2016-03-22 NOTE — Assessment & Plan Note (Deleted)
Well controlled presently. Continue current inhaler regimen.

## 2016-03-22 NOTE — Assessment & Plan Note (Deleted)
Well-controlled.  Continue current regimen. 

## 2016-03-29 ENCOUNTER — Ambulatory Visit: Payer: Medicare Other | Admitting: Obstetrics and Gynecology

## 2016-03-29 ENCOUNTER — Other Ambulatory Visit: Payer: Self-pay | Admitting: Obstetrics and Gynecology

## 2016-03-29 ENCOUNTER — Other Ambulatory Visit: Payer: Self-pay | Admitting: *Deleted

## 2016-03-29 MED ORDER — TRAZODONE HCL 150 MG PO TABS: ORAL_TABLET | ORAL | Status: DC

## 2016-03-29 MED ORDER — ALBUTEROL SULFATE HFA 108 (90 BASE) MCG/ACT IN AERS: INHALATION_SPRAY | RESPIRATORY_TRACT | Status: DC

## 2016-03-29 NOTE — Telephone Encounter (Signed)
Mother called and states that she recently filled both the albuterol and trazadone for patient.  They seem to have lost these scripts when going from store to store.  She is unable to fill scripts early and can't afford to pay OOP for this either.  Would like to know if script can be changed in dosage for trazadone to take one tablet at bedtime and then she can double up as needed until the first of august when she can fill her script on file.  She also needs a new script for her albuterol inhaler also.  She is sure that it was dropped from the car and they were unable to find it.  Will forward to MD to advise. Jazmin Hartsell,CMA

## 2016-03-29 NOTE — Telephone Encounter (Signed)
Refills sent to pharmacy. Wrote a note to pharmacy stating trazadone can be filled early. This should work without me changing the dose.

## 2016-04-10 ENCOUNTER — Other Ambulatory Visit: Payer: Self-pay | Admitting: Obstetrics and Gynecology

## 2016-04-26 ENCOUNTER — Other Ambulatory Visit: Payer: Self-pay | Admitting: Obstetrics and Gynecology

## 2016-04-26 NOTE — Telephone Encounter (Signed)
Mother called and she needs a refill on her daughters Mobic. Please call in right away since this keeps her daughter active the center during the day. jw

## 2016-05-07 ENCOUNTER — Other Ambulatory Visit: Payer: Self-pay | Admitting: Obstetrics and Gynecology

## 2016-06-07 ENCOUNTER — Other Ambulatory Visit: Payer: Self-pay | Admitting: *Deleted

## 2016-06-07 MED ORDER — SERTRALINE HCL 50 MG PO TABS: 50.0000 mg | ORAL_TABLET | Freq: Every day | ORAL | 2 refills | Status: DC

## 2016-06-07 MED ORDER — TRAZODONE HCL 300 MG PO TABS: ORAL_TABLET | ORAL | 1 refills | Status: DC

## 2016-06-07 NOTE — Telephone Encounter (Signed)
Refill request for 90 day supply.  Martin, Tamika L, RN  

## 2016-06-14 ENCOUNTER — Other Ambulatory Visit: Payer: Self-pay | Admitting: *Deleted

## 2016-06-14 MED ORDER — LEVOCETIRIZINE DIHYDROCHLORIDE 5 MG PO TABS: 5.0000 mg | ORAL_TABLET | Freq: Every evening | ORAL | 4 refills | Status: DC

## 2016-06-15 ENCOUNTER — Other Ambulatory Visit: Payer: Self-pay | Admitting: Obstetrics and Gynecology

## 2016-06-16 ENCOUNTER — Telehealth: Payer: Self-pay | Admitting: *Deleted

## 2016-06-16 NOTE — Telephone Encounter (Signed)
Patient caregiver calling requesting spacer for her albuterol inhaler. [facility]

## 2016-06-17 ENCOUNTER — Other Ambulatory Visit: Payer: Self-pay | Admitting: Obstetrics and Gynecology

## 2016-06-17 MED ORDER — SPACER/AERO CHAMBER MOUTHPIECE MISC: 0 refills | Status: DC

## 2016-06-17 NOTE — Telephone Encounter (Signed)
Rx for spacer sent to pharmacy.

## 2016-06-17 NOTE — Progress Notes (Addendum)
   [facility] Phone: [phone]   Date of Visit: 06/18/2016   HPI:  Alyssa Powell is a 65 y.o. female presenting to clinic today for same day appointment. PCP: Caryl AdaJazma Phelps, DO Concerns today include: - patient is here with her CNA who helped provide history - reports of coughing up green mucus, rhinorrhea, nasal congestion since Tuesday  - reports symptoms are worse at night  - no sinus pain; no sob other then when cughing  - has history of asthma: takes Advair daily; reports using albuterol but unsure how often  - no fevers - reports of decreased appetite but is staying hydrated - sick contact, son with same symptoms   ROS: See HPI.  PMFSH:  PMH: Asthma  PHYSICAL EXAM: BP 124/77   Pulse 75   Temp 97.5 F (36.4 C) (Oral)   Wt 213 lb (96.6 kg)   BMI 37.73 kg/m  GEN: NAD, non-toxic appearing  HEENT: Atraumatic, normocephalic, neck supple, EOMI, sclera clear; MMM; pharynx with mild erythema and without exudates, no cervical lymphadenopathy; no sinus tenderness  CV: RRR, no murmurs, rubs, or gallops PULM: normal effort, expiratory wheezing diffusely but has good air movement, no crackles heard  SKIN: No rash or cyanosis; warm and well-perfused EXTR: No lower extremity edema or calf tenderness PSYCH: Mood and affect euthymic, normal rate and volume of speech NEURO: Awake, alert, no focal deficits grossly, normal speech  ASSESSMENT/PLAN:  Acute Asthma Exacerbation, Mild: No signs of PNA. Patient is not in acute distress and has normal effort with breathing but does have wheezing diffusely but with good air movement  - Prednisone 40mg  daily x 5 days - recommended scheduling Albuterol q 4-6 hours for 24 hours, then return to PRN  - strict return precautions discussed   Palma HolterKanishka G Betha Shadix, MD PGY [facility]

## 2016-06-18 ENCOUNTER — Ambulatory Visit (INDEPENDENT_AMBULATORY_CARE_PROVIDER_SITE_OTHER): Payer: Medicare Other | Admitting: Internal Medicine

## 2016-06-18 ENCOUNTER — Encounter: Payer: Self-pay | Admitting: Internal Medicine

## 2016-06-18 VITALS — BP 124/77 | HR 75 | Temp 97.5°F | Wt 213.0 lb

## 2016-06-18 DIAGNOSIS — J45901 Unspecified asthma with (acute) exacerbation: Secondary | ICD-10-CM

## 2016-06-18 DIAGNOSIS — B309 Viral conjunctivitis, unspecified: Secondary | ICD-10-CM | POA: Diagnosis present

## 2016-06-18 MED ORDER — PREDNISONE 20 MG PO TABS: 40.0000 mg | ORAL_TABLET | Freq: Every day | ORAL | 0 refills | Status: DC

## 2016-06-18 NOTE — Patient Instructions (Signed)
I think you have you a mild asthma flare. I prescribed you steroid mediation; please take as prescribed. You can take albuterol every 4-6 hours for the next 24 hours, then use as needed.

## 2016-06-23 ENCOUNTER — Other Ambulatory Visit: Payer: Self-pay | Admitting: Obstetrics and Gynecology

## 2016-06-24 ENCOUNTER — Encounter (HOSPITAL_COMMUNITY): Payer: Self-pay | Admitting: Emergency Medicine

## 2016-06-24 ENCOUNTER — Emergency Department (HOSPITAL_COMMUNITY)
Admission: EM | Admit: 2016-06-24 | Discharge: 2016-06-24 | Disposition: A | Discharge status: Home / Self Care | Payer: Medicare Other | Attending: Emergency Medicine | Admitting: Emergency Medicine

## 2016-06-24 ENCOUNTER — Other Ambulatory Visit: Payer: Self-pay

## 2016-06-24 ENCOUNTER — Emergency Department (HOSPITAL_COMMUNITY): Payer: Medicare Other

## 2016-06-24 DIAGNOSIS — Z79899 Other long term (current) drug therapy: Secondary | ICD-10-CM | POA: Insufficient documentation

## 2016-06-24 DIAGNOSIS — R0602 Shortness of breath: Secondary | ICD-10-CM

## 2016-06-24 DIAGNOSIS — R05 Cough: Secondary | ICD-10-CM | POA: Diagnosis not present

## 2016-06-24 DIAGNOSIS — R0600 Dyspnea, unspecified: Secondary | ICD-10-CM | POA: Diagnosis present

## 2016-06-24 DIAGNOSIS — J45901 Unspecified asthma with (acute) exacerbation: Secondary | ICD-10-CM | POA: Diagnosis not present

## 2016-06-24 DIAGNOSIS — I10 Essential (primary) hypertension: Secondary | ICD-10-CM | POA: Insufficient documentation

## 2016-06-24 LAB — CBC WITH DIFFERENTIAL/PLATELET
BASOS ABS: 0 10*3/uL (ref 0.0–0.1)
Basophils Relative: 0 %
EOS PCT: 1 %
Eosinophils Absolute: 0.1 10*3/uL (ref 0.0–0.7)
HEMATOCRIT: 44.1 % (ref 36.0–46.0)
HEMOGLOBIN: 14.5 g/dL (ref 12.0–15.0)
LYMPHS PCT: 21 %
Lymphs Abs: 2.4 10*3/uL (ref 0.7–4.0)
MCH: 28.9 pg (ref 26.0–34.0)
MCHC: 32.9 g/dL (ref 30.0–36.0)
MCV: 88 fL (ref 78.0–100.0)
Monocytes Absolute: 1.3 10*3/uL — ABNORMAL HIGH (ref 0.1–1.0)
Monocytes Relative: 11 %
NEUTROS ABS: 7.6 10*3/uL (ref 1.7–7.7)
NEUTROS PCT: 67 %
PLATELETS: 297 10*3/uL (ref 150–400)
RBC: 5.01 MIL/uL (ref 3.87–5.11)
RDW: 12.9 % (ref 11.5–15.5)
WBC: 11.4 10*3/uL — AB (ref 4.0–10.5)

## 2016-06-24 LAB — BASIC METABOLIC PANEL
ANION GAP: 8 (ref 5–15)
BUN: 20 mg/dL (ref 6–20)
CO2: 30 mmol/L (ref 22–32)
Calcium: 9.2 mg/dL (ref 8.9–10.3)
Chloride: 100 mmol/L — ABNORMAL LOW (ref 101–111)
Creatinine, Ser: 1.13 mg/dL — ABNORMAL HIGH (ref 0.44–1.00)
GFR calc Af Amer: 57 mL/min — ABNORMAL LOW (ref >60)
GFR, EST NON AFRICAN AMERICAN: 50 mL/min — AB (ref >60)
GLUCOSE: 154 mg/dL — AB (ref 65–99)
POTASSIUM: 3.2 mmol/L — AB (ref 3.5–5.1)
Sodium: 138 mmol/L (ref 135–145)

## 2016-06-24 LAB — BRAIN NATRIURETIC PEPTIDE: B Natriuretic Peptide: 158.2 pg/mL — ABNORMAL HIGH (ref 0.0–100.0)

## 2016-06-24 LAB — TROPONIN I: Troponin I: <0.03 ng/mL (ref <0.03)

## 2016-06-24 MED ORDER — SODIUM CHLORIDE 0.9 % IV SOLN
INTRAVENOUS | Status: DC
  Administered 2016-06-24: 19:00:00 via INTRAVENOUS

## 2016-06-24 MED ORDER — ALBUTEROL (5 MG/ML) CONTINUOUS INHALATION SOLN
10.0000 mg/h | INHALATION_SOLUTION | Freq: Once | RESPIRATORY_TRACT | Status: AC
  Administered 2016-06-24: 10 mg/h via RESPIRATORY_TRACT
  Filled 2016-06-24: qty 20

## 2016-06-24 MED ORDER — PREDNISONE 50 MG PO TABS: ORAL_TABLET | ORAL | 0 refills | Status: DC

## 2016-06-24 MED ORDER — IPRATROPIUM BROMIDE 0.02 % IN SOLN
0.5000 mg | Freq: Once | RESPIRATORY_TRACT | Status: AC
  Administered 2016-06-24: 0.5 mg via RESPIRATORY_TRACT
  Filled 2016-06-24: qty 2.5

## 2016-06-24 MED ORDER — AEROCHAMBER Z-STAT PLUS/MEDIUM MISC
Status: AC
  Administered 2016-06-24: 22:00:00
  Filled 2016-06-24: qty 1

## 2016-06-24 MED ORDER — POTASSIUM CHLORIDE CRYS ER 20 MEQ PO TBCR
40.0000 meq | EXTENDED_RELEASE_TABLET | Freq: Once | ORAL | Status: AC
  Administered 2016-06-24: 40 meq via ORAL
  Filled 2016-06-24: qty 2

## 2016-06-24 MED ORDER — METHYLPREDNISOLONE SODIUM SUCC 125 MG IJ SOLR
125.0000 mg | Freq: Once | INTRAMUSCULAR | Status: AC
  Administered 2016-06-24: 125 mg via INTRAVENOUS
  Filled 2016-06-24: qty 2

## 2016-06-24 NOTE — Discharge Instructions (Signed)
Take 2 puffs of your inhaler every 3-4 hours and return if you have any trouble breathing

## 2016-06-24 NOTE — ED Notes (Addendum)
Pt stayed above 92% during 'road test'

## 2016-06-24 NOTE — ED Notes (Signed)
Patient is alert and oriented x3.  She was given DC instructions and follow up visit instructions.  Patient gave verbal understanding. She was DC ambulatory under her own power to home.  V/S stable.  He was not showing any signs of distress on DC 

## 2016-06-24 NOTE — ED Triage Notes (Signed)
Pt reports cough and SOB for the past 2 weeks. Went to PCP earlier this week and was given Rx for short course of steroids. Pt felt better with steroid, but symptoms returned after finishing course. Slight expiratory wheezing in triage, O2 sat 95%, RR 14. Hx of asthma.

## 2016-06-24 NOTE — ED Notes (Signed)
Respiratory called for breathing treatment.

## 2016-06-24 NOTE — ED Provider Notes (Signed)
[facility]-EMERGENCY DEPT Provider Note   CSN: [id number] Arrival date & time: 06/24/16  1612     History   Chief Complaint Chief Complaint  Patient presents with  . Asthma    HPI Alyssa Powell is a 65 y.o. female.  66 year old female presents with 2 weeks of dyspnea with productive cough. Denies any orthopnea. No substernal chest pain. Does note some dyspnea on exertion. Cough productive of green-yellow sputum. Does have a history of asthma and has been using her home inhaler without relief. Saw her physician for similar symptoms 7 days ago and was scribed in [employer] which she just finished. Still continues to not feel well. She denies a prior history of CHF. Does not use tobacco products currently      Past Medical History:  Diagnosis Date  . Allergy   . Asthma   . Hypertension   . Mental retardation     Patient Active Problem List   Diagnosis Date Noted  . Cough 12/03/2015  . Papular rash, localized 05/17/2012  . Osteoarthritis, knee 12/16/2011  . Allergic rhinitis 08/07/2009  . OBESITY, NOS 11/17/2006  . MENTAL RETARDATION 11/17/2006  . Essential hypertension, benign 11/17/2006  . Asthma, chronic 11/17/2006    History reviewed. No pertinent surgical history.  OB History    No data available       Home Medications    Prior to Admission medications   Medication Sig Start Date End Date Taking? Authorizing Provider  ADVAIR DISKUS 500-50 MCG/DOSE AEPB INHALE 1 PUFF BY MOUTH INTO LUNGS TWICE DAILY 05/07/16   Pincus Large, DO  albuterol (PROAIR HFA) 108 (90 Base) MCG/ACT inhaler INHALE 2 PUFFS INTO THE LUNGS EVERY 4 (FOUR) HOURS AS NEEDED FOR WHEEZING. 03/29/16   Pincus Large, DO  amLODipine (NORVASC) 5 MG tablet Take 1 tablet (5 mg total) by mouth daily. 08/11/15   Alyssa A Haney, MD  fluticasone (FLONASE) 50 MCG/ACT nasal spray USE 2 SPRAYS INTO THE NOSE DAILY. 01/08/15   Pincus Large, DO  hydrochlorothiazide (HYDRODIURIL) 25 MG tablet Take 1 tablet (25 mg total)  by mouth daily. 08/11/15   Bonney Aid, MD  levocetirizine (XYZAL) 5 MG tablet Take 1 tablet (5 mg total) by mouth every evening. 06/14/16   Pincus Large, DO  meloxicam (MOBIC) 7.5 MG tablet TAKE 1 TABLET (7.5 MG TOTAL) BY MOUTH DAILY. 06/23/16   Pincus Large, DO  Olopatadine HCl 0.2 % SOLN 1 gtt OU QD 07/31/14   Bryan R Hess, DO  omeprazole (PRILOSEC) 20 MG capsule TAKE 1 CAPSULE (20 MG TOTAL) BY MOUTH DAILY. 06/16/16   Pincus Large, DO  predniSONE (DELTASONE) 20 MG tablet Take 2 tablets (40 mg total) by mouth daily with breakfast. 06/18/16   Palma Holter, MD  sertraline (ZOLOFT) 50 MG tablet Take 1 tablet (50 mg total) by mouth daily. 06/07/16   Pincus Large, DO  Spacer/Aero Chamber Mouthpiece MISC Use spacer with inhaler as directed. 06/17/16   Pincus Large, DO  traZODone (DESYREL) 300 MG tablet TAKE 1 TABLET BY MOUTH AT BEDTIME AS NEEDED SLEEP 06/07/16   Pincus Large, DO    Family History History reviewed. No pertinent family history.  Social History Social History  Substance Use Topics  . Smoking status: Never Smoker  . Smokeless tobacco: Not on file  . Alcohol use No     Allergies   Amoxicillin and Codeine phosphate   Review of Systems Review of Systems  All other  systems reviewed and are negative.    Physical Exam Updated Vital Signs BP 140/77 (BP Location: Left Arm)   Pulse 84   Temp 98.3 F (36.8 C) (Oral)   Resp 18   SpO2 100%   Physical Exam  Constitutional: She is oriented to person, place, and time. She appears well-developed and well-nourished.  Non-toxic appearance. No distress.  HENT:  Head: Normocephalic and atraumatic.  Eyes: Conjunctivae, EOM and lids are normal. Pupils are equal, round, and reactive to light.  Neck: Normal range of motion. Neck supple. No tracheal deviation present. No thyroid mass present.  Cardiovascular: Normal rate, regular rhythm and normal heart sounds.  Exam reveals no gallop.   No murmur  heard. Pulmonary/Chest: Effort normal. No stridor. No respiratory distress. She has no decreased breath sounds. She has wheezes. She has rhonchi. She has no rales.  Abdominal: Soft. Normal appearance and bowel sounds are normal. She exhibits no distension. There is no tenderness. There is no rebound and no CVA tenderness.  Musculoskeletal: Normal range of motion. She exhibits no edema or tenderness.  Neurological: She is alert and oriented to person, place, and time. She has normal strength. No cranial nerve deficit or sensory deficit. GCS eye subscore is 4. GCS verbal subscore is 5. GCS motor subscore is 6.  Skin: Skin is warm and dry. No abrasion and no rash noted.  Psychiatric: She has a normal mood and affect. Her speech is normal and behavior is normal.  Nursing note and vitals reviewed.    ED Treatments / Results  Labs (all labs ordered are listed, but only abnormal results are displayed) Labs Reviewed  CBC WITH DIFFERENTIAL/PLATELET  BASIC METABOLIC PANEL  TROPONIN I  BRAIN NATRIURETIC PEPTIDE    EKG  EKG Interpretation None       Radiology No results found.  Procedures Procedures (including critical care time)  Medications Ordered in ED Medications  0.9 %  sodium chloride infusion (not administered)  albuterol (PROVENTIL,VENTOLIN) solution continuous neb (not administered)  methylPREDNISolone sodium succinate (SOLU-MEDROL) 125 mg/2 mL injection 125 mg (not administered)  ipratropium (ATROVENT) nebulizer solution 0.5 mg (not administered)     Initial Impression / Assessment and Plan / ED Course  I have reviewed the triage vital signs and the nursing notes.  Pertinent labs & imaging results that were available during my care of the patient were reviewed by me and considered in my medical decision making (see chart for details).  Clinical Course    Patient given 10 mg albuterol treatment over one hour. She is also given a dose of Solu-Medrol. Her dyspnea is  greatly improved. Her wheezing has diminished. She was walked in the department and maintained a pulse oximetry of 92% and she would like to go home at this time.. Patient instructed to use her inhaler every 4 hours and will be given a course of prednisone  Final Clinical Impressions(s) / ED Diagnoses   Final diagnoses:  SOB (shortness of breath)    New Prescriptions New Prescriptions   No medications on file     Lorre NickAnthony Allenmichael Mcpartlin, MD 06/24/16 2206

## 2016-06-24 NOTE — ED Notes (Signed)
MD at bedside. 

## 2016-07-01 ENCOUNTER — Ambulatory Visit: Payer: Medicare Other | Admitting: Obstetrics and Gynecology

## 2016-07-15 ENCOUNTER — Other Ambulatory Visit: Payer: Self-pay | Admitting: Obstetrics and Gynecology

## 2016-08-03 ENCOUNTER — Encounter: Payer: Self-pay | Admitting: Student

## 2016-08-03 ENCOUNTER — Ambulatory Visit (INDEPENDENT_AMBULATORY_CARE_PROVIDER_SITE_OTHER): Payer: Medicare Other | Admitting: Student

## 2016-08-03 DIAGNOSIS — J45909 Unspecified asthma, uncomplicated: Secondary | ICD-10-CM

## 2016-08-03 DIAGNOSIS — J069 Acute upper respiratory infection, unspecified: Secondary | ICD-10-CM

## 2016-08-03 DIAGNOSIS — B309 Viral conjunctivitis, unspecified: Secondary | ICD-10-CM | POA: Diagnosis present

## 2016-08-03 DIAGNOSIS — B9789 Other viral agents as the cause of diseases classified elsewhere: Secondary | ICD-10-CM

## 2016-08-03 NOTE — Progress Notes (Signed)
weezs

## 2016-08-03 NOTE — Progress Notes (Signed)
   Subjective:    Patient ID: Alyssa Powell is a 65 y.o. old female.  HPI #Cough: this has been going on for two weeks. Cough is productive with whitish phelgm. She denies hemoptysis. She reports cough and wheeze with season changes. Reports shortness of breath only when she cough. Reports runny nose, congestion and hoarse sounds. Her cough is worse after dinner time. Eats dinner about 7 pm and goes to bed about 10 pm. Denies heart burn or reflux. Denies postnasal dripping. Denies fever, chills,  chest pain, orthopnea, edema, nausea, vomiting or diarrhea,  Uses Flonase everyday that helps during the day. Also tried robutassin DM which helped a little bit.  Also uses albuterol  twice a day every day.   PMH: reviewed  SH: denies smoking cigarettes, drinking alcohol or using recreational drug  Review of Systems Per HPI Objective:   Vitals:   08/03/16 1437  BP: 119/69  Pulse: 71  Temp: 97.4 F (36.3 C)  TempSrc: Axillary  SpO2: 95%  Weight: 208 lb 9.6 oz (94.6 kg)  Height: 5\' 3"  (1.6 m)    GEN: appears well, no apparent distress. Eyes: without conjunctival injection, sclera anicteric Ears: normal TM and ear canal,  Nares: no rhinorrhea, mild congestion, no erythema,  Oropharynx: mmm without erythema or exudation HEM: Mobile lymph node in right anterior cervical triangle about 1 cm long. No tenderness or overlying skin erythema CVS: RRR, normal s1 and s2, no murmurs, no edema RESP: no increased work of breathing, good air movement bilaterally, no crackles or wheeze SKIN: No apparent skin lesion NEURO: alert and oriented appropriately, no gross defecits  PSYCH: appropriate mood and affect     Assessment & Plan:  Viral URI with cough Signs and symptoms suggestive for viral URI. She is not in respiratory distress. Low suspicion for asthma exacerbation. Discussed supportive management as in AVS. Advised her to use the albuterol only as needed for shortness of breath or cough.  Discussed return precautions.

## 2016-08-03 NOTE — Patient Instructions (Addendum)
It appears that you have a viral upper respiratory infection (cold).  Cold symptoms can last up to 2 weeks.  Cough may last upto 6 weeks.   - Get plenty of rest and adequate hydration. - Consume warm fluids (soup or tea) to provide relief for a stuffy nose and to loosen phlegm. - For nasal stuffiness, try saline nasal spray or a Neti Pot. - For sore throat : suck on throat lozenges, hard candy or popsicles; gargle with warm salt water (1/4 tsp. salt per 8 oz. of water) - Eat a well-balanced diet. If you cannot, ensure you are getting enough nutrients by taking a daily multivitamin. - Avoid dairy products, as they can thicken phlegm. - Avoid alcohol, as it impairs your body's immune system. - Try steam inhalation and a tablespoonful of honey before bed for cough - Use albuterol as needed for shortness of breath.   CONTACT YOUR DOCTOR IF YOU EXPERIENCE ANY OF THE FOLLOWING: - High fever, chest pain, shortness of breath or  not able to keep down food or fluids.  - Flare up of any chronic lung problem, such as asthma - Your symptoms persist longer than 2 weeks

## 2016-08-04 DIAGNOSIS — B9789 Other viral agents as the cause of diseases classified elsewhere: Secondary | ICD-10-CM

## 2016-08-04 DIAGNOSIS — J069 Acute upper respiratory infection, unspecified: Secondary | ICD-10-CM | POA: Insufficient documentation

## 2016-08-04 NOTE — Assessment & Plan Note (Deleted)
Signs and symptoms suggestive for viral URI. She is not in respiratory distress. Low suspicion for asthma exacerbation. Discussed supportive management as in AVS. Advised her to use the albuterol only as needed for shortness of breath or cough. Discussed return precautions.  

## 2016-08-04 NOTE — Assessment & Plan Note (Signed)
Signs and symptoms suggestive for viral URI. She is not in respiratory distress. Low suspicion for asthma exacerbation. Discussed supportive management as in AVS. Advised her to use the albuterol only as needed for shortness of breath or cough. Discussed return precautions.

## 2016-08-20 ENCOUNTER — Other Ambulatory Visit: Payer: Self-pay | Admitting: Family Medicine

## 2016-08-20 DIAGNOSIS — J069 Acute upper respiratory infection, unspecified: Secondary | ICD-10-CM

## 2016-08-20 DIAGNOSIS — J4540 Moderate persistent asthma, uncomplicated: Secondary | ICD-10-CM

## 2016-08-23 ENCOUNTER — Telehealth: Payer: Self-pay | Admitting: Obstetrics and Gynecology

## 2016-08-23 NOTE — Telephone Encounter (Signed)
Will forward to Md. Dustin Burrill,CMA  

## 2016-08-23 NOTE — Telephone Encounter (Signed)
Mother says a form from adult care was faxed here "awhile ago".  Mom says the medical form needs to be filled out before she can go back.  Please advise.

## 2016-08-24 NOTE — Telephone Encounter (Signed)
LM for mother letting her know that form was faxed today. Jazmin Hartsell,CMA

## 2016-08-24 NOTE — Telephone Encounter (Signed)
Mother was informed and said thank you so much, she really appreciates it. ep

## 2016-08-24 NOTE — Telephone Encounter (Signed)
Form completed and placed in the fax pile.

## 2016-08-25 MED ORDER — OMEPRAZOLE 20 MG PO CPDR: DELAYED_RELEASE_CAPSULE | ORAL | 3 refills | Status: DC

## 2016-09-17 ENCOUNTER — Other Ambulatory Visit: Payer: Self-pay | Admitting: Family Medicine

## 2016-09-17 DIAGNOSIS — J069 Acute upper respiratory infection, unspecified: Secondary | ICD-10-CM

## 2016-09-17 DIAGNOSIS — J4540 Moderate persistent asthma, uncomplicated: Secondary | ICD-10-CM

## 2016-10-16 ENCOUNTER — Other Ambulatory Visit: Payer: Self-pay | Admitting: Obstetrics and Gynecology

## 2016-10-19 ENCOUNTER — Encounter: Payer: Self-pay | Admitting: Obstetrics and Gynecology

## 2016-11-18 ENCOUNTER — Other Ambulatory Visit: Payer: Self-pay | Admitting: Obstetrics and Gynecology

## 2016-12-03 ENCOUNTER — Telehealth: Payer: Self-pay | Admitting: Obstetrics and Gynecology

## 2016-12-03 MED ORDER — ALBUTEROL SULFATE HFA 108 (90 BASE) MCG/ACT IN AERS: 2.0000 | INHALATION_SPRAY | RESPIRATORY_TRACT | 2 refills | Status: DC | PRN

## 2016-12-03 NOTE — Telephone Encounter (Signed)
Spoke with mother and she just wanted to confirm that proair and ventolin were the same medications.  Also she said that they were only given 1 inhaler at the last fill and needed to have 2 dispensed so she can take 1 to the center she attends daily.  Sent in another script this time documenting why she needed another inhaler. Alyssa Powell,CMA

## 2016-12-03 NOTE — Telephone Encounter (Signed)
Pt was given ventolin inhaler instead of the proair.  Because of Alyssa Powell mental conditon, she cannot learn how to use the ventolin inhaler. Mom would like to be put back on the proair inhaler. Please advise

## 2016-12-06 NOTE — Telephone Encounter (Signed)
Sounds like this was taken care of. Let me know if I need to do anything. Thanks!

## 2016-12-21 ENCOUNTER — Other Ambulatory Visit: Payer: Self-pay | Admitting: *Deleted

## 2016-12-21 MED ORDER — HYDROCHLOROTHIAZIDE 25 MG PO TABS: 25.0000 mg | ORAL_TABLET | Freq: Every day | ORAL | 0 refills | Status: DC

## 2016-12-21 NOTE — Telephone Encounter (Signed)
Need to follow-up with me for further refills. Has not been seen for blood work or general check-up in a couple years.

## 2016-12-21 NOTE — Telephone Encounter (Signed)
Attempted to contact pt- no answer and VM was left to call the office to schedule a check up for further refills.

## 2016-12-21 NOTE — Telephone Encounter (Signed)
Hasnt had medicine in a month. Wants it sent in ASAP. Deseree Bruna Potter, CMA

## 2017-01-25 ENCOUNTER — Other Ambulatory Visit: Payer: Self-pay | Admitting: *Deleted

## 2017-01-26 MED ORDER — FLUTICASONE-SALMETEROL 500-50 MCG/DOSE IN AEPB: INHALATION_SPRAY | RESPIRATORY_TRACT | 2 refills | Status: DC

## 2017-02-01 ENCOUNTER — Other Ambulatory Visit: Payer: Self-pay | Admitting: Obstetrics and Gynecology

## 2017-02-19 ENCOUNTER — Other Ambulatory Visit: Payer: Self-pay | Admitting: Obstetrics and Gynecology

## 2017-02-21 NOTE — Telephone Encounter (Signed)
Just filled last month 01/26/17 with refills.

## 2017-02-22 ENCOUNTER — Other Ambulatory Visit: Payer: Self-pay | Admitting: Obstetrics and Gynecology

## 2017-02-22 ENCOUNTER — Telehealth: Payer: Self-pay | Admitting: Obstetrics and Gynecology

## 2017-03-12 ENCOUNTER — Other Ambulatory Visit: Payer: Self-pay | Admitting: Obstetrics and Gynecology

## 2017-04-06 ENCOUNTER — Other Ambulatory Visit: Payer: Self-pay | Admitting: Obstetrics and Gynecology

## 2017-04-11 ENCOUNTER — Telehealth: Payer: Self-pay | Admitting: Family Medicine

## 2017-04-11 NOTE — Telephone Encounter (Signed)
Mom called stating she had a missed call from us, nothing in the notes and I also asked Jazmin. Mom states it might have been about some paperwork from pt's social worker. ep

## 2017-04-11 NOTE — Telephone Encounter (Signed)
Will forward to Dr. Wonda Oldsiccio to see if she has received anything regarding this patient and a form.  Jazmin Hartsell,CMA

## 2017-04-11 NOTE — Telephone Encounter (Signed)
I refilled her zoloft on the 18th, could that be it? Have not seen any paperwork from social work regarding this patient.

## 2017-04-11 NOTE — Telephone Encounter (Signed)
Spoke with mother and she is aware of this. Conor Filsaime,CMA

## 2017-04-12 ENCOUNTER — Other Ambulatory Visit: Payer: Self-pay | Admitting: Obstetrics and Gynecology

## 2017-04-15 ENCOUNTER — Telehealth: Payer: Self-pay | Admitting: Family Medicine

## 2017-04-15 NOTE — Telephone Encounter (Signed)
Spoke with pt's daughter Alyssa Powell. Has pt received a mammogram in the past 2 years? No Did she get a referal, or cancel/no show appt? If yes to any, why? Pt has been busy, but plans on scheduling appt when her home aid gets back from vacation.  I gave Alyssa Powell the number for [facility] and request she make an appt for pt with them. Mary understood and plans on sending the results after appt.

## 2017-05-13 ENCOUNTER — Other Ambulatory Visit: Payer: Self-pay | Admitting: Obstetrics and Gynecology

## 2017-05-20 ENCOUNTER — Telehealth: Payer: Self-pay | Admitting: *Deleted

## 2017-05-20 NOTE — Telephone Encounter (Signed)
Mrs. Alyssa Powell from [facility] needs Dr. Wonda Oldsiccio to call her back to clarify some issues on the patients FL2 form.  You can reach her @ [phone]. Valori Hollenkamp, Maryjo RochesterJessica Dawn, CMA

## 2017-05-24 NOTE — Telephone Encounter (Signed)
Patient mother calling again, would like a call back as soon as possible.

## 2017-05-25 NOTE — Telephone Encounter (Signed)
Dr. Wonda Oldsiccio is out of the office until 9/6. Will let her know form needs to be filled out ASAP.

## 2017-06-13 ENCOUNTER — Ambulatory Visit (INDEPENDENT_AMBULATORY_CARE_PROVIDER_SITE_OTHER): Payer: Medicare Other | Admitting: Family Medicine

## 2017-06-13 ENCOUNTER — Encounter: Payer: Self-pay | Admitting: Family Medicine

## 2017-06-13 VITALS — BP 118/63 | HR 61 | Temp 98.3°F | Wt 207.0 lb

## 2017-06-13 DIAGNOSIS — J45909 Unspecified asthma, uncomplicated: Secondary | ICD-10-CM

## 2017-06-13 DIAGNOSIS — F70 Mild intellectual disabilities: Secondary | ICD-10-CM | POA: Diagnosis not present

## 2017-06-13 MED ORDER — DEXTROMETHORPHAN HBR 15 MG/5ML PO SYRP: 10.0000 mL | ORAL_SOLUTION | Freq: Four times a day (QID) | ORAL | 0 refills | Status: DC | PRN

## 2017-06-13 MED ORDER — PREDNISONE 50 MG PO TABS: ORAL_TABLET | ORAL | 0 refills | Status: DC

## 2017-06-13 NOTE — Progress Notes (Signed)
    Subjective:    Patient ID: Alyssa Powell, female    DOB: Oct 02, 1950, 66 y.o.   MRN: [id number]   CC: follow up FL2 forms and asthma/cough  Cough Per mom has been having increased cough for the past 2 weeks. It is keeping her up at night. It is not productive. Mom endorses wheezing worse at night. She has been giving Alyssa Powell her albuterol inhaler every 4-6 hours. Using advair as instructed. Denies any fevers or chills. No recent illnesses. Has been using mucinex as well but seems unable to clear up cough.   FL2 Forms Forms recently filled out however Alyssa Powell was denied for her adult care services. Asking for them to be corrected so she is approved for these. Contact person Ms. Fullmore- CSW.   Smoking status reviewed- non-smoker   Review of Systems- see HPI   Objective:  BP 118/63   Pulse 61   Temp 98.3 F (36.8 C) (Oral)   Wt 207 lb (93.9 kg)   SpO2 90%   BMI 36.67 kg/m  Vitals and nursing note reviewed  General: well nourished, in no acute distress HEENT: normocephalic, no nasal discharge, moist mucous membranes, poor dentition without erythema or discharge noted in posterior oropharynx Neck: supple, non-tender, without lymphadenopathy Cardiac: RRR, clear S1 and S2, no murmurs, rubs, or gallops Respiratory: clear to auscultation bilaterally, no increased work of breathing Extremities: no edema or cyanosis. Skin: warm and dry, no rashes noted  Assessment & Plan:    Asthma, chronic  Cough persistent, no wheezing on exam, good air movement bilaterally. Afebrile.   -rx given for cough syrup to use QID as needed for cough -will give 5 day course of steroids -continue albuterol and advair -follow up if cough worsens or fever develops  Mild intellectual disability  Discussed need for care with CSW Ms. Donivan Scull, FL2 forms filled out and will be faxed to her office    Return if symptoms worsen or fail to improve.   Dolores Patty, DO Family Medicine Resident  PGY-2

## 2017-06-13 NOTE — Patient Instructions (Signed)
   It was great seeing you today!  I have left a message for Ms. Fullmore to help sort out these forms and get you qualified for your program.   Please take the steroid one pill a day for 5 days. I have also sent in a prescription for cough syrup. I hope you feel better soon!  If you have questions or concerns please do not hesitate to call at [phone].  Dolores Patty, DO PGY-2, [facility] 06/13/2017 2:14 PM

## 2017-06-14 NOTE — Assessment & Plan Note (Signed)
  Discussed need for care with CSW Ms. Lenore Manner forms filled out and will be faxed to her office

## 2017-06-14 NOTE — Assessment & Plan Note (Signed)
  Cough persistent, no wheezing on exam, good air movement bilaterally. Afebrile.   -rx given for cough syrup to use QID as needed for cough -will give 5 day course of steroids -continue albuterol and advair -follow up if cough worsens or fever develops

## 2017-06-15 ENCOUNTER — Other Ambulatory Visit: Payer: Self-pay | Admitting: Obstetrics and Gynecology

## 2017-07-13 ENCOUNTER — Telehealth: Payer: Self-pay | Admitting: *Deleted

## 2017-07-13 NOTE — Telephone Encounter (Signed)
Mom lm on nurse line again.   Still cant tell me the name of the medication.  States that "she gets it every year at this time"  Cloy Cozzens, Maryjo RochesterJessica Dawn, CMA

## 2017-07-13 NOTE — Telephone Encounter (Signed)
Pt mom lm on nurse line. She needs a "refill on the med her daughter gets everytime at this year".    Called and LM for mom to call back for clarification.  Need to know what med it is. Anjelica Gorniak, Maryjo RochesterJessica Dawn, CMA

## 2017-07-14 ENCOUNTER — Other Ambulatory Visit: Payer: Self-pay | Admitting: Family Medicine

## 2017-07-14 MED ORDER — PSEUDOEPHEDRINE-GUAIFENESIN ER 60-600 MG PO TB12: 1.0000 | ORAL_TABLET | Freq: Two times a day (BID) | ORAL | 2 refills | Status: DC

## 2017-07-14 NOTE — Telephone Encounter (Signed)
Please inform her she can buy mucinex DM over the counter. This is the medication that was prescribed for the past several years for cough and congestion. Thanks.

## 2017-07-14 NOTE — Telephone Encounter (Signed)
Called patient's mother to ask about the medicine she needs. Mom states she needs a cough medicine. She cannot remember the name of the medicine. Will look through chart and send in medicine.  Dolores PattyAngela Riccio, DO PGY-2, [facility] 07/14/2017 11:07 AM

## 2017-07-14 NOTE — Telephone Encounter (Signed)
Pt states that medication is $17 dollars and she cant afford it.  Yer Olivencia, Alyssa RochesterJessica Dawn, CMA

## 2017-08-15 ENCOUNTER — Ambulatory Visit (INDEPENDENT_AMBULATORY_CARE_PROVIDER_SITE_OTHER): Payer: Medicare Other | Admitting: Podiatry

## 2017-08-15 ENCOUNTER — Telehealth: Payer: Self-pay | Admitting: *Deleted

## 2017-08-15 DIAGNOSIS — M79675 Pain in left toe(s): Secondary | ICD-10-CM | POA: Diagnosis not present

## 2017-08-15 DIAGNOSIS — B351 Tinea unguium: Secondary | ICD-10-CM | POA: Diagnosis not present

## 2017-08-15 DIAGNOSIS — M79674 Pain in right toe(s): Secondary | ICD-10-CM | POA: Diagnosis not present

## 2017-08-15 NOTE — Progress Notes (Signed)
Subjective:    Patient ID: Alyssa Powell, female    DOB: 08/01/1953, 66 y.o.   MRN: [id number]  HPI Alyssa Powell presents the office today for concerns of thick, painful, elongated toenails that she cannot trim herself.  She presents today with her family member.  She denies any redness or drainage from the nail sites and she and her family member have no other concerns today.   Review of Systems  All other systems reviewed and are negative.  Past Medical History:  Diagnosis Date  . Allergy   . Asthma   . Hypertension   . Mental retardation     No past surgical history on file.   Current Outpatient Medications:  .  ADVAIR DISKUS 500-50 MCG/DOSE AEPB, INHALE 1 PUFF INTO LUNGS TWICE A DAY, Disp: 60 each, Rfl: 3 .  albuterol (PROVENTIL HFA;VENTOLIN HFA) 108 (90 Base) MCG/ACT inhaler, Inhale 2 puffs into the lungs every 4 (four) hours as needed for wheezing or shortness of breath., Disp: 2 Inhaler, Rfl: 6 .  amLODipine (NORVASC) 5 MG tablet, Take 1 tablet (5 mg total) by mouth daily., Disp: 30 tablet, Rfl: 3 .  dextromethorphan 15 MG/5ML syrup, Take 10 mLs (30 mg total) by mouth 4 (four) times daily as needed for cough., Disp: 120 mL, Rfl: 0 .  fluticasone (FLONASE) 50 MCG/ACT nasal spray, USE 2 SPRAYS INTO THE NOSE DAILY., Disp: 16 g, Rfl: 1 .  hydrochlorothiazide (HYDRODIURIL) 25 MG tablet, TAKE 1 TABLET (25 MG TOTAL) BY MOUTH DAILY. NEED PCP FOLLOW-UP FOR FURTHER REFILLS, Disp: 30 tablet, Rfl: 0 .  levocetirizine (XYZAL) 5 MG tablet, TAKE 1 TABLET (5 MG TOTAL) BY MOUTH EVERY EVENING., Disp: 30 tablet, Rfl: 4 .  meloxicam (MOBIC) 7.5 MG tablet, TAKE 1 TABLET (7.5 MG TOTAL) BY MOUTH DAILY., Disp: 60 tablet, Rfl: 1 .  Olopatadine HCl 0.2 % SOLN, 1 gtt OU QD, Disp: 2.5 mL, Rfl: 11 .  omeprazole (PRILOSEC) 20 MG capsule, TAKE 1 CAPSULE EVERY DAY, Disp: 30 capsule, Rfl: 3 .  predniSONE (DELTASONE) 50 MG tablet, One by mouth daily 5 days, Disp: 5 tablet, Rfl: 0 .  pseudoephedrine-guaifenesin  (MUCINEX D) 60-600 MG 12 hr tablet, Take 1 tablet by mouth every 12 (twelve) hours., Disp: 30 tablet, Rfl: 2 .  sertraline (ZOLOFT) 50 MG tablet, TAKE 1 TABLET BY MOUTH DAILY., Disp: 90 tablet, Rfl: 2 .  Spacer/Aero Chamber Mouthpiece MISC, Use spacer with inhaler as directed., Disp: 2 each, Rfl: 0 .  traZODone (DESYREL) 150 MG tablet, TAKE 2 TABLETS BY MOUTH AT BEDTIME AS NEEDED SLEEP, Disp: 180 tablet, Rfl: 1  Allergies  Allergen Reactions  . Amoxicillin Hives  . Codeine Phosphate Hives    Social History   Socioeconomic History  . Marital status: Married    Spouse name: Not on file  . Number of children: Not on file  . Years of education: Not on file  . Highest education level: Not on file  Social Needs  . Financial resource strain: Not on file  . Food insecurity - worry: Not on file  . Food insecurity - inability: Not on file  . Transportation needs - medical: Not on file  . Transportation needs - non-medical: Not on file  Occupational History  . Not on file  Tobacco Use  . Smoking status: Never Smoker  . Smokeless tobacco: Never Used  Substance and Sexual Activity  . Alcohol use: No  . Drug use: No  . Sexual activity: No  Other Topics  Concern  . Not on file  Social History Narrative  . Not on file        Objective:   Physical Exam General: NAD  Dermatological: Nails are hypertrophic, dystrophic, brittle, discolored, elongated 10. No surrounding redness or drainage. Tenderness nails 1-5 bilaterally. No open lesions or pre-ulcerative lesions are identified today.  Vascular: Dorsalis Pedis artery and Posterior Tibial artery pedal pulses are 2/4 bilateral with immedate capillary fill time. There is no pain with calf compression, swelling, warmth, erythema.   Neruologic: Grossly intact via light touch bilateral.  Protective threshold with Semmes Wienstein monofilament intact to all pedal sites bilateral.   Musculoskeletal: No gross boney pedal deformities  bilateral. No pain, crepitus, or limitation noted with foot and ankle range of motion bilateral. Muscular strength 5/5 in all groups tested bilateral.      Assessment & Plan:  66 year old female with symptomatic onychomycosis -Treatment options discussed including all alternatives, risks, and complications -Etiology of symptoms were discussed -Nails debrided 10 without complications or bleeding. -Daily foot inspection -Follow-up in 3 months or sooner if any problems arise. In the meantime, encouraged to call the office with any questions, concerns, change in symptoms.   Ovid CurdMatthew Wagoner, DPM

## 2017-08-15 NOTE — Telephone Encounter (Signed)
Pt mom lm on nurse line.    1. She is sending a form over that needs to be completed from her adult daycare. She is having it sent by fax.  2. Refill on flonase.    Maysoon Lozada, Maryjo RochesterJessica Dawn, CMA

## 2017-08-17 NOTE — Telephone Encounter (Signed)
Mother called and states that [facility] received the fax back stating that patient wasn't established here.  Mother wanted to verify that we did receive the form but that our computer system had the wrong birthday documented for the patient.  After checking patient's insurance card, it did confirm that d.o.b of [birth year] was incorrect and it needed to be changed to [birth year].  Changed this in the chart for patient and also called [facility] asking them to fax over a new form so it won't have the stamp "not a patient of [facility]" on it.  Will send to MD so she is aware that form will be in her box today. Jazmin Hartsell,CMA

## 2017-08-17 NOTE — Telephone Encounter (Signed)
Form completed, placed in fax pile

## 2017-09-08 ENCOUNTER — Ambulatory Visit (INDEPENDENT_AMBULATORY_CARE_PROVIDER_SITE_OTHER): Payer: Medicare Other | Admitting: Family Medicine

## 2017-09-08 ENCOUNTER — Encounter: Payer: Self-pay | Admitting: Family Medicine

## 2017-09-08 ENCOUNTER — Other Ambulatory Visit: Payer: Self-pay

## 2017-09-08 VITALS — BP 100/62 | HR 70 | Temp 97.6°F | Wt 205.0 lb

## 2017-09-08 DIAGNOSIS — J069 Acute upper respiratory infection, unspecified: Secondary | ICD-10-CM

## 2017-09-08 DIAGNOSIS — B9789 Other viral agents as the cause of diseases classified elsewhere: Secondary | ICD-10-CM | POA: Diagnosis not present

## 2017-09-08 MED ORDER — DEXTROMETHORPHAN HBR 15 MG/5ML PO SYRP: 10.0000 mL | ORAL_SOLUTION | Freq: Four times a day (QID) | ORAL | 0 refills | Status: DC | PRN

## 2017-09-08 MED ORDER — PREDNISONE 50 MG PO TABS: ORAL_TABLET | ORAL | 0 refills | Status: DC

## 2017-09-08 MED ORDER — PSEUDOEPHEDRINE-GUAIFENESIN ER 60-600 MG PO TB12: 1.0000 | ORAL_TABLET | Freq: Two times a day (BID) | ORAL | 2 refills | Status: DC

## 2017-09-08 NOTE — Patient Instructions (Signed)
It was great seeing you today! We have addressed the following issues today  1.  I prescribed mucinex, a cough syrup and prednisone (that you will take for 5 days, a pill a day). 2. This is a viral infection and will not require antibiotic. No sign of pneumonia. 3. Stay hydrated. Use mucinex and cough syrup as needed. It will take a few days for breathing status to return to normal.  If we did any lab work today, and the results require attention, either me or my nurse will get in touch with you. If everything is normal, you will get a letter in mail and a message via . If you don't hear from us in two weeks, please give us a call. Otherwise, we look forward to seeing you again at your next visit. If you have any questions or concerns before then, please call the clinic at [phone].  Please bring all your medications to every doctors visit  Sign up for My Chart to have easy access to your labs results, and communication with your Primary care physician. Please ask Front Desk for some assistance.   Please check-out at the front desk before leaving the clinic.    Take Care,   Dr. Sydnee Cabaliallo

## 2017-09-08 NOTE — Progress Notes (Signed)
   Subjective:    Patient ID: Alyssa Powell, female    DOB: 07/23/1951, 66 y.o.   MRN: [id number]   CC: Cough, congestion, chest tightness  HPI: Patient is a 66 year old old female who presents today complaining of cough, congestion and chest tightness for the past 5 days.  Patient has a history of asthma. Patient denies any sick contacts.  Patient has been using Robitussin for cough but no other medication.  Patient denies any fevers or chills.  Sister who is accompanying patient today reports mild wheezing.  Cough is unchanged with syrup.  Patient denies any chest pain, headache, dizziness but does endorse intermittent shortness of breath.  Patient has not used albuterol.  Smoking status reviewed   ROS: all other systems were reviewed and are negative other than in the HPI   Past Medical History:  Diagnosis Date  . Allergy   . Asthma   . Hypertension   . Mental retardation     History reviewed. No pertinent surgical history.  Past medical history, surgical, family, and social history reviewed and updated in the EMR as appropriate.  Objective:  BP 100/62   Pulse 70   Temp 97.6 F (36.4 C) (Oral)   Wt 205 lb (93 kg)   SpO2 95%   BMI 36.31 kg/m   Vitals and nursing note reviewed  General: NAD, pleasant, able to participate in exam Cardiac: RRR, normal heart sounds, no murmurs. 2+ radial and PT pulses bilaterally Respiratory: Bilateral diffused mild wheezing noted on exam, normal effort, No rales or rhonchi Abdomen: soft, nontender, nondistended, no hepatic or splenomegaly, +BS Extremities: no edema or cyanosis. WWP. Skin: warm and dry, no rashes noted Neuro: alert and oriented x4, no focal deficits Psych: Normal affect and mood   Assessment & Plan:   #Cough, congestion, chest tightness, stable Patient presents with symptoms consistent with viral URI.  On exam wheezing was noted bilaterally, however there is no increased work of breathing and vitals signs are within  normal limits with good oxygen saturation. Given history of asthma with multiple flare up in the past will treat conservatively. No signs of pneumonia or systemic infection.  No signs of acute asthma attack. --Will prescribe prednisone 50 mg daily for 5 days given diffuse wheezing --Will prescribe Mucinex to be taken as needed for congestion --Will prescribe dextromethorphan syrup to be taken as needed for cough --Recommended continue hydration --Use albuterol as needed, patient also has Advair --Patient will follow-up in clinic or in the ED if symptoms worsen in the next few days if current regimen.   Lovena NeighboursAbdoulaye Talajah Slimp, MD [facility] PGY-2

## 2017-09-11 ENCOUNTER — Other Ambulatory Visit: Payer: Self-pay | Admitting: Obstetrics and Gynecology

## 2017-09-16 ENCOUNTER — Other Ambulatory Visit: Payer: Self-pay | Admitting: Family Medicine

## 2017-10-25 ENCOUNTER — Other Ambulatory Visit: Payer: Self-pay

## 2017-10-25 MED ORDER — FLUTICASONE PROPIONATE 50 MCG/ACT NA SUSP
2.0000 | Freq: Every day | NASAL | 1 refills | Status: DC
Start: 2017-10-25 — End: 2018-01-02

## 2017-10-26 ENCOUNTER — Other Ambulatory Visit: Payer: Self-pay | Admitting: *Deleted

## 2017-10-26 MED ORDER — HYDROCHLOROTHIAZIDE 25 MG PO TABS: 25.0000 mg | ORAL_TABLET | Freq: Every day | ORAL | 3 refills | Status: DC

## 2017-10-27 ENCOUNTER — Ambulatory Visit: Payer: Medicare Other | Admitting: Family Medicine

## 2017-10-28 ENCOUNTER — Ambulatory Visit (INDEPENDENT_AMBULATORY_CARE_PROVIDER_SITE_OTHER): Payer: Medicare Other | Admitting: Family Medicine

## 2017-10-28 VITALS — BP 112/80 | HR 68 | Temp 97.8°F | Wt 202.8 lb

## 2017-10-28 DIAGNOSIS — J4521 Mild intermittent asthma with (acute) exacerbation: Secondary | ICD-10-CM | POA: Diagnosis present

## 2017-10-28 DIAGNOSIS — J45901 Unspecified asthma with (acute) exacerbation: Secondary | ICD-10-CM | POA: Insufficient documentation

## 2017-10-28 MED ORDER — FLUTICASONE-SALMETEROL 500-50 MCG/DOSE IN AEPB: INHALATION_SPRAY | RESPIRATORY_TRACT | 3 refills | Status: DC

## 2017-10-28 MED ORDER — ALBUTEROL SULFATE HFA 108 (90 BASE) MCG/ACT IN AERS: 2.0000 | INHALATION_SPRAY | RESPIRATORY_TRACT | 6 refills | Status: DC | PRN

## 2017-10-28 MED ORDER — ALBUTEROL SULFATE (2.5 MG/3ML) 0.083% IN NEBU
2.5000 mg | INHALATION_SOLUTION | Freq: Once | RESPIRATORY_TRACT | Status: AC
  Administered 2017-10-28: 2.5 mg via RESPIRATORY_TRACT

## 2017-10-28 MED ORDER — IPRATROPIUM BROMIDE 0.02 % IN SOLN
0.5000 mg | Freq: Once | RESPIRATORY_TRACT | Status: AC
  Administered 2017-10-28: 0.5 mg via RESPIRATORY_TRACT

## 2017-10-28 MED ORDER — SPACER/AERO CHAMBER MOUTHPIECE MISC: 0 refills | Status: DC

## 2017-10-28 MED ORDER — BENZONATATE 100 MG PO CAPS: 100.0000 mg | ORAL_CAPSULE | Freq: Two times a day (BID) | ORAL | 0 refills | Status: DC | PRN

## 2017-10-28 MED ORDER — PREDNISONE 50 MG PO TABS: 50.0000 mg | ORAL_TABLET | Freq: Every day | ORAL | 0 refills | Status: AC

## 2017-10-28 NOTE — Assessment & Plan Note (Addendum)
Acute on chronic. Mild in severity without hypoxia. Source likely viral URI by history and sick contacts. Has multiple asthma meds though education on proper use of meds is limited with patient and family. Has asthma action plan and mother has albuterol nebs at home. - Given DuoNeb treatment with improvement of wheeze prior to discharge - Prednisone 50 mg burst for 5 days - Refilled albuterol inhaler, spacer, Advair Diskus and reviewed how to use medications properly with teach-back method - Tessalon 100 mg twice daily as needed and honey as needed for cough - Reviewed return precations

## 2017-10-28 NOTE — Progress Notes (Signed)
Subjective   Patient ID: Alyssa Powell    DOB: 03-03-1951, 67 y.o. female   MRN: [id number]  CC: "Cough"  HPI: Alyssa Powell is a 67 y.o. female who presents for a same day appointment for the following:  URI  Has been sick for 7 days. Nasal discharge: Yes, clear Medications tried: Robitussin without improvement Sick contacts: Son has recently been ill with similar symptoms  Symptoms Fever: Subjective Headache or face pain: No Tooth pain: No Sneezing: Yes Scratchy throat: No Allergies: Yes Muscle aches: No Severe fatigue: No Stiff neck: No Shortness of breath: Yes Rash: No Sore throat or swollen glands: No  In addition: No nausea/vomitig/diarrhea.  Productive cough with clear to white phlegm. Eyes have been itching and watery.  She is a non-smoker.  Patient has been using her mother's albuterol nebulizer every 4 hours with some improvement though coughing persists.  Patient has seldom use of her albuterol inhaler at baseline.  She is prescribed Advair Diskus but does not take it.  ROS: see HPI for pertinent.  PMFSH: Asthma, allergies, HTN, obesity, OA.  Surgical and family history unremarkable.  Smoking status reviewed. Medications reviewed.  Objective   BP 112/80 (BP Location: Left Arm, Patient Position: Sitting, Cuff Size: Large)   Pulse 68   Temp 97.8 F (36.6 C) (Oral)   Wt 202 lb 12.8 oz (92 kg)   SpO2 96%   BMI 35.92 kg/m  Vitals and nursing note reviewed.  General: well nourished, well developed, NAD with non-toxic appearance HEENT: normocephalic, atraumatic, moist mucous membranes, cough present, clear rhinorrhea Neck: supple, non-tender without lymphadenopathy Cardiovascular: regular rate and rhythm without murmurs, rubs, or gallops Lungs: expiratory wheeze bilaterally worse on left without rales or rhonchi with normal work of breathing on room air Abdomen: soft, non-tender, non-distended, normoactive bowel sounds Skin: warm, dry, no rashes or lesions,  cap refill < 2 seconds Extremities: warm and well perfused, normal tone, no edema  Assessment & Plan   Asthma with acute exacerbation Acute on chronic. Mild in severity without hypoxia. Source likely viral URI by history and sick contacts. Has multiple asthma meds though education on proper use of meds is limited with patient and family. Has asthma action plan and mother has albuterol nebs at home. - Given DuoNeb treatment with improvement of wheeze prior to discharge - Prednisone 50 mg burst for 5 days - Refilled albuterol inhaler, spacer, Advair Diskus and reviewed how to use medications properly with teach-back method - Tessalon 100 mg twice daily as needed and honey as needed for cough - Reviewed return precations  No orders of the defined types were placed in this encounter.  Meds ordered this encounter  Medications  . Fluticasone-Salmeterol (ADVAIR DISKUS) 500-50 MCG/DOSE AEPB    Sig: INHALE 1 PUFF INTO LUNGS TWICE A DAY    Dispense:  60 each    Refill:  3  . albuterol (PROVENTIL HFA;VENTOLIN HFA) 108 (90 Base) MCG/ACT inhaler    Sig: Inhale 2 puffs into the lungs every 4 (four) hours as needed for wheezing or shortness of breath.    Dispense:  2 Inhaler    Refill:  6  . Spacer/Aero Chamber Mouthpiece MISC    Sig: Use spacer with inhaler as directed.    Dispense:  2 each    Refill:  0  . benzonatate (TESSALON) 100 MG capsule    Sig: Take 1 capsule (100 mg total) by mouth 2 (two) times daily as needed for cough.  Dispense:  20 capsule    Refill:  0  . predniSONE (DELTASONE) 50 MG tablet    Sig: Take 1 tablet (50 mg total) by mouth daily with breakfast for 5 days.    Dispense:  5 tablet    Refill:  0  . albuterol (PROVENTIL) (2.5 MG/3ML) 0.083% nebulizer solution 2.5 mg  . ipratropium (ATROVENT) nebulizer solution 0.5 mg    Durward Parcelavid McMullen, DO [facility], PGY-2 10/31/2017, 2:21 PM

## 2017-10-28 NOTE — Patient Instructions (Signed)
Thank you for coming in to see us today. Please see below to review our plan for today's visit.  1.  You appear to be having an asthma exacerbation.  This is likely due to a viral upper respiratory tract infection.  You do not have signs of pneumonia and would not benefit from antibiotics at this time.  We gave you an asthma treatment in the office.  I have sent in refills of all of your asthma prescriptions including the albuterol inhaler with a spacer that you will use 2 puffs every 4 hours only as needed with wheezing or shortness of breath.  You can continue using the albuterol nebulizer every 4 hours as needed until your illness resolves.  We gave you Advair Diskus which is your controller medication which you will take 2 puffs daily regardless of your symptoms.   2.  For your exacerbation, take 50 mg tablet of prednisone daily for 5 days.  I have also given you Tessalon Perles which you can take twice daily for cough.  You can supplement your cough with a tablespoon of honey 3 times daily. 3.  Follow-up in the clinic in 1 week.  Please call the clinic at [phone] if your symptoms worsen or you have any concerns. It was our pleasure to serve you.  Durward Parcelavid Aasiya Creasey, DO [facility], PGY-2

## 2017-11-07 ENCOUNTER — Other Ambulatory Visit: Payer: Self-pay | Admitting: Family Medicine

## 2017-11-14 ENCOUNTER — Ambulatory Visit: Payer: Medicare Other | Admitting: Podiatry

## 2017-11-21 ENCOUNTER — Other Ambulatory Visit: Payer: Self-pay | Admitting: Family Medicine

## 2017-11-22 ENCOUNTER — Other Ambulatory Visit: Payer: Self-pay

## 2017-11-22 MED ORDER — ALBUTEROL SULFATE HFA 108 (90 BASE) MCG/ACT IN AERS: 2.0000 | INHALATION_SPRAY | RESPIRATORY_TRACT | 6 refills | Status: DC | PRN

## 2017-12-13 ENCOUNTER — Other Ambulatory Visit: Payer: Self-pay | Admitting: Family Medicine

## 2017-12-13 MED ORDER — FLUTICASONE FUROATE-VILANTEROL 200-25 MCG/INH IN AEPB: 1.0000 | INHALATION_SPRAY | Freq: Every day | RESPIRATORY_TRACT | 0 refills | Status: DC

## 2017-12-13 NOTE — Progress Notes (Signed)
  Received notification from insurance company that Advair is not covered by [employer]. They provided patient with Wixela inhaler in the interim. They did not provide information about the medication they do cover in their notice. Patient was instructed to call them for coverage. Will send in Rx for Breo which appears to be covered according to [employer]'s formulary.   Dolores PattyAngela Stephanie Mcglone, DO PGY-2, [facility] 12/13/2017 2:08 PM

## 2018-01-02 ENCOUNTER — Other Ambulatory Visit: Payer: Self-pay | Admitting: Family Medicine

## 2018-01-15 ENCOUNTER — Other Ambulatory Visit: Payer: Self-pay | Admitting: Family Medicine

## 2018-01-17 ENCOUNTER — Other Ambulatory Visit: Payer: Self-pay

## 2018-01-18 MED ORDER — TRAZODONE HCL 150 MG PO TABS: ORAL_TABLET | ORAL | 1 refills | Status: DC

## 2018-02-16 ENCOUNTER — Other Ambulatory Visit: Payer: Self-pay | Admitting: Family Medicine

## 2018-02-22 ENCOUNTER — Other Ambulatory Visit: Payer: Self-pay | Admitting: Family Medicine

## 2018-02-25 ENCOUNTER — Other Ambulatory Visit: Payer: Self-pay | Admitting: Family Medicine

## 2018-03-17 ENCOUNTER — Ambulatory Visit: Payer: Medicare Other | Admitting: Podiatry

## 2018-04-02 ENCOUNTER — Other Ambulatory Visit: Payer: Self-pay | Admitting: Family Medicine

## 2018-04-03 ENCOUNTER — Other Ambulatory Visit: Payer: Self-pay | Admitting: Family Medicine

## 2018-05-07 ENCOUNTER — Other Ambulatory Visit: Payer: Self-pay | Admitting: Family Medicine

## 2018-08-01 IMAGING — CR DG CHEST 2V
2 series · 2 of 2 positions shown · non-contrast
Comparison: Of [DATE]

CLINICAL DATA: Cough and shortness of breath for 2 weeks, some
improvement with course of steroids, expiratory wheezing, history
asthma and hypertension

EXAM:
CHEST  2 VIEW

[w chest pa]
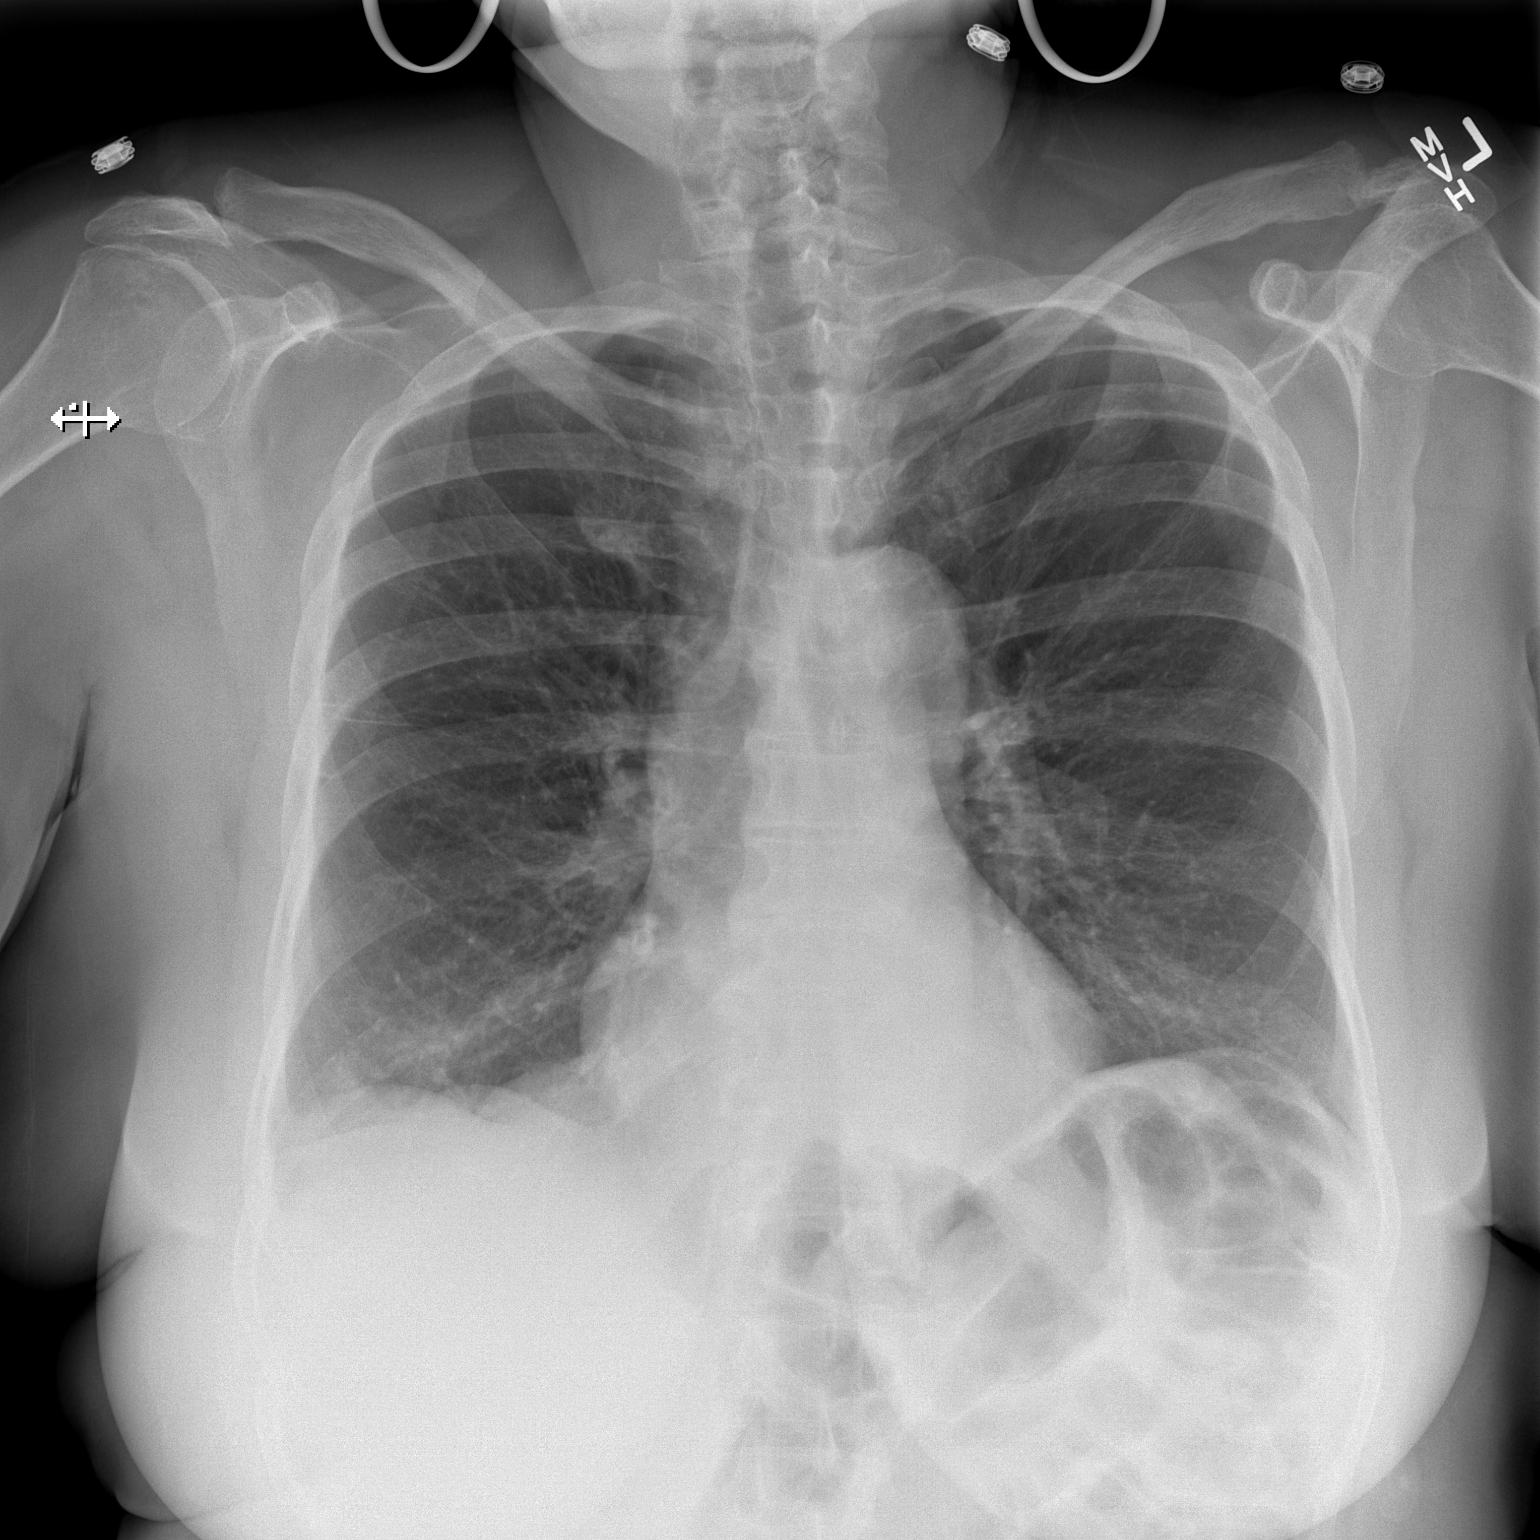

[w chest lat]
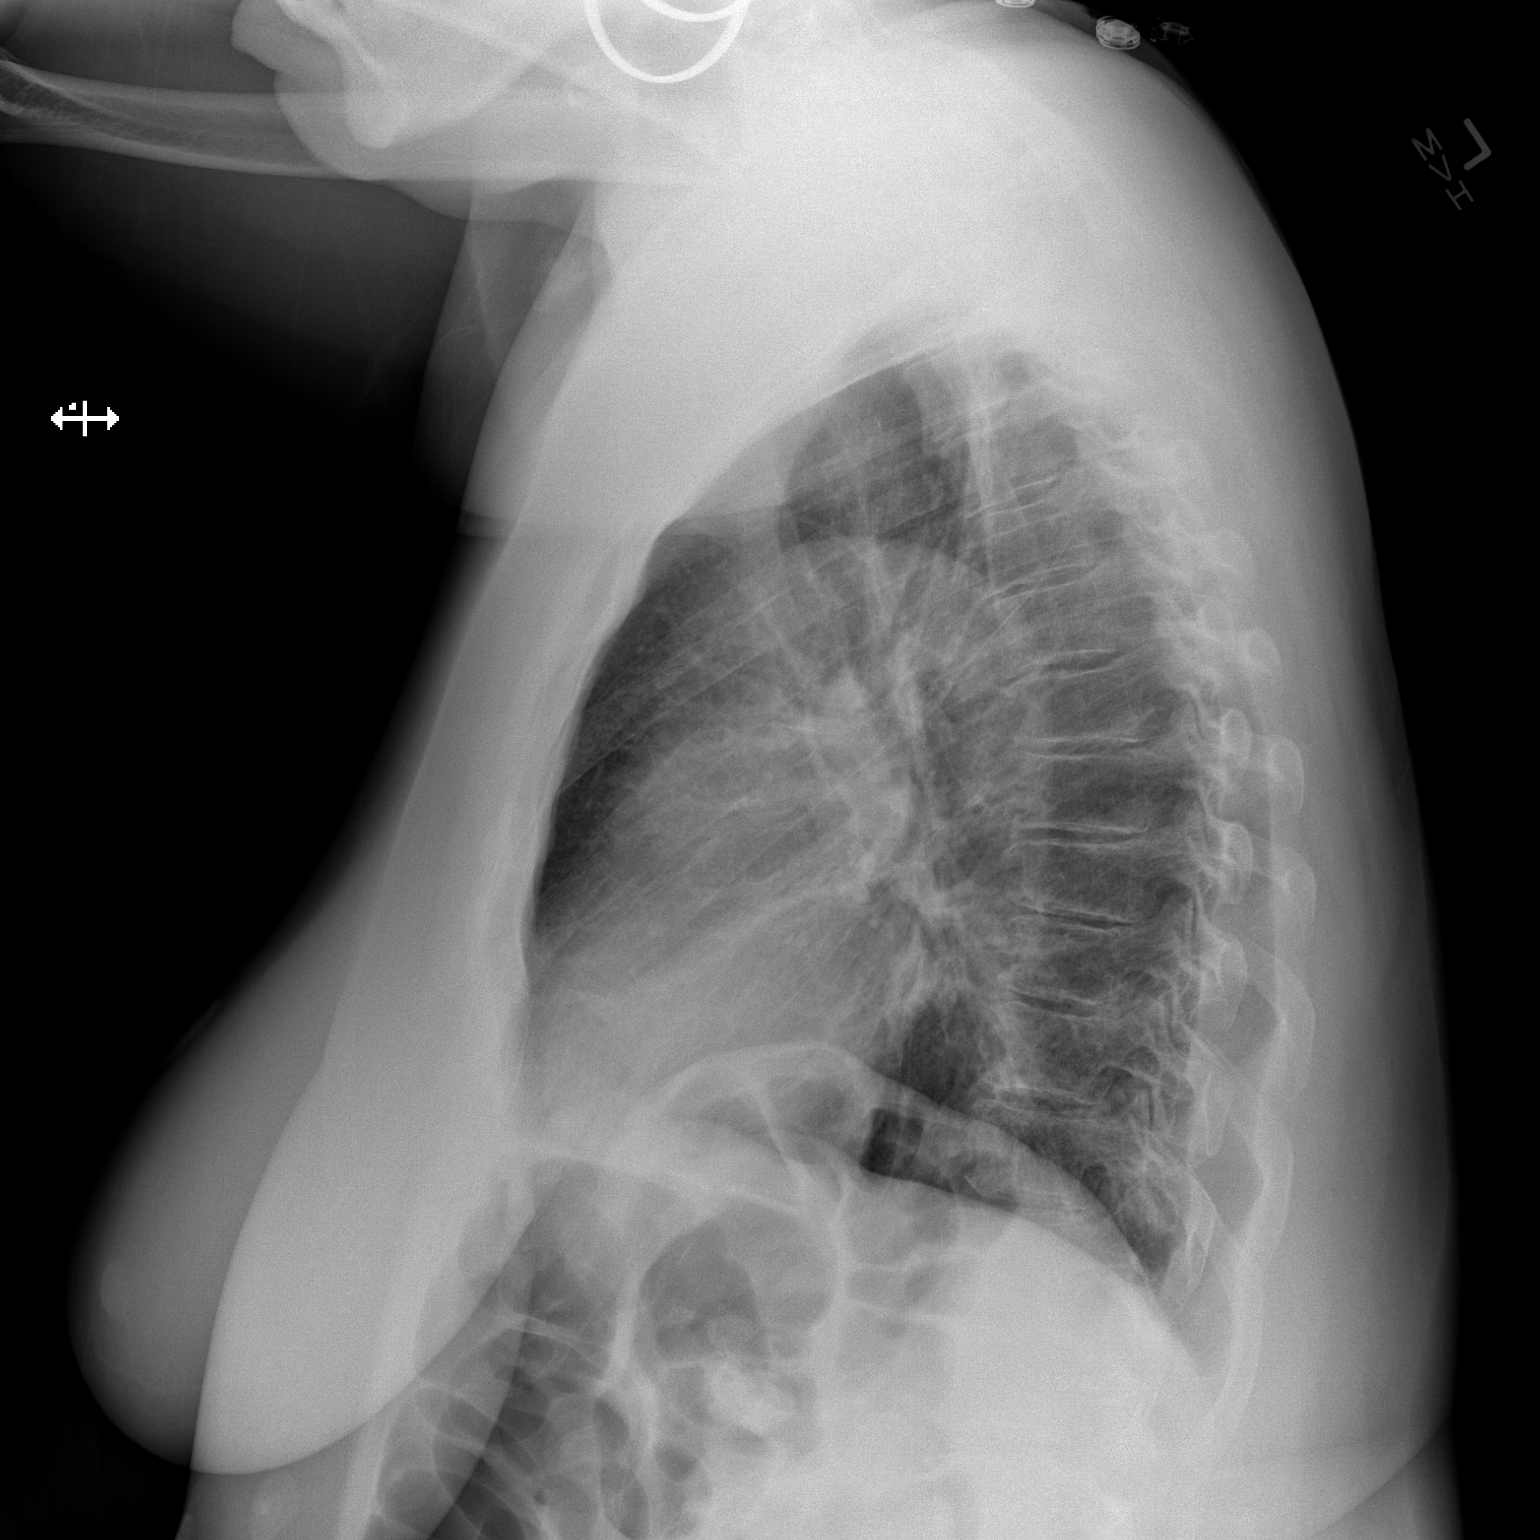

[2 of 2 positions shown; findings below may reference images not displayed]

FINDINGS: Normal heart size, mediastinal contours, and pulmonary vascularity.

Lungs grossly clear.

No pulmonary infiltrate, pleural effusion or pneumothorax.

Bones demineralized.
IMPRESSION: No acute abnormalities.

## 2018-08-14 ENCOUNTER — Telehealth: Payer: Self-pay | Admitting: Family Medicine

## 2018-08-14 NOTE — Telephone Encounter (Signed)
Dr. Charissa BashJulie Williams from [facility] called to see to inform us that this pt established care with her in July of 2019. Pt is now wanting to leave [facility] and establish care again with Dr. Cherie Darkicco. I informed Dr. Mayford KnifeWilliams that once a pt has established care with another PCP they are no longer pt's here at [facility] and she would have to start over with the new patient process. I also told her we are not accepting new patients at the moment so we would not be able to see pt.

## 2018-08-31 ENCOUNTER — Encounter: Payer: Self-pay | Admitting: Family Medicine

## 2018-08-31 ENCOUNTER — Ambulatory Visit (INDEPENDENT_AMBULATORY_CARE_PROVIDER_SITE_OTHER): Payer: Medicare Other | Admitting: Family Medicine

## 2018-08-31 ENCOUNTER — Other Ambulatory Visit: Payer: Self-pay | Admitting: Family Medicine

## 2018-08-31 VITALS — BP 147/78 | HR 59 | Resp 17 | Ht 68.0 in | Wt 211.4 lb

## 2018-08-31 DIAGNOSIS — I10 Essential (primary) hypertension: Secondary | ICD-10-CM

## 2018-08-31 DIAGNOSIS — F329 Major depressive disorder, single episode, unspecified: Secondary | ICD-10-CM | POA: Diagnosis not present

## 2018-08-31 DIAGNOSIS — Z7689 Persons encountering health services in other specified circumstances: Secondary | ICD-10-CM

## 2018-08-31 DIAGNOSIS — J4540 Moderate persistent asthma, uncomplicated: Secondary | ICD-10-CM | POA: Diagnosis not present

## 2018-08-31 MED ORDER — TRAZODONE HCL 150 MG PO TABS: 150.0000 mg | ORAL_TABLET | Freq: Every day | ORAL | 3 refills | Status: DC

## 2018-08-31 MED ORDER — FLUTICASONE PROPIONATE 50 MCG/ACT NA SUSP: 2.0000 | Freq: Every day | NASAL | 6 refills | Status: DC

## 2018-08-31 MED ORDER — SERTRALINE HCL 50 MG PO TABS: 50.0000 mg | ORAL_TABLET | Freq: Every day | ORAL | 3 refills | Status: DC

## 2018-08-31 MED ORDER — HYDROCHLOROTHIAZIDE 25 MG PO TABS: 25.0000 mg | ORAL_TABLET | Freq: Every day | ORAL | 3 refills | Status: DC

## 2018-08-31 MED ORDER — OMEPRAZOLE 20 MG PO CPDR: 20.0000 mg | DELAYED_RELEASE_CAPSULE | Freq: Every day | ORAL | 6 refills | Status: DC

## 2018-08-31 MED ORDER — FLUTICASONE FUROATE-VILANTEROL 200-25 MCG/INH IN AEPB: 1.0000 | INHALATION_SPRAY | Freq: Every day | RESPIRATORY_TRACT | 2 refills | Status: DC

## 2018-08-31 NOTE — Patient Instructions (Addendum)
Thank you for choosing Primary Care at [facility]!    Alyssa Powell was seen by Alyssa CourtsKimberly Harris, FNP today.   Alyssa Powell'Powell primary care provider is Alyssa Powell, Alyssa S, FNP.   For the best care possible, you should try to see Alyssa CourtsKimberly Harris, FNP-C whenever you come to the clinic.   We look forward to seeing you again soon!  If you have any questions about your visit today, please call us at [phone] or feel free to reach your primary care provider via MyChart.

## 2018-08-31 NOTE — Progress Notes (Signed)
Peterson AoWanda Hoeffner, is a 67 y.o. female  ZOX:[id number]  [id number]  DOB - 02/12/1951  CC:  Chief Complaint  Patient presents with  . Establish Care       HPI: Burna MortimerWanda is a 67 y.o. female is here today to establish care.   Peterson AoWanda Winders medical history includes depression, acid reflux, asthma, and fluid retention.     Today's visit:  Presents accompanied by sister. She is here to establish. Visit is limited due to no recent medical records and family accompanying patient is not very knowledgable regarding patient's past medical history. She [facility] [place] which a [place] care facility that provides respite day care. She will soon run out of her chronic medication and is in need for medication refills. Denies new headaches, chest pain, abdominal pain, nausea, new weakness , numbness or tingling, SOB, edema, or worrisome cough.   Current medications:No current outpatient medications on file.   Pertinent family medical history: family history is not on file.   Allergies  Allergen Reactions  . Penicillins     Childhood allergy    Social History   Socioeconomic History  . Marital status: Single    Spouse name: Not on file  . Number of children: Not on file  . Years of education: Not on file  . Highest education level: Not on file  Occupational History  . Not on file  Social Needs  . Financial resource strain: Not on file  . Food insecurity:    Worry: Not on file    Inability: Not on file  . Transportation needs:    Medical: Not on file    Non-medical: Not on file  Tobacco Use  . Smoking status: Never Smoker  . Smokeless tobacco: Never Used  Substance and Sexual Activity  . Alcohol use: Not Currently  . Drug use: Never  . Sexual activity: Not on file  Lifestyle  . Physical activity:    Days per week: Not on file    Minutes per session: Not on file  . Stress: Not on file  Relationships  . Social connections:    Talks on phone: Not on  file    Gets together: Not on file    Attends religious service: Not on file    Active member of club or organization: Not on file    Attends meetings of clubs or organizations: Not on file    Relationship status: Not on file  . Intimate partner violence:    Fear of current or ex partner: Not on file    Emotionally abused: Not on file    Physically abused: Not on file    Forced sexual activity: Not on file  Other Topics Concern  . Not on file  Social History Narrative  . Not on file    Review of Systems: Constitutional: Negative for fever, chills, diaphoresis, activity change, appetite change and fatigue. HENT: Negative for ear pain, nosebleeds, congestion, facial swelling, rhinorrhea, neck pain, neck stiffness and ear discharge.  Eyes: Negative for pain, discharge, redness, itching and visual disturbance. Respiratory: Negative for cough, choking, chest tightness, shortness of breath, wheezing and stridor.  Cardiovascular: Negative for chest pain, palpitations and leg swelling. Gastrointestinal: Negative for abdominal distention. Genitourinary: Negative for dysuria, urgency, frequency, hematuria, flank pain, decreased urine volume, difficulty urinating. Musculoskeletal: Negative for back pain, joint swelling, arthralgia and gait problem. Neurological: Negative for dizziness, tremors, seizures, syncope, facial asymmetry, speech difficulty, weakness, light-headedness, numbness and headaches.  Hematological:  Negative for adenopathy. Does not bruise/bleed easily. Psychiatric/Behavioral: Negative for hallucinations, behavioral problems, confusion, dysphoric mood, decreased concentration and agitation.    Objective:   Vitals:   08/31/18 1532  BP: (!) 147/78  Pulse: (!) 59  Resp: 17  SpO2: 97%    BP Readings from Last 3 Encounters:  08/31/18 (!) 147/78    Filed Weights   08/31/18 1532  Weight: 211 lb 6.4 oz (95.9 kg)      Physical Exam: Constitutional: Patient appears  well-developed and well-nourished. No distress. HENT: Normocephalic, atraumatic, External right and left ear normal. Oropharynx is clear and moist.  Eyes: Conjunctivae and EOM are normal. PERRLA, no scleral icterus. Neck: Normal ROM. Neck supple. No JVD. No tracheal deviation. No thyromegaly. CVS: RRR, S1/S2 +, no murmurs, no gallops, no carotid bruit.  Pulmonary: Effort and breath sounds normal, no stridor, rhonchi, wheezes, rales.  Abdominal: Soft. BS +, no distension, tenderness, rebound or guarding.  Musculoskeletal: Normal range of motion. No edema and no tenderness.  Neuro: Alert. Normal muscle tone coordination. Normal gait. BUE and BLE strength 5/5. Bilateral hand grips symmetrical. Skin: Skin is warm and dry. No rash noted. Not diaphoretic. No erythema. No pallor. Psychiatric: Normal mood and affect for patients baseline (per family). Behavior appropriate , cognitive deficit noted,      Assessment and plan:  1. Encounter to establish care  2. Moderate persistent asthma, unspecified whether complicated -uncertain of length of time condition has been present. No medical records or reliable historian present at visit today. Will continue current treatment. Patient is asymptomatic today.  3. Essential hypertension, stable, elevated.  No changes in medication today. Will continue to monitor. We have discussed target BP range and blood pressure goal. I have advised patient to check BP regularly and to call us back or report to clinic if the numbers are consistently higher than 140/90. We discussed the importance of compliance with medical therapy and DASH diet recommended, consequences of uncontrolled hypertension discussed.   4. Major depressive disorder with single episode, remission status unspecified -Uncertain of length of time condition has been present. No medical records or reliable historian present at visit today. No acute signs or symptoms related to psychosis or uncontrolled  depression seen during visit today. Patient is pleasant and emotionally appropriate throughout visit today.   Return in about 6 weeks (around 10/12/2018) for medicare wellness 6 week w/ fasting lab.   The patient was given clear instructions to go to ER or return to medical center if symptoms don't improve, worsen or new problems develop. The patient verbalized understanding. The patient was advised  to call and obtain lab results if they haven't heard anything from out office within 7-10 business days.  Joaquin Courts, FNP Primary Care at [facility] [address], [place] [address] [phone]: [phone]    This note has been created with Dragon speech recognition software and Paediatric nurse. Any transcriptional errors are unintentional.

## 2018-09-05 DIAGNOSIS — I10 Essential (primary) hypertension: Secondary | ICD-10-CM | POA: Insufficient documentation

## 2018-09-05 DIAGNOSIS — J4540 Moderate persistent asthma, uncomplicated: Secondary | ICD-10-CM | POA: Insufficient documentation

## 2018-09-05 DIAGNOSIS — F329 Major depressive disorder, single episode, unspecified: Secondary | ICD-10-CM | POA: Insufficient documentation

## 2018-09-26 ENCOUNTER — Telehealth: Payer: Self-pay | Admitting: Family Medicine

## 2018-09-26 DIAGNOSIS — Z7409 Other reduced mobility: Secondary | ICD-10-CM

## 2018-09-26 DIAGNOSIS — M623 Immobility syndrome (paraplegic): Secondary | ICD-10-CM

## 2018-09-26 NOTE — Telephone Encounter (Signed)
Patient called requesting a chair for patient for when she is showering. Please follow up.

## 2018-09-26 NOTE — Telephone Encounter (Signed)
Please advise 

## 2018-09-27 ENCOUNTER — Telehealth: Payer: Self-pay | Admitting: Family Medicine

## 2018-09-27 DIAGNOSIS — M623 Immobility syndrome (paraplegic): Secondary | ICD-10-CM

## 2018-09-27 DIAGNOSIS — R41840 Attention and concentration deficit: Secondary | ICD-10-CM

## 2018-09-27 DIAGNOSIS — R2681 Unsteadiness on feet: Secondary | ICD-10-CM

## 2018-09-27 DIAGNOSIS — Z7409 Other reduced mobility: Secondary | ICD-10-CM

## 2018-09-27 NOTE — Telephone Encounter (Signed)
Please advise 

## 2018-09-27 NOTE — Telephone Encounter (Signed)
Patient mother called requesting a chair commode, she states that the patient sits in solution when taking a bath.

## 2018-09-27 NOTE — Telephone Encounter (Signed)
Fax signed order to [facility]. Notify patient's family order faxed and they can follow-up with [facility] regarding order and pick-up/delivery of supply

## 2018-09-27 NOTE — Telephone Encounter (Signed)
Contact caller to obtain clarity. I'm uncertain as to what she is requesting. If there is not a medical justification and diagnostic code for request, medicare will not cover the equipment.

## 2018-09-27 NOTE — Telephone Encounter (Signed)
Order faxed to [phone]).  Family member called & notified.

## 2018-09-27 NOTE — Telephone Encounter (Signed)
Left voice mail to call back 

## 2018-09-29 NOTE — Telephone Encounter (Signed)
Spoke to patient's mom. Patient already has a chair for when she showers.  Ms Alyssa Powell is requesting a bedside commode & a wash basin attachment to be used for patient's sitz baths. Would like this faxed to [facility].

## 2018-09-29 NOTE — Addendum Note (Signed)
Addended by: Bing Neighbors on: 09/29/2018 12:04 PM   Modules accepted: Orders

## 2018-09-29 NOTE — Telephone Encounter (Signed)
Order faxed to [facility] [phone])

## 2018-09-29 NOTE — Telephone Encounter (Signed)
Please fax order to [facility]. Ordered completed.

## 2018-10-03 ENCOUNTER — Telehealth: Payer: Self-pay | Admitting: Family Medicine

## 2018-10-03 NOTE — Telephone Encounter (Signed)
Patients mother called requesting a call back from nurse, stated it was "so very important" please follow up.

## 2018-10-12 ENCOUNTER — Encounter: Payer: Self-pay | Admitting: Family Medicine

## 2018-10-12 ENCOUNTER — Ambulatory Visit (INDEPENDENT_AMBULATORY_CARE_PROVIDER_SITE_OTHER): Payer: Medicare Other | Admitting: Family Medicine

## 2018-10-12 ENCOUNTER — Other Ambulatory Visit: Payer: Self-pay

## 2018-10-12 ENCOUNTER — Other Ambulatory Visit: Payer: Self-pay | Admitting: Family Medicine

## 2018-10-12 VITALS — BP 130/84 | HR 66 | Resp 17 | Ht 68.0 in | Wt 211.0 lb

## 2018-10-12 DIAGNOSIS — Z13220 Encounter for screening for lipoid disorders: Secondary | ICD-10-CM

## 2018-10-12 DIAGNOSIS — I10 Essential (primary) hypertension: Secondary | ICD-10-CM | POA: Diagnosis not present

## 2018-10-12 DIAGNOSIS — Z1329 Encounter for screening for other suspected endocrine disorder: Secondary | ICD-10-CM

## 2018-10-12 DIAGNOSIS — Z1159 Encounter for screening for other viral diseases: Secondary | ICD-10-CM | POA: Diagnosis not present

## 2018-10-12 DIAGNOSIS — R7309 Other abnormal glucose: Secondary | ICD-10-CM | POA: Diagnosis not present

## 2018-10-12 DIAGNOSIS — Z0000 Encounter for general adult medical examination without abnormal findings: Secondary | ICD-10-CM | POA: Diagnosis not present

## 2018-10-12 DIAGNOSIS — Z1231 Encounter for screening mammogram for malignant neoplasm of breast: Secondary | ICD-10-CM

## 2018-10-12 MED ORDER — ALBUTEROL SULFATE HFA 108 (90 BASE) MCG/ACT IN AERS: 1.0000 | INHALATION_SPRAY | Freq: Four times a day (QID) | RESPIRATORY_TRACT | 2 refills | Status: DC | PRN

## 2018-10-12 MED ORDER — FLUTICASONE FUROATE-VILANTEROL 200-25 MCG/INH IN AEPB: 1.0000 | INHALATION_SPRAY | Freq: Every day | RESPIRATORY_TRACT | 2 refills | Status: DC

## 2018-10-12 MED ORDER — PANTOPRAZOLE SODIUM 40 MG PO TBEC: 40.0000 mg | DELAYED_RELEASE_TABLET | Freq: Every day | ORAL | 3 refills | Status: DC

## 2018-10-12 MED ORDER — FLUTICASONE PROPIONATE 50 MCG/ACT NA SUSP: 2.0000 | Freq: Every day | NASAL | 6 refills | Status: DC

## 2018-10-12 NOTE — Patient Instructions (Addendum)
Alyssa Powell , Thank you for taking time to come for your Medicare Wellness Visit. I appreciate your ongoing commitment to your health goals. Please review the following plan we discussed and let me know if I can assist you in the future.   These are the goals we discussed: Goals  Increasing physical activity recommended 150 minutes per week Improving food choices to include more vegetables and fruits.  Increase water intake and decrease sodas.        This is a list of the screening recommended for you and due dates:  Health Maintenance  Topic Date Due  .  Hepatitis C: One time screening is recommended by Center for Disease Control  (CDC) for  adults born from [birth year] through 1967.   March 11, 1951  . Tetanus Vaccine  07/31/1968  . Mammogram  08/01/1999  . Colon Cancer Screening  08/01/1999  . DEXA scan (bone density measurement)  07/31/2014  . Pneumonia vaccines (1 of 2 - PCV13) 07/31/2014  . Flu Shot  04/20/2018   Mammogram was ordered. Influenza is current. Will review outstanding immunization records upon receipt of medical records from prior provider.    Contact [facility] at [phone] to provide your verbal authorization to have bedside commode delivered .

## 2018-10-12 NOTE — Progress Notes (Signed)
Subjective:    Alyssa Powell is a 68 y.o. female who presents for Medicare Annual/Subsequent preventive examination.  Preventive Screening-Counseling & Management  Tobacco Social History   Tobacco Use  Smoking Status Never Smoker  Smokeless Tobacco Never Used     Problems Prior to Visit 1. Current Body mass index is 32.08 kg/m.   Current Problems (verified) Patient Active Problem List   Diagnosis Date Noted  . Moderate persistent asthma 09/05/2018  . Essential hypertension 09/05/2018  . Major depressive disorder with single episode 09/05/2018    Medications Prior to Visit Current Outpatient Medications on File Prior to Visit  Medication Sig Dispense Refill  . fluticasone (FLONASE) 50 MCG/ACT nasal spray Place 2 sprays into both nostrils daily. 16 g 6  . fluticasone furoate-vilanterol (BREO ELLIPTA) 200-25 MCG/INH AEPB Inhale 1 puff into the lungs daily. 60 each 2  . hydrochlorothiazide (HYDRODIURIL) 25 MG tablet Take 1 tablet (25 mg total) by mouth daily. 90 tablet 3  . omeprazole (PRILOSEC) 20 MG capsule Take 1 capsule (20 mg total) by mouth daily. 30 capsule 6  . sertraline (ZOLOFT) 50 MG tablet Take 1 tablet (50 mg total) by mouth daily. 30 tablet 3  . traZODone (DESYREL) 150 MG tablet Take 1 tablet (150 mg total) by mouth at bedtime. 30 tablet 3   No current facility-administered medications on file prior to visit.     Current Medications (verified) Current Outpatient Medications  Medication Sig Dispense Refill  . fluticasone (FLONASE) 50 MCG/ACT nasal spray Place 2 sprays into both nostrils daily. 16 g 6  . fluticasone furoate-vilanterol (BREO ELLIPTA) 200-25 MCG/INH AEPB Inhale 1 puff into the lungs daily. 60 each 2  . hydrochlorothiazide (HYDRODIURIL) 25 MG tablet Take 1 tablet (25 mg total) by mouth daily. 90 tablet 3  . omeprazole (PRILOSEC) 20 MG capsule Take 1 capsule (20 mg total) by mouth daily. 30 capsule 6  . sertraline (ZOLOFT) 50 MG tablet Take 1 tablet  (50 mg total) by mouth daily. 30 tablet 3  . traZODone (DESYREL) 150 MG tablet Take 1 tablet (150 mg total) by mouth at bedtime. 30 tablet 3   No current facility-administered medications for this visit.      Allergies (verified) Penicillins   PAST HISTORY  Family History No family history on file.  Social History Social History   Tobacco Use  . Smoking status: Never Smoker  . Smokeless tobacco: Never Used  Substance Use Topics  . Alcohol use: Not Currently     Are there smokers in your home (other than you)? Yes, nephew not inside   Risk Factors Current exercise habits: The patient does not participate in regular exercise at present.  Dietary issues discussed: none    Cardiac risk factors: hypertension, obesity (BMI >= 30 kg/m2) and sedentary lifestyle.  Depression Screen (Note: if answer to either of the following is "Yes", a more complete depression screening is indicated)   Over the past two weeks, have you felt down, depressed or hopeless? No  Over the past two weeks, have you felt little interest or pleasure in doing things? No  Have you lost interest or pleasure in daily life? No  Do you often feel hopeless? No  Do you cry easily over simple problems? No  Activities of Daily Living In your present state of health, do you have any difficulty performing the following activities?:  Driving? No Managing money?  Sister  Feeding yourself? Yes Getting from bed to chair? No Climbing a flight of  stairs? No Preparing food and eating?: Yes Bathing or showering? Yes Getting dressed: Yes Getting to the toilet? Yes Using the toilet:Yes Moving around from place to place: No In the past year have you fallen or had a near fall?:No   Are you sexually active?  No  Do you have more than one partner?  n/a  Hearing Difficulties: Yes Do you often ask people to speak up or repeat themselves? Yes Do you experience ringing or noises in your ears? No Do you have difficulty  understanding soft or whispered voices? Yes   Do you feel that you have a problem with memory? Yes  Do you often misplace items? Yes  Do you feel safe at home?  Yes  Cognitive Testing  Alert? Yes  Normal Appearance?Yes  Oriented to person? Yes  Place? Yes   Time? No  Recall of three objects?  No  Can perform simple calculations? No  Displays appropriate judgment?No  Can read the correct time from a watch face?Yes   Advanced Directives have been discussed with the patient? Yes  List the Names of Other Physician/Practitioners you currently use: 1.  None   Indicate any recent Medical Services you may have received from other than [facility] providers in the past year (date may be approximate).  Patient's medical records are pending from prior provider. Family believes  Screening Tests Health Maintenance  Topic Date Due  . Hepatitis C Screening  [birth year]  . TETANUS/TDAP  07/31/1968  . MAMMOGRAM  08/01/1999  . COLONOSCOPY  08/01/1999  . DEXA SCAN  07/31/2014  . PNA vac Low Risk Adult (1 of 2 - PCV13) 07/31/2014  . INFLUENZA VACCINE  04/20/2018     All answers were reviewed with the patient and necessary referrals were made:  Joaquin CourtsKimberly Jamarius Saha, FNP   10/12/2018   History reviewed: allergies, current medications, past family history, past medical history, past social history, past surgical history and problem list  Review of Systems Pertinent items are noted in HPI.    Objective:       Visual Acuity Screening   Right eye Left eye Both eyes  Without correction: 20/40 20/50 20/40   With correction:       Body mass index is 32.08 kg/m. BP 130/84   Pulse 66   Resp 17   Ht 5\' 8"  (1.727 m)   Wt 211 lb (95.7 kg)   SpO2 97%   BMI 32.08 kg/m   Physical Exam: Constitutional: Patient appears well-developed and well-nourished. No distress. HENT: Normocephalic, atraumatic, External right and left ear normal. Oropharynx is clear and moist.  Eyes: Conjunctivae and EOM are  normal. PERRLA, no scleral icterus. Neck: Normal ROM. Neck supple. No JVD. No tracheal deviation. No thyromegaly. CVS: RRR, S1/S2 +, no murmurs, no gallops, no carotid bruit.  Pulmonary: Effort and breath sounds normal, no stridor, rhonchi, wheezes, rales.  Abdominal: Soft. BS +, no distension, tenderness, rebound or guarding.  Musculoskeletal: Normal range of motion. No edema and no tenderness.  Neuro: Alert. Normal reflexes, muscle tone coordination.  Skin: Skin is warm and dry. No rash noted. Not diaphoretic. No erythema. No pallor. Psychiatric: Normal mood and affect. Behavior, judgment, thought content normal.  Assessment and Plan:  1. Medicare annual wellness visit, subsequent Age appropriate anticipatory guidance provided, family present at visit today.  2. Essential hypertension, controlled  - Comprehensive metabolic panel - CBC with Differential  3. Screening for thyroid disorder - Thyroid Panel With TSH  4. Screening, lipid - Lipid panel  5. Elevated glucose - Hemoglobin A1c  7. Need for hepatitis C screening test - Hepatitis C Antibody  8. Breast cancer screening by mammogram - MM 3D SCREEN BREAST BILATERAL; Future    During the course of the visit the patient was educated and counseled about appropriate screening and preventive services including:    Family will assist with obtaining updated immunization records.  Diet review for nutrition referral? Yes ____  Not Indicated ___X_   Patient Instructions (the written plan) was given to the patient.  Medicare Attestation I have personally reviewed: The patient's medical and social history Their use of alcohol, tobacco or illicit drugs Their current medications and supplements The patient's functional ability including ADLs,fall risks, home safety risks, cognitive, and hearing and visual impairment Diet and physical activities Evidence for depression or mood disorders  The patient's weight, height, BMI, and  visual acuity have been recorded in the chart.  I have made referrals, counseling, and provided education to the patient based on review of the above and I have provided the patient with a written personalized care plan for preventive services.     Joaquin Courts, FNP   10/12/2018

## 2018-10-13 LAB — CBC WITH DIFFERENTIAL/PLATELET
Basophils Absolute: 0 10*3/uL (ref 0.0–0.2)
Basos: 0 %
EOS (ABSOLUTE): 0.1 10*3/uL (ref 0.0–0.4)
Eos: 2 %
Hematocrit: 40.6 % (ref 34.0–46.6)
Hemoglobin: 13.8 g/dL (ref 11.1–15.9)
Immature Grans (Abs): 0 10*3/uL (ref 0.0–0.1)
Immature Granulocytes: 0 %
LYMPHS: 38 %
Lymphocytes Absolute: 1.9 10*3/uL (ref 0.7–3.1)
MCH: 28.6 pg (ref 26.6–33.0)
MCHC: 34 g/dL (ref 31.5–35.7)
MCV: 84 fL (ref 79–97)
MONOCYTES: 11 %
Monocytes Absolute: 0.5 10*3/uL (ref 0.1–0.9)
Neutrophils Absolute: 2.5 10*3/uL (ref 1.4–7.0)
Neutrophils: 49 %
Platelets: 264 10*3/uL (ref 150–450)
RBC: 4.83 x10E6/uL (ref 3.77–5.28)
RDW: 12.6 % (ref 11.7–15.4)
WBC: 5 10*3/uL (ref 3.4–10.8)

## 2018-10-13 LAB — COMPREHENSIVE METABOLIC PANEL
ALT: 15 IU/L (ref 0–32)
AST: 17 IU/L (ref 0–40)
Albumin/Globulin Ratio: 1.7 (ref 1.2–2.2)
Albumin: 4.2 g/dL (ref 3.8–4.8)
Alkaline Phosphatase: 55 IU/L (ref 39–117)
BUN/Creatinine Ratio: 18 (ref 12–28)
BUN: 17 mg/dL (ref 8–27)
Bilirubin Total: 0.4 mg/dL (ref 0.0–1.2)
CHLORIDE: 102 mmol/L (ref 96–106)
CO2: 27 mmol/L (ref 20–29)
Calcium: 9.3 mg/dL (ref 8.7–10.3)
Creatinine, Ser: 0.97 mg/dL (ref 0.57–1.00)
GFR calc non Af Amer: 60 mL/min/{1.73_m2} (ref >59)
GFR, EST AFRICAN AMERICAN: 69 mL/min/{1.73_m2} (ref >59)
Globulin, Total: 2.5 g/dL (ref 1.5–4.5)
Glucose: 82 mg/dL (ref 65–99)
Potassium: 4 mmol/L (ref 3.5–5.2)
Sodium: 143 mmol/L (ref 134–144)
Total Protein: 6.7 g/dL (ref 6.0–8.5)

## 2018-10-13 LAB — LIPID PANEL
Chol/HDL Ratio: 2.5 ratio (ref 0.0–4.4)
Cholesterol, Total: 182 mg/dL (ref 100–199)
HDL: 72 mg/dL (ref >39)
LDL Calculated: 97 mg/dL (ref 0–99)
TRIGLYCERIDES: 66 mg/dL (ref 0–149)
VLDL Cholesterol Cal: 13 mg/dL (ref 5–40)

## 2018-10-13 LAB — THYROID PANEL WITH TSH
Free Thyroxine Index: 1.6 (ref 1.2–4.9)
T3 Uptake Ratio: 23 % — ABNORMAL LOW (ref 24–39)
T4, Total: 6.9 ug/dL (ref 4.5–12.0)
TSH: 1.01 u[IU]/mL (ref 0.450–4.500)

## 2018-10-13 LAB — HEMOGLOBIN A1C
Est. average glucose Bld gHb Est-mCnc: 111 mg/dL
Hgb A1c MFr Bld: 5.5 % (ref 4.8–5.6)

## 2018-10-13 LAB — HEPATITIS C ANTIBODY: Hep C Virus Ab: <0.1 s/co ratio (ref 0.0–0.9)

## 2018-10-17 ENCOUNTER — Encounter: Payer: Self-pay | Admitting: Family Medicine

## 2018-10-17 NOTE — Progress Notes (Signed)
Mail lab letter  

## 2018-10-18 ENCOUNTER — Telehealth: Payer: Self-pay | Admitting: Family Medicine

## 2018-10-18 NOTE — Telephone Encounter (Signed)
Called to follow up on order for bedside commode. [facility] didn't have it on file anymore after the call stating that it wasn't needed. They are asking for it to be re-faxed.

## 2018-10-18 NOTE — Telephone Encounter (Signed)
Patient's mother called stating that she needs the beside commode, stated that [facility] has not received anything and that she is getting very frustrated,please follow up.

## 2018-10-19 NOTE — Addendum Note (Signed)
Addended by: Bing Neighbors on: 10/19/2018 08:18 PM   Modules accepted: Level of Service

## 2018-10-23 ENCOUNTER — Telehealth: Payer: Self-pay | Admitting: Family Medicine

## 2018-10-23 NOTE — Telephone Encounter (Signed)
Patient sister came in to check on the status of the order for patient bedside commode. Patient sister was informed that Montel Clock called [facility] and was asked to re-fax the order. Please f/u with patient mother.

## 2018-10-27 ENCOUNTER — Telehealth: Payer: Self-pay

## 2018-10-27 NOTE — Telephone Encounter (Signed)
Call placed to [facility] to check on status of referral for [facility]. Spoke to [facility] who stated that office visit notes and demographics are needed.  This CM faxed requested information.

## 2019-01-16 ENCOUNTER — Other Ambulatory Visit: Payer: Self-pay | Admitting: Family Medicine

## 2019-01-22 ENCOUNTER — Other Ambulatory Visit: Payer: Self-pay | Admitting: Family Medicine

## 2019-01-22 ENCOUNTER — Telehealth: Payer: Self-pay | Admitting: Family Medicine

## 2019-01-22 NOTE — Telephone Encounter (Signed)
Asking to speak to a nurse

## 2019-01-22 NOTE — Telephone Encounter (Signed)
Left voice mail to call back 

## 2019-01-23 NOTE — Telephone Encounter (Signed)
Left voice mail to call back 

## 2019-01-24 ENCOUNTER — Other Ambulatory Visit: Payer: Self-pay | Admitting: Family Medicine

## 2019-02-19 ENCOUNTER — Telehealth: Payer: Self-pay | Admitting: Family Medicine

## 2019-02-19 NOTE — Telephone Encounter (Signed)
Appointment was scheduled.

## 2019-02-19 NOTE — Telephone Encounter (Signed)
Patient's mother called requesting a call back from the nurse, patient's mother would like a nebulizer for the patient, please follow up.

## 2019-02-19 NOTE — Telephone Encounter (Signed)
This will require a telehealth visit. Patient has not been seen since January.

## 2019-02-19 NOTE — Telephone Encounter (Signed)
Patient needs an appointment per provider.

## 2019-02-21 ENCOUNTER — Ambulatory Visit (INDEPENDENT_AMBULATORY_CARE_PROVIDER_SITE_OTHER): Payer: Medicare Other | Admitting: Family Medicine

## 2019-02-21 ENCOUNTER — Encounter: Payer: Self-pay | Admitting: Family Medicine

## 2019-02-21 ENCOUNTER — Ambulatory Visit: Payer: Medicare Other | Admitting: Family Medicine

## 2019-02-21 DIAGNOSIS — B9789 Other viral agents as the cause of diseases classified elsewhere: Secondary | ICD-10-CM | POA: Diagnosis not present

## 2019-02-21 DIAGNOSIS — R05 Cough: Secondary | ICD-10-CM

## 2019-02-21 DIAGNOSIS — J069 Acute upper respiratory infection, unspecified: Secondary | ICD-10-CM | POA: Diagnosis not present

## 2019-02-21 DIAGNOSIS — J4520 Mild intermittent asthma, uncomplicated: Secondary | ICD-10-CM | POA: Diagnosis not present

## 2019-02-21 MED ORDER — SERTRALINE HCL 50 MG PO TABS: 75.0000 mg | ORAL_TABLET | Freq: Every day | ORAL | 2 refills | Status: DC

## 2019-02-21 MED ORDER — PREDNISONE 10 MG PO TABS: 10.0000 mg | ORAL_TABLET | Freq: Every day | ORAL | 0 refills | Status: AC

## 2019-02-21 MED ORDER — ALBUTEROL SULFATE (2.5 MG/3ML) 0.083% IN NEBU: 2.5000 mg | INHALATION_SOLUTION | Freq: Four times a day (QID) | RESPIRATORY_TRACT | 12 refills | Status: DC | PRN

## 2019-02-21 NOTE — Progress Notes (Deleted)
Called patient to initiate their telephone visit with provider Joaquin Courts, FNP-C. Verified date of birth. Speaking with patient's mother [name]. She states that patient needs refills on Albuterol nebulizer solution. Mom also states that her Zoloft is supposed to be 75 mg instead of 50 mg. KWalker, CMA.

## 2019-02-21 NOTE — Progress Notes (Signed)
Virtual Visit via Telephone Note I connected with Alyssa Powell on 02/21/19 at  2:30 PM EDT by telephone and verified that I am speaking with the correct person using two identifiers.  Location: Patient: Located at primary care office, speaking with Mother with Alyssa Powell Provider: Located at primary care office   I discussed the limitations, risks, security and privacy concerns of performing an evaluation and management service by telephone and the availability of in person appointments. I also discussed with the patient that there may be a patient responsible charge related to this service. The patient expressed understanding and agreed to proceed.   History of Present Illness: Speaking with patient's mother, Alyssa Powell. Alyssa Powell is cognitively impaired. Mother is Regulatory affairs officer caretaker. She reports that Alyssa Powell recently, developed URI symptoms which have improved. During her recent illness, she used the last doses of her Albuterol nebulizer solution. She is still experiencing mild wheezing, mostly at bedtime. She has remained afebrile. She suffers from seasonal allergies and mother recalls patient was outside for a prolonged period of time prior to onset of  symptoms. She uses Flonase daily.Take no other medication for allergies.    Assessment and Plan: 1. Viral URI with cough 2. Mild intermittent asthma, unspecified whether complicated Improving. Will provide a short course of prednisone given wheezing remains present. If symptoms worsen or do not improve, follow-up immediately for further evaluation.   Meds ordered this encounter  Medications  . albuterol (PROVENTIL) (2.5 MG/3ML) 0.083% nebulizer solution    Sig: Take 3 mLs (2.5 mg total) by nebulization every 6 (six) hours as needed for wheezing or shortness of breath.    Dispense:  75 mL    Refill:  12  . predniSONE (DELTASONE) 10 MG tablet    Sig: Take 1 tablet (10 mg total) by mouth daily with breakfast for 5 days.    Dispense:  5 tablet    Refill:  0   . sertraline (ZOLOFT) 50 MG tablet    Sig: Take 1.5 tablets (75 mg total) by mouth daily.    Dispense:  45 tablet    Refill:  2    Follow Up Instructions: Keep scheduled follow-up    I discussed the assessment and treatment plan with the patient. The patient was provided an opportunity to ask questions and all were answered. The patient agreed with the plan and demonstrated an understanding of the instructions.   The patient was advised to call back or seek an in-person evaluation if the symptoms worsen or if the condition fails to improve as anticipated.  I provided 15 minutes of non-face-to-face time during this encounter.   Joaquin Courts, FNP

## 2019-02-26 ENCOUNTER — Other Ambulatory Visit: Payer: Self-pay

## 2019-02-26 MED ORDER — ALBUTEROL SULFATE (2.5 MG/3ML) 0.083% IN NEBU: 2.5000 mg | INHALATION_SOLUTION | Freq: Four times a day (QID) | RESPIRATORY_TRACT | 12 refills | Status: DC | PRN

## 2019-02-26 NOTE — Progress Notes (Signed)
Resent prescription for Albuterol nebulizer solution with ICD 10 code so that Medicare Part B would cover.

## 2019-04-07 ENCOUNTER — Other Ambulatory Visit: Payer: Self-pay | Admitting: Family Medicine

## 2019-05-11 ENCOUNTER — Other Ambulatory Visit: Payer: Self-pay | Admitting: Family Medicine

## 2019-05-11 NOTE — Telephone Encounter (Signed)
Medication refill request forwarded to PCP for review.

## 2019-05-25 ENCOUNTER — Other Ambulatory Visit: Payer: Self-pay | Admitting: Family Medicine

## 2019-05-25 NOTE — Telephone Encounter (Signed)
Requested medication (s) are due for refill today: yes  Requested medication (s) are on the active medication list:yes  Last refill:  04/09/2019  Future visit scheduled:no  Notes to clinic:  Review for refill   Requested Prescriptions  Pending Prescriptions Disp Refills   albuterol (VENTOLIN HFA) 108 (90 Base) MCG/ACT inhaler [Pharmacy Med Name: ALBUTEROL HFA (VENTOLIN) INH]  0    Sig: INHALE 1 TO 2 PUFFS INTO THE LUNGS EVERY 6 HOURS AS NEEDED     There is no refill protocol information for this order

## 2019-06-12 ENCOUNTER — Telehealth: Payer: Self-pay | Admitting: Family Medicine

## 2019-06-12 NOTE — Telephone Encounter (Signed)
Please advise 

## 2019-06-12 NOTE — Telephone Encounter (Signed)
1) Medication(s) Requested (by name): sertraline (ZOLOFT) 50 MG tablet [[id number]]   2) Pharmacy of Choice:  [id number] - [place], [place].   Patient requesting a 75mg  pill instead of 50mg , patient stating pharmacy is giving a hard time about taking 1.5 pills daily instead of 1 75mg  pill.   Approved medications will be sent to pharmacy, we will reach out to you if there is an issue.  Requests made after 3pm may not be addressed until following business day!

## 2019-06-13 MED ORDER — SERTRALINE HCL 50 MG PO TABS: 75.0000 mg | ORAL_TABLET | Freq: Every day | ORAL | 2 refills | Status: DC

## 2019-06-30 ENCOUNTER — Other Ambulatory Visit: Payer: Self-pay | Admitting: Family Medicine

## 2019-07-16 ENCOUNTER — Other Ambulatory Visit: Payer: Self-pay | Admitting: Family Medicine

## 2019-08-20 ENCOUNTER — Other Ambulatory Visit: Payer: Self-pay | Admitting: Family Medicine

## 2019-08-20 NOTE — Telephone Encounter (Signed)
Please fill if appropriate. Patient last seen at [facility] in July for viral illness. No visit since.

## 2019-08-21 ENCOUNTER — Ambulatory Visit (INDEPENDENT_AMBULATORY_CARE_PROVIDER_SITE_OTHER): Payer: Medicare Other | Admitting: Internal Medicine

## 2019-08-21 DIAGNOSIS — Z23 Encounter for immunization: Secondary | ICD-10-CM

## 2019-08-21 DIAGNOSIS — I10 Essential (primary) hypertension: Secondary | ICD-10-CM | POA: Diagnosis not present

## 2019-08-21 DIAGNOSIS — J4540 Moderate persistent asthma, uncomplicated: Secondary | ICD-10-CM

## 2019-08-21 DIAGNOSIS — M109 Gout, unspecified: Secondary | ICD-10-CM | POA: Insufficient documentation

## 2019-08-21 MED ORDER — ALBUTEROL SULFATE HFA 108 (90 BASE) MCG/ACT IN AERS: INHALATION_SPRAY | RESPIRATORY_TRACT | 6 refills | Status: DC

## 2019-08-21 MED ORDER — FLUTICASONE PROPIONATE 50 MCG/ACT NA SUSP: 2.0000 | Freq: Every day | NASAL | 2 refills | Status: DC

## 2019-08-21 MED ORDER — PREDNISONE 10 MG PO TABS: ORAL_TABLET | ORAL | 0 refills | Status: DC

## 2019-08-21 MED ORDER — HYDROCHLOROTHIAZIDE 25 MG PO TABS: 25.0000 mg | ORAL_TABLET | Freq: Every day | ORAL | 1 refills | Status: DC

## 2019-08-21 MED ORDER — BREO ELLIPTA 200-25 MCG/INH IN AEPB: 1.0000 | INHALATION_SPRAY | Freq: Every day | RESPIRATORY_TRACT | 6 refills | Status: DC

## 2019-08-21 NOTE — Progress Notes (Signed)
Virtual Visit via Telephone Note Due to current restrictions/limitations of in-office visits due to the COVID-19 pandemic, this scheduled clinical appointment was converted to a telehealth visit  I connected with Alyssa Powell on 08/21/19 at 1:41 p.m by telephone.  Patient has intellectual disability so history was given by her mother Alyssa Powell who is her caregiver.  I am in my office.  The patient and her mother are at home.  Only the patient's mother and myself participated in this encounter.  I discussed the limitations, risks, security and privacy concerns of performing an evaluation and management service by telephone and the availability of in person appointments. I also discussed with the patient that there may be a patient responsible charge related to this service. The patient expressed understanding and agreed to proceed.   History of Present Illness: Pt with hx of HTN, asthma, allergies, intellectual disability  Asthma: pt requesting new rxn for nebulizer machine.  She had machine for many yrs but it no longer works.  She has not had an asthma attacks in several mths but when she does, use of the neb helps to get things back under control.  Uses Breo every morning and Albuterol MDI 2 x a wk. Needing Rfs on inhalers  -no cough or wheezing lately -she has not had flu shot but mom wants her to have one  Mother also reports that pt is having a flare of gout in LT big toe and ankle. Endorses swelling and pain in toe and ankle.  Slight redness.   Dx with gout when she was [place] [facility] over 1 [place] ago. She was on Allopurinol at one point but out of it for over 1 yr.  Initial attack was in same jts where she is having flare now. Mom reports she has been eating a lot of red meat and sugary stuff.  Pt is on HCTZ.  Not on Amlodipine.  Most recent BP reading was 140/70 at Thanksgiving.    Observations/Objective: No direct observation done as this was a telephone encounter.  Assessment and Plan: 1.  Moderate persistent asthma without complication Well-controlled on Breo. Patient would benefit from having nebulizer machine at the house to use when she has flareups.  She has benefited from it in the past preventing hospitalization.  I have given a handwritten prescription to my CMA to send to medical supply store for them Refills given on inhalers.  Mother or patient sister will bring her to get flu shot. - albuterol (VENTOLIN HFA) 108 (90 Base) MCG/ACT inhaler; INHALE 1 TO 2 PUFFS INTO THE LUNGS EVERY 6 HOURS AS NEEDED  Dispense: 18 g; Refill: 6 - fluticasone furoate-vilanterol (BREO ELLIPTA) 200-25 MCG/INH AEPB; Inhale 1 puff into the lungs daily.  Dispense: 60 each; Refill: 6  2. Essential hypertension Last blood pressure reading not at goal.  I have told Alyssa Powell that the goal is 130/80 or lower.  She will keep a log to report on her next visit - hydrochlorothiazide (HYDRODIURIL) 25 MG tablet; Take 1 tablet (25 mg total) by mouth daily.  Dispense: 90 tablet; Refill: 1 - CBC; Future - Comprehensive metabolic panel; Future  3. Acute gout of left foot, unspecified cause Patient has only had one flare in the past year and has not been on allopurinol.  I will check a uric acid level to see whether we need to restart allopurinol.  Advised mother that hydrochlorothiazide can sometimes cause more frequent flareups.  If this starts to occur we can change this  to a different blood pressure medication. -Advise mother to cut back on giving her sugary snacks and less red meat.  Less fatty foods. We will give prednisone taper.  Check uric acid level - Uric Acid; Future - predniSONE (DELTASONE) 10 MG tablet; 3 tabs PO daily x 3 days then 2 tabs daily x 3 days then 1 tab daily x 3 days  Dispense: 18 tablet; Refill: 0  4. Need for influenza vaccination Appointment will be made for nurse only visit for flu vaccine   Follow Up Instructions: 3 mths   I discussed the assessment and treatment plan with the  patient. The patient was provided an opportunity to ask questions and all were answered. The patient agreed with the plan and demonstrated an understanding of the instructions.   The patient was advised to call back or seek an in-person evaluation if the symptoms worsen or if the condition fails to improve as anticipated.  I provided 21 minutes of non-face-to-face time during this encounter.   Jonah Blue, MD

## 2019-08-21 NOTE — Progress Notes (Signed)
Speaking with patient's mom.  She states that patient never received nebulizer machine. Has been unable to use her nebulizer solution. Has been using inhalers more frequently.  Also having a gout flare up in her ankles/toes x 6-7 days. Mom states that she hasn't had a flare in years. Does not remember what she took for it.

## 2019-11-14 ENCOUNTER — Ambulatory Visit (INDEPENDENT_AMBULATORY_CARE_PROVIDER_SITE_OTHER): Payer: Medicare Other | Admitting: Internal Medicine

## 2019-11-14 ENCOUNTER — Other Ambulatory Visit: Payer: Self-pay

## 2019-11-14 DIAGNOSIS — J4540 Moderate persistent asthma, uncomplicated: Secondary | ICD-10-CM

## 2019-11-14 DIAGNOSIS — K219 Gastro-esophageal reflux disease without esophagitis: Secondary | ICD-10-CM

## 2019-11-14 DIAGNOSIS — J309 Allergic rhinitis, unspecified: Secondary | ICD-10-CM

## 2019-11-14 DIAGNOSIS — F325 Major depressive disorder, single episode, in full remission: Secondary | ICD-10-CM | POA: Diagnosis not present

## 2019-11-14 DIAGNOSIS — R4189 Other symptoms and signs involving cognitive functions and awareness: Secondary | ICD-10-CM | POA: Diagnosis not present

## 2019-11-14 DIAGNOSIS — I10 Essential (primary) hypertension: Secondary | ICD-10-CM | POA: Diagnosis not present

## 2019-11-14 DIAGNOSIS — M1711 Unilateral primary osteoarthritis, right knee: Secondary | ICD-10-CM

## 2019-11-14 MED ORDER — HYDROCHLOROTHIAZIDE 25 MG PO TABS: 25.0000 mg | ORAL_TABLET | Freq: Every day | ORAL | 5 refills | Status: DC

## 2019-11-14 MED ORDER — SERTRALINE HCL 50 MG PO TABS: 75.0000 mg | ORAL_TABLET | Freq: Every day | ORAL | 5 refills | Status: DC

## 2019-11-14 MED ORDER — PANTOPRAZOLE SODIUM 40 MG PO TBEC: 40.0000 mg | DELAYED_RELEASE_TABLET | Freq: Every day | ORAL | 5 refills | Status: DC

## 2019-11-14 MED ORDER — MELOXICAM 7.5 MG PO TABS: 7.5000 mg | ORAL_TABLET | Freq: Every day | ORAL | 2 refills | Status: DC | PRN

## 2019-11-14 MED ORDER — BREO ELLIPTA 200-25 MCG/INH IN AEPB: 1.0000 | INHALATION_SPRAY | Freq: Every day | RESPIRATORY_TRACT | 6 refills | Status: DC

## 2019-11-14 MED ORDER — ALBUTEROL SULFATE HFA 108 (90 BASE) MCG/ACT IN AERS: INHALATION_SPRAY | RESPIRATORY_TRACT | 6 refills | Status: DC

## 2019-11-14 MED ORDER — CETIRIZINE HCL 10 MG PO TABS: 10.0000 mg | ORAL_TABLET | Freq: Every day | ORAL | 5 refills | Status: DC

## 2019-11-14 MED ORDER — FLUTICASONE PROPIONATE 50 MCG/ACT NA SUSP: 2.0000 | Freq: Every day | NASAL | 2 refills | Status: DC

## 2019-11-14 MED ORDER — FEXOFENADINE HCL 60 MG PO TABS: 60.0000 mg | ORAL_TABLET | Freq: Every day | ORAL | 5 refills | Status: DC

## 2019-11-14 MED ORDER — AMLODIPINE BESYLATE 5 MG PO TABS: 5.0000 mg | ORAL_TABLET | Freq: Every day | ORAL | 5 refills | Status: DC

## 2019-11-14 NOTE — Progress Notes (Signed)
Virtual Visit via Telephone Note  I connected with Alyssa Powell, on 11/14/2019 at 2:15 PM by telephone due to the COVID-19 pandemic and verified that I am speaking with the correct person using two identifiers.   Consent: I discussed the limitations, risks, security and privacy concerns of performing an evaluation and management service by telephone and the availability of in person appointments. I also discussed with the patient that there may be a patient responsible charge related to this service. The patient expressed understanding and agreed to proceed.   Location of Patient: Home   Location of Provider: Clinic    Persons participating in Telemedicine visit: Alyssa Powell [facility] Powell Dr. Earlene Powell  Patient's mother, Alyssa Powell      History of Present Illness: Visit conducted with patient's mother. Patient suffers from MR and her mother is her caretaker.   Has concerns about her arthritis in her right knee and hands. She seems to be flaring up more recently. Not able to get out of the house much due to COVID. Mother trying to encourage her to walk inside the house. Stiffness in the morning. Has gained weight from being stuck in the house, which mother suspects has worsened her OA. Requests medication for her arthritis, she has taken something prn in the past.   Does not monitor BP at home. Needs to put a battery in her monitor to be able to check. Requests some type of home health aid to help care for her daughter.   Requests refills for all medications.    Past Medical History:  Diagnosis Date  . Allergy   . Asthma   . Hypertension   . Mental retardation    Allergies  Allergen Reactions  . Amoxicillin Hives  . Codeine Phosphate Hives  . Penicillins     Childhood allergy    Current Outpatient Medications on File Prior to Visit  Medication Sig Dispense Refill  . albuterol (VENTOLIN HFA) 108 (90 Base) MCG/ACT inhaler INHALE 1 TO 2 PUFFS INTO THE LUNGS EVERY 6  HOURS AS NEEDED 18 g 6  . amLODipine (NORVASC) 5 MG tablet Take 1 tablet (5 mg total) by mouth daily. 30 tablet 3  . fexofenadine (ALLEGRA) 60 MG tablet Take 60 mg by mouth at bedtime.    . fluticasone (FLONASE) 50 MCG/ACT nasal spray Place 2 sprays into both nostrils daily. 16 g 2  . fluticasone furoate-vilanterol (BREO ELLIPTA) 200-25 MCG/INH AEPB Inhale 1 puff into the lungs daily. 60 each 6  . hydrochlorothiazide (HYDRODIURIL) 25 MG tablet Take 1 tablet (25 mg total) by mouth daily. 90 tablet 1  . pantoprazole (PROTONIX) 40 MG tablet Take 1 tablet (40 mg total) by mouth daily. 30 tablet 3  . sertraline (ZOLOFT) 50 MG tablet Take 1.5 tablets (75 mg total) by mouth daily. 45 tablet 2  . albuterol (PROVENTIL) (2.5 MG/3ML) 0.083% nebulizer solution Take 3 mLs (2.5 mg total) by nebulization every 6 (six) hours as needed for wheezing or shortness of breath. ICD 10: J45.40 (Patient not taking: Reported on 08/21/2019) 75 mL 12   No current facility-administered medications on file prior to visit.    Observations/Objective: NAD. Speaking clearly.  Work of breathing normal.  Alert and oriented. Mood appropriate.   Assessment and Plan: 1. Moderate persistent asthma without complication - albuterol (VENTOLIN HFA) 108 (90 Base) MCG/ACT inhaler; INHALE 1 TO 2 PUFFS INTO THE LUNGS EVERY 6 HOURS AS NEEDED  Dispense: 18 g; Refill: 6 - fluticasone furoate-vilanterol (BREO ELLIPTA) 200-25 MCG/INH  AEPB; Inhale 1 puff into the lungs daily.  Dispense: 60 each; Refill: 6  2. Essential hypertension Unsure if BP is at goal.  - amLODipine (NORVASC) 5 MG tablet; Take 1 tablet (5 mg total) by mouth daily.  Dispense: 30 tablet; Refill: 5 - hydrochlorothiazide (HYDRODIURIL) 25 MG tablet; Take 1 tablet (25 mg total) by mouth daily.  Dispense: 30 tablet; Refill: 5  3. Primary osteoarthritis of right knee Discussed that weight gain could be exacerbating condition. Mother is trying her best to encourage daily  activity. Reports a medication in the past has helped with pain tremendously. I see Mobic on prior medication list so will re-prescribe.  - meloxicam (MOBIC) 7.5 MG tablet; Take 1 tablet (7.5 mg total) by mouth daily as needed for pain.  Dispense: 30 tablet; Refill: 2  4. Allergic rhinitis, unspecified seasonality, unspecified trigger - fluticasone (FLONASE) 50 MCG/ACT nasal spray; Place 2 sprays into both nostrils daily.  Dispense: 16 g; Refill: 2 - fexofenadine (ALLEGRA) 60 MG tablet; Take 1 tablet (60 mg total) by mouth at bedtime.  Dispense: 30 tablet; Refill: 5  5. Gastroesophageal reflux disease, unspecified whether esophagitis present - pantoprazole (PROTONIX) 40 MG tablet; Take 1 tablet (40 mg total) by mouth daily.  Dispense: 30 tablet; Refill: 5  6. Major depressive disorder with single episode, in full remission ([place]) - sertraline (ZOLOFT) 50 MG tablet; Take 1.5 tablets (75 mg total) by mouth daily.  Dispense: 45 tablet; Refill: 5  7. Cognitive impairment Have done face to face requesting [place] [facility] and RN for medication management assistance and monitoring of BP.  - Home Health - Face-to-face encounter (required for Medicare/Medicaid patients)   Follow Up Instructions: For annual exam    I discussed the assessment and treatment plan with the patient. The patient was provided an opportunity to ask questions and all were answered. The patient agreed with the plan and demonstrated an understanding of the instructions.   The patient was advised to call back or seek an in-person evaluation if the symptoms worsen or if the condition fails to improve as anticipated.     I provided 22 minutes total of non-face-to-face time during this encounter including median intraservice time, reviewing previous notes, investigations, ordering medications, medical decision making, coordinating care and patient verbalized understanding at the end of the visit.    Alyssa Powell, D.O. Primary  Care at [facility]  11/14/2019, 2:15 PM

## 2020-01-01 ENCOUNTER — Telehealth: Payer: Self-pay | Admitting: Family Medicine

## 2020-01-01 NOTE — Telephone Encounter (Signed)
Please follow up on home health referral that was ordered in 10/2019.

## 2020-01-01 NOTE — Telephone Encounter (Signed)
Patient mother calling in regards to home health care. Please contact patient mother

## 2020-01-01 NOTE — Telephone Encounter (Signed)
Will you please call patient's mother to inquire what concerns/questions they have about home health care?   Thanks  Marcy Siren, D.O. Primary Care at [facility]  01/01/2020, 2:24 PM

## 2020-04-02 ENCOUNTER — Ambulatory Visit: Payer: Medicare Other | Attending: Critical Care Medicine

## 2020-04-02 DIAGNOSIS — Z23 Encounter for immunization: Secondary | ICD-10-CM

## 2020-04-02 NOTE — Progress Notes (Signed)
   Covid-19 Vaccination Clinic  Name:  Alyssa Powell    MRN: [id number] DOB: 1951/05/30  04/02/2020  Alyssa Powell was observed post Covid-19 immunization for 15 minutes without incident. She was provided with Vaccine Information Sheet and instruction to access the V-Safe system.   Alyssa Powell was instructed to call 911 with any severe reactions post vaccine: Marland Kitchen Difficulty breathing  . Swelling of face and throat  . A fast heartbeat  . A bad rash all over body  . Dizziness and weakness   Immunizations Administered    Name Date Dose VIS Date Route   Moderna COVID-19 Vaccine 04/02/2020 12:10 PM 0.5 mL 08/2019 Intramuscular   Manufacturer: Moderna   Lot: [id number]   NDC: 86168-372-90

## 2020-04-17 ENCOUNTER — Telehealth: Payer: Self-pay | Admitting: Family Medicine

## 2020-04-17 NOTE — Telephone Encounter (Signed)
Patients mother called in and requested to talk to pcp nurse regarding the patients "Alyssa Powell" paperwork. Please follow up at your earliest convenience.

## 2020-04-18 NOTE — Telephone Encounter (Signed)
Left voice mail to call back 

## 2020-04-30 ENCOUNTER — Ambulatory Visit: Payer: Medicare Other | Attending: Critical Care Medicine

## 2020-04-30 DIAGNOSIS — Z23 Encounter for immunization: Secondary | ICD-10-CM

## 2020-04-30 NOTE — Progress Notes (Signed)
   Covid-19 Vaccination Clinic  Name:  Kresha Abelson    MRN: [id number] DOB: 1950/11/28  04/30/2020  Ms. Brager was observed post Covid-19 immunization for 15 minutes without incident. She was provided with Vaccine Information Sheet and instruction to access the V-Safe system.   Ms. Jasmin was instructed to call 911 with any severe reactions post vaccine: Marland Kitchen Difficulty breathing  . Swelling of face and throat  . A fast heartbeat  . A bad rash all over body  . Dizziness and weakness   Immunizations Administered    Name Date Dose VIS Date Route   Moderna COVID-19 Vaccine 04/30/2020 10:33 AM 0.5 mL 08/2019 Intramuscular   Manufacturer: Moderna   Lot: [id number]   NDC: 53976-734-19

## 2020-05-06 ENCOUNTER — Other Ambulatory Visit: Payer: Self-pay

## 2020-05-06 DIAGNOSIS — I10 Essential (primary) hypertension: Secondary | ICD-10-CM

## 2020-05-06 DIAGNOSIS — K219 Gastro-esophageal reflux disease without esophagitis: Secondary | ICD-10-CM

## 2020-05-06 MED ORDER — PANTOPRAZOLE SODIUM 40 MG PO TBEC: 40.0000 mg | DELAYED_RELEASE_TABLET | Freq: Every day | ORAL | 0 refills | Status: DC

## 2020-05-06 MED ORDER — AMLODIPINE BESYLATE 5 MG PO TABS: 5.0000 mg | ORAL_TABLET | Freq: Every day | ORAL | 0 refills | Status: DC

## 2020-05-08 NOTE — Progress Notes (Signed)
Called pt was hung up on

## 2020-05-18 ENCOUNTER — Other Ambulatory Visit: Payer: Self-pay | Admitting: Family Medicine

## 2020-05-18 DIAGNOSIS — J309 Allergic rhinitis, unspecified: Secondary | ICD-10-CM

## 2020-08-04 ENCOUNTER — Telehealth: Payer: Self-pay

## 2020-08-04 DIAGNOSIS — J4540 Moderate persistent asthma, uncomplicated: Secondary | ICD-10-CM

## 2020-08-04 DIAGNOSIS — K219 Gastro-esophageal reflux disease without esophagitis: Secondary | ICD-10-CM

## 2020-08-04 DIAGNOSIS — F325 Major depressive disorder, single episode, in full remission: Secondary | ICD-10-CM

## 2020-08-04 DIAGNOSIS — I10 Essential (primary) hypertension: Secondary | ICD-10-CM

## 2020-08-04 NOTE — Telephone Encounter (Signed)
1) Medication(s) Requested (by name):albuterol (VENTOLIN HFA) 108 (90 Base) MCG/ACT inhaler    2) Pharmacy of Choice:[id number] - [place], [place] - [address]  [address] [place] [address]   3) Special Requests:   Approved medications will be sent to the pharmacy, we will reach out if there is an issue.  Requests made after 3pm may not be addressed until the following business day!  If a patient is unsure of the name of the medication(s) please note and ask patient to call back when they are able to provide all info, do not send to responsible party until all information is available!

## 2020-08-05 MED ORDER — AMLODIPINE BESYLATE 5 MG PO TABS: 5.0000 mg | ORAL_TABLET | Freq: Every day | ORAL | 0 refills | Status: DC

## 2020-08-05 MED ORDER — ALBUTEROL SULFATE HFA 108 (90 BASE) MCG/ACT IN AERS: INHALATION_SPRAY | RESPIRATORY_TRACT | 0 refills | Status: DC

## 2020-08-05 MED ORDER — HYDROCHLOROTHIAZIDE 25 MG PO TABS: 25.0000 mg | ORAL_TABLET | Freq: Every day | ORAL | 0 refills | Status: DC

## 2020-08-05 MED ORDER — PANTOPRAZOLE SODIUM 40 MG PO TBEC: 40.0000 mg | DELAYED_RELEASE_TABLET | Freq: Every day | ORAL | 0 refills | Status: DC

## 2020-08-05 MED ORDER — SERTRALINE HCL 50 MG PO TABS: 75.0000 mg | ORAL_TABLET | Freq: Every day | ORAL | 0 refills | Status: DC

## 2020-08-05 NOTE — Addendum Note (Signed)
Addended by: Heidi Dach on: 08/05/2020 10:13 AM   Modules accepted: Orders

## 2020-08-28 ENCOUNTER — Other Ambulatory Visit: Payer: Self-pay | Admitting: Internal Medicine

## 2020-08-28 DIAGNOSIS — K219 Gastro-esophageal reflux disease without esophagitis: Secondary | ICD-10-CM

## 2020-08-31 ENCOUNTER — Other Ambulatory Visit: Payer: Self-pay | Admitting: Internal Medicine

## 2020-08-31 DIAGNOSIS — J4540 Moderate persistent asthma, uncomplicated: Secondary | ICD-10-CM

## 2020-08-31 DIAGNOSIS — I10 Essential (primary) hypertension: Secondary | ICD-10-CM

## 2020-09-09 ENCOUNTER — Encounter: Payer: Self-pay | Admitting: Family

## 2020-09-09 ENCOUNTER — Ambulatory Visit (INDEPENDENT_AMBULATORY_CARE_PROVIDER_SITE_OTHER): Payer: Medicare (Managed Care) | Admitting: Family

## 2020-09-09 VITALS — BP 127/86 | HR 86 | Wt 226.0 lb

## 2020-09-09 DIAGNOSIS — Z1211 Encounter for screening for malignant neoplasm of colon: Secondary | ICD-10-CM

## 2020-09-09 DIAGNOSIS — F70 Mild intellectual disabilities: Secondary | ICD-10-CM | POA: Diagnosis not present

## 2020-09-09 DIAGNOSIS — Z0100 Encounter for examination of eyes and vision without abnormal findings: Secondary | ICD-10-CM

## 2020-09-09 DIAGNOSIS — I10 Essential (primary) hypertension: Secondary | ICD-10-CM | POA: Diagnosis not present

## 2020-09-09 DIAGNOSIS — Z9181 History of falling: Secondary | ICD-10-CM | POA: Diagnosis not present

## 2020-09-09 DIAGNOSIS — Z23 Encounter for immunization: Secondary | ICD-10-CM

## 2020-09-09 DIAGNOSIS — Z1231 Encounter for screening mammogram for malignant neoplasm of breast: Secondary | ICD-10-CM

## 2020-09-09 DIAGNOSIS — Z0000 Encounter for general adult medical examination without abnormal findings: Secondary | ICD-10-CM | POA: Diagnosis not present

## 2020-09-09 DIAGNOSIS — Z13820 Encounter for screening for osteoporosis: Secondary | ICD-10-CM

## 2020-09-09 NOTE — Patient Instructions (Addendum)
  Ms. Cohea , Thank you for taking time to come for your Medicare Wellness Visit. I appreciate your ongoing commitment to your health goals. Please review the following plan we discussed and let me know if I can assist you in the future.   These are the goals we discussed: Improve blood pressure.   This is a list of the screening recommended for you and due dates:  Health Maintenance  Topic Date Due  . Mammogram  Never done  . Colon Cancer Screening  Never done  . DEXA scan (bone density measurement)  Never done  . Pneumonia vaccines (1 of 2 - PCV13) Never done  . Tetanus Vaccine  03/06/2019  . Flu Shot  04/20/2020  . COVID-19 Vaccine (3 - Booster for Moderna series) 10/31/2020  .  Hepatitis C: One time screening is recommended by Center for Disease Control  (CDC) for  adults born from [birth year] through 1967.   Completed    Flu vaccine today.  Tdap vaccine today.  Return for Pneumonia vaccine and Shingles vaccine.   Referral to Ophthalmology for eye exam.  Referral for Mammogram.  Referral for Colonoscopy.  Referral for DEXA scan/bone density.  Follow-up with primary physician as scheduled for chronic disease management.

## 2020-09-09 NOTE — Progress Notes (Addendum)
Subjective:   Alyssa Powell is a 69 y.o. female who presents for Medicare Annual (Subsequent) preventive examination. Patient accompanied by her sister, Alyssa Powell, who serves as part historian.  Review of Systems    Cardiac Risk Factors include: hypertension   Objective:    Today's Vitals   09/09/20 1434  BP: (!) 109/107  Pulse: 86  SpO2: 96%  Weight: 226 lb (102.5 kg)   Body mass index is 34.36 kg/m.  Advanced Directives 09/09/2020 06/13/2017 08/03/2016 06/24/2016 06/18/2016 12/03/2015 11/21/2015  Does Patient Have a Medical Advance Directive? No No No No No No (No Data)  Would patient like information on creating a medical advance directive? Yes (Inpatient - patient defers creating a medical advance directive at this time - Information given) No - Patient declined No - patient declined information - No - patient declined information No - patient declined information -    Current Medications (verified) Outpatient Encounter Medications as of 09/09/2020  Medication Sig  . albuterol (VENTOLIN HFA) 108 (90 Base) MCG/ACT inhaler INHALE 1 TO 2 PUFFS INTO THE LUNGS EVERY 6 HOURS AS NEEDED  . amLODipine (NORVASC) 5 MG tablet Take 1 tablet (5 mg total) by mouth daily.  . cetirizine (ZYRTEC ALLERGY) 10 MG tablet Take 1 tablet (10 mg total) by mouth at bedtime.  . fluticasone (FLONASE) 50 MCG/ACT nasal spray SPRAY 2 SPRAYS INTO EACH NOSTRIL EVERY DAY  . hydrochlorothiazide (HYDRODIURIL) 25 MG tablet Take 1 tablet (25 mg total) by mouth daily.  . meloxicam (MOBIC) 7.5 MG tablet Take 1 tablet (7.5 mg total) by mouth daily as needed for pain.  . pantoprazole (PROTONIX) 40 MG tablet Take 1 tablet (40 mg total) by mouth daily.  . sertraline (ZOLOFT) 50 MG tablet Take 1.5 tablets (75 mg total) by mouth daily.  Marland Kitchen albuterol (PROVENTIL) (2.5 MG/3ML) 0.083% nebulizer solution Take 3 mLs (2.5 mg total) by nebulization every 6 (six) hours as needed for wheezing or shortness of breath. ICD 10: J45.40 (Patient  not taking: No sig reported)  . fluticasone furoate-vilanterol (BREO ELLIPTA) 200-25 MCG/INH AEPB Inhale 1 puff into the lungs daily. (Patient not taking: Reported on 09/09/2020)   No facility-administered encounter medications on file as of 09/09/2020.    Allergies (verified) Amoxicillin, Codeine phosphate, and Penicillins   History: Past Medical History:  Diagnosis Date  . Allergy   . Asthma   . Hypertension   . Mental retardation    Past Surgical History:  Procedure Laterality Date  . HERNIA REPAIR     Family History  Problem Relation Age of Onset  . Asthma Father   . Asthma Sister    Social History   Socioeconomic History  . Marital status: Single    Spouse name: Not on file  . Number of children: Not on file  . Years of education: Not on file  . Highest education level: Not on file  Occupational History  . Not on file  Tobacco Use  . Smoking status: Never Smoker  . Smokeless tobacco: Never Used  Vaping Use  . Vaping Use: Never used  Substance and Sexual Activity  . Alcohol use: Not Currently  . Drug use: Never  . Sexual activity: Never  Other Topics Concern  . Not on file  Social History Narrative   ** Merged History Encounter **       Social Determinants of Health   Financial Resource Strain: Not on file  Food Insecurity: Not on file  Transportation Needs: Not on file  Physical Activity: Not on file  Stress: Not on file  Social Connections: Not on file   Tobacco Counseling - Denies current and past history of tobacco.   Clinical Intake: - Chronic pain of bilateral knees. Does use a cane to assist with walking sometimes. Taking Meloxicam for pain which does help.  Diabetes: No  Diabetic? No  Interpreter Needed?: No  Activities of Daily Living In your present state of health, do you have any difficulty performing the following activities: 09/09/2020  Hearing? N  Vision? Y  Difficulty concentrating or making decisions? Y  Walking or  climbing stairs? N  Dressing or bathing? N  Doing errands, shopping? N  Preparing Food and eating ? Y  Using the Toilet? N  In the past six months, have you accidently leaked urine? N  Do you have problems with loss of bowel control? N  Managing your Medications? Y  Managing your Finances? Y  Housekeeping or managing your Housekeeping? Y  Some recent data might be hidden  - Does not wear eye glasses. Last eye exam at least 2 years ago. Would like referral.  - Patient's brother and son help prepare her food and medications.  - Patient's brother does housekeeping.  - Patient's mother manages her finances. - Patient says she really does not have difficulty concentrating and/or making decisions.  Patient Care Team: Arvilla Market, DO as PCP - General (Family Medicine) Tillman Sers, DO (Inactive)  Indicate any recent Medical Services you may have received from other than [facility] providers in the past year (date may be approximate).     Assessment:   This is a routine wellness examination for Alyssa Powell.  Hearing/Vision screen  Hearing Screening   125Hz  250Hz  500Hz  1000Hz  2000Hz  3000Hz  4000Hz  6000Hz  8000Hz   Right ear:           Left ear:             Visual Acuity Screening   Right eye Left eye Both eyes  Without correction: 20/40 20/50 20/40   With correction:     - Whisper Test normal bilateral.  Dietary issues and exercise activities discussed: Current Exercise Habits: The patient does not participate in regular exercise at present, Exercise limited by: None identified  - Patient reports she does enjoy eating raisin bread.  Goals   None   Patient would like to improve blood pressure within the next 1 year. Depression Screen PHQ 2/9 Scores 09/09/2020 11/14/2019 08/21/2019 10/12/2018 09/01/2018 09/08/2017 06/13/2017  PHQ - 2 Score 0 0 - 0 - 0 0  PHQ- 9 Score - 0 - 0 - - -  Exception Documentation - - Medical reason - Patient refusal - -  Not completed - - - - - - -     Fall Risk Fall Risk  09/09/2020 11/14/2019 09/08/2017 06/13/2017 12/03/2015  Falls in the past year? 0 0 No No No  Number falls in past yr: 0 0 - - -  Injury with Fall? 0 0 - - -  Risk for fall due to : No Fall Risks - - - -  Follow up Falls evaluation completed - - - -    FALL RISK PREVENTION PERTAINING TO THE HOME: Any stairs in or around the home? Yes, outside deck If so, are there any without handrails? Yes  Home free of loose throw rugs in walkways, pet beds, electrical cords, etc? Yes   Adequate lighting in your home to reduce risk of falls? Yes   ASSISTIVE DEVICES  UTILIZED TO PREVENT FALLS: Life alert? No  Use of a cane, walker or w/c? using cane sometimes Grab bars in the bathroom? Yes  Shower chair or bench in shower? Yes  Elevated toilet seat or a handicapped toilet? Yes   TIMED UP AND GO: Was the test performed? Yes .  Length of time to ambulate 10 feet: 45 sec.   Gait unsteady without use of assistive device, provider informed and interventions were implemented  Cognitive Function: MMSE - Mini Mental State Exam 09/09/2020 10/12/2018  Not completed: Unable to complete -  Orientation to time - 2  Orientation to Place - 3  Registration - 2  Attention/ Calculation - 0  Recall - 0  Language- name 2 objects - 2  Language- repeat - 0  Language- follow 3 step command - 3  Language- read & follow direction - 0  Write a sentence - 0  Copy design - 0  Total score - 12  - MMSE score 12/30. Patient also has history of mild intellectual disability since birth.   Immunizations Immunization History  Administered Date(s) Administered  . Influenza Whole 08/07/2009  . Influenza,inj,Quad PF,6+ Mos 07/09/2013, 08/05/2014, 07/08/2015  . Moderna Sars-Covid-2 Vaccination 04/02/2020, 04/30/2020  . Td 07/21/1994, 03/05/2009   TDAP status: Completed at today's visit  Flu Vaccine status: Completed at today's visit  Pneumococcal vaccine status: Due, Education has been provided  regarding the importance of this vaccine. Advised may receive this vaccine at local pharmacy or Health Dept. Aware to provide a copy of the vaccination record if obtained from local pharmacy or Health Dept. Verbalized acceptance and understanding.  Covid-19 vaccine status: Completed vaccines and eligible for booster vaccine when ready.    Qualifies for Shingles Vaccine? Yes, plans to return in the future for this.   Screening Tests Health Maintenance  Topic Date Due  . MAMMOGRAM  Never done  . COLONOSCOPY  Never done  . DEXA SCAN  Never done  . PNA vac Low Risk Adult (1 of 2 - PCV13) Never done  . TETANUS/TDAP  03/06/2019  . INFLUENZA VACCINE  04/20/2020  . COVID-19 Vaccine (3 - Booster for Moderna series) 10/31/2020  . Hepatitis C Screening  Completed   Health Maintenance  Health Maintenance Due  Topic Date Due  . MAMMOGRAM  Never done  . COLONOSCOPY  Never done  . DEXA SCAN  Never done  . PNA vac Low Risk Adult (1 of 2 - PCV13) Never done  . TETANUS/TDAP  03/06/2019  . INFLUENZA VACCINE  04/20/2020    Colorectal cancer screening: Referral to GI placed 09/09/2020. Pt aware the office will call re: appt.  Mammogram status: Ordered 09/09/2020. Pt provided with contact info and advised to call to schedule appt.   Bone Density status: Ordered 09/09/2020. Pt provided with contact info and advised to call to schedule appt.  Lung Cancer Screening: (Low Dose CT Chest recommended if Age 20-80 years, 30 pack-year currently smoking OR have quit w/in 15years.) does not qualify.   Lung Cancer Screening Referral: not applicable  Additional Screening:  Hepatitis C Screening: does not qualify; Completed 10/12/2018  Vision Screening: Recommended annual ophthalmology exams for early detection of glaucoma and other disorders of the eye. Is the patient up to date with their annual eye exam?  No  Who is the provider or what is the name of the office in which the patient attends annual eye  exams? Does not have a provider If pt is not established with a  provider, would they like to be referred to a provider to establish care? Yes .   Dental Screening: Recommended annual dental exams for proper oral hygiene  Community Resource Referral / Chronic Care Management: CRR required this visit?  No   CCM required this visit?  No      Plan:  1. Encounter for Medicare annual wellness exam: - Patient presents today for Medicare annual wellness exam.  - Discussed with patient what is a Stage managerMedical Advanced Directive. I went over with her Living Will and Healthcare Power of [place].  Patient given written packet also. She will look over it and consider executing a Living Will.  If she does, I requested that she brings a copy of it for our records.  2. Essential hypertension: - Blood pressure not at goal during today's visit. Patient asymptomatic without chest pressure, chest pain, palpitations, and shortness of breath. - Follow-up with primary physician as scheduled for chronic disease management.  3. Mild intellectual disability: - Mini Mental Status Examination score 12/30 today. Also has history of mild intellectual disability since birth. Able to communicate with provider and answer questions. Accompanied by her sister, Alyssa CoddingSunny, who serves as part historian. Patient does need family member as support when making medical decisions.  4. At high risk for falls: - Timed Up and Go Test 45 seconds.  - Patient at increased risk of falls.  - Counseled on using assistive devices as needed for ambulation.  - Continue using cane as needed.   5. Breast cancer screening by mammogram: - Referral for mammogram for breast cancer screening. - MM Digital Screening; Future  6. Colon cancer screening: - Referral to Gastroenterology for colon cancer screening by colonoscopy. - Ambulatory referral to Gastroenterology  7. Osteoporosis screening:  - DEXA scan to screen for risk of osteoporosis. - DG  DXA FRACTURE ASSESSMENT; Future  8. Eye exam, routine: - Referral to Ophthalmology for routine annual eye examination. - Ambulatory referral to Ophthalmology  9. Need for immunization against influenza: - Flu vaccine administered during today's visit. - Flu Vaccine QUAD 36+ mos IM  10. Need for tetanus, diphtheria, and acellular pertussis (Tdap) vaccine: - Tdap vaccine administered during today's visit.   I have personally reviewed and noted the following in the patient's chart:   . Medical and social history . Use of alcohol, tobacco or illicit drugs  . Current medications and supplements . Functional ability and status . Nutritional status . Physical activity . Advanced directives . List of other physicians . Hospitalizations, surgeries, and ER visits in previous 12 months . Vitals . Screenings to include cognitive, depression, and falls . Referrals and appointments  In addition, I have reviewed and discussed with patient certain preventive protocols, quality metrics, and best practice recommendations. A written personalized care plan for preventive services as well as general preventive health recommendations were provided to patient.     Rema FendtAmy J Disaya Walt, NP   09/09/2020

## 2020-09-09 NOTE — Progress Notes (Signed)
Annual exam Experiencing profuse sweating Bilateral Knee pain -fell on knees due to work being done around the house Pt experiencing trigger finger on right hand   Desires flu and tetanus

## 2020-09-15 ENCOUNTER — Other Ambulatory Visit: Payer: Self-pay | Admitting: Internal Medicine

## 2020-09-15 DIAGNOSIS — K219 Gastro-esophageal reflux disease without esophagitis: Secondary | ICD-10-CM

## 2020-09-16 ENCOUNTER — Other Ambulatory Visit: Payer: Self-pay | Admitting: Internal Medicine

## 2020-09-16 DIAGNOSIS — J4540 Moderate persistent asthma, uncomplicated: Secondary | ICD-10-CM

## 2020-10-02 ENCOUNTER — Other Ambulatory Visit: Payer: Self-pay

## 2020-10-02 DIAGNOSIS — J4540 Moderate persistent asthma, uncomplicated: Secondary | ICD-10-CM

## 2020-10-02 DIAGNOSIS — K219 Gastro-esophageal reflux disease without esophagitis: Secondary | ICD-10-CM

## 2020-10-02 MED ORDER — ALBUTEROL SULFATE HFA 108 (90 BASE) MCG/ACT IN AERS: 1.0000 | INHALATION_SPRAY | Freq: Four times a day (QID) | RESPIRATORY_TRACT | 1 refills | Status: DC | PRN

## 2020-10-02 MED ORDER — PANTOPRAZOLE SODIUM 40 MG PO TBEC: 40.0000 mg | DELAYED_RELEASE_TABLET | Freq: Every day | ORAL | 0 refills | Status: DC

## 2020-10-02 MED ORDER — ALBUTEROL SULFATE HFA 108 (90 BASE) MCG/ACT IN AERS: INHALATION_SPRAY | RESPIRATORY_TRACT | 1 refills | Status: DC

## 2020-10-02 NOTE — Progress Notes (Signed)
APPROVED REFILL

## 2020-10-08 ENCOUNTER — Other Ambulatory Visit: Payer: Self-pay

## 2020-10-08 ENCOUNTER — Other Ambulatory Visit: Payer: Self-pay | Admitting: Internal Medicine

## 2020-10-08 DIAGNOSIS — I10 Essential (primary) hypertension: Secondary | ICD-10-CM

## 2020-10-08 MED ORDER — AMLODIPINE BESYLATE 5 MG PO TABS: 5.0000 mg | ORAL_TABLET | Freq: Every day | ORAL | 1 refills | Status: DC

## 2020-10-08 NOTE — Progress Notes (Signed)
REFILL REQUEST APPROVED

## 2020-10-15 ENCOUNTER — Other Ambulatory Visit: Payer: Self-pay | Admitting: Family

## 2020-10-15 DIAGNOSIS — Z1231 Encounter for screening mammogram for malignant neoplasm of breast: Secondary | ICD-10-CM

## 2020-10-15 DIAGNOSIS — Z13820 Encounter for screening for osteoporosis: Secondary | ICD-10-CM

## 2020-10-23 ENCOUNTER — Ambulatory Visit: Payer: Medicare (Managed Care) | Admitting: Internal Medicine

## 2020-10-23 ENCOUNTER — Encounter: Payer: Medicare (Managed Care) | Admitting: Internal Medicine

## 2020-10-29 ENCOUNTER — Ambulatory Visit: Payer: Medicare (Managed Care) | Attending: Internal Medicine

## 2020-10-29 ENCOUNTER — Other Ambulatory Visit: Payer: Self-pay

## 2020-10-29 DIAGNOSIS — Z23 Encounter for immunization: Secondary | ICD-10-CM

## 2020-10-29 NOTE — Progress Notes (Signed)
   Covid-19 Vaccination Clinic  Name:  Laniece Hornbaker    MRN: [id number] DOB: 01/24/1951  10/29/2020  Ms. Carrasco was observed post Covid-19 immunization for 15 minutes without incident. She was provided with Vaccine Information Sheet and instruction to access the V-Safe system.   Ms. Wiemann was instructed to call 911 with any severe reactions post vaccine: Marland Kitchen Difficulty breathing  . Swelling of face and throat  . A fast heartbeat  . A bad rash all over body  . Dizziness and weakness   Immunizations Administered    Name Date Dose VIS Date Route   Moderna Covid-19 Booster Vaccine 10/29/2020 12:00 PM 0.25 mL 07/09/2020 Intramuscular   Manufacturer: Moderna   Lot: [id number]   NDC: 50569-794-80

## 2020-11-04 ENCOUNTER — Other Ambulatory Visit: Payer: Self-pay | Admitting: Internal Medicine

## 2020-11-04 DIAGNOSIS — F325 Major depressive disorder, single episode, in full remission: Secondary | ICD-10-CM

## 2020-11-06 ENCOUNTER — Encounter: Payer: Self-pay | Admitting: Internal Medicine

## 2020-11-06 ENCOUNTER — Other Ambulatory Visit: Payer: Self-pay

## 2020-11-06 ENCOUNTER — Telehealth (INDEPENDENT_AMBULATORY_CARE_PROVIDER_SITE_OTHER): Payer: Medicare (Managed Care) | Admitting: Internal Medicine

## 2020-11-06 DIAGNOSIS — I10 Essential (primary) hypertension: Secondary | ICD-10-CM

## 2020-11-06 DIAGNOSIS — J4541 Moderate persistent asthma with (acute) exacerbation: Secondary | ICD-10-CM

## 2020-11-06 MED ORDER — BREO ELLIPTA 200-25 MCG/INH IN AEPB
1.0000 | INHALATION_SPRAY | Freq: Every day | RESPIRATORY_TRACT | 6 refills | Status: DC
Start: 2020-11-06 — End: 2020-12-12

## 2020-11-06 MED ORDER — PREDNISONE 50 MG PO TABS: ORAL_TABLET | ORAL | 0 refills | Status: DC

## 2020-11-06 MED ORDER — HYDROCHLOROTHIAZIDE 25 MG PO TABS: 25.0000 mg | ORAL_TABLET | Freq: Every day | ORAL | 1 refills | Status: DC

## 2020-11-06 NOTE — Progress Notes (Signed)
-  Needs all meds RF  -Cough w/ mucous and sore throat x 3-4 wks  -MOP reports BP checks at home have "been good"

## 2020-11-06 NOTE — Progress Notes (Signed)
Virtual Visit via Telephone Note  I connected with Alyssa Powell, on 11/06/2020 at 10:05 AM by telephone due to the COVID-19 pandemic and verified that I am speaking with the correct person using two identifiers.   Consent: I discussed the limitations, risks, security and privacy concerns of performing an evaluation and management service by telephone and the availability of in person appointments. I also discussed with the patient that there may be a patient responsible charge related to this service. The patient expressed understanding and agreed to proceed.   Location of Patient: Home   Location of Provider: Clinic    Persons participating in Telemedicine visit: [name] [facility] Dr. [name] Bobeck-patient's mother      History of Present Illness: Patient has f/u HTN.  Chronic HTN Disease Monitoring:  Home BP Monitoring - Patient's sister monitors her BP and mother reports that it has been good but doesn't know the exact number.  Chest pain- no  Dyspnea- no Headache - no  Medications: Amlodipine 5 mg, HCTZ 25 mg Compliance- yes Lightheadedness- no  Edema- no    Patient's mother reports concerns about her breathing. Says she is having an asthma flare up. Has been coughing and wheezing. Having a sore throat. This occurs once per year. Believes she needs a course Prednisone. Has not been tested for COVID. Has been compliant with inhalers.     Past Medical History:  Diagnosis Date  . Allergy   . Asthma   . Hypertension   . Mental retardation    Allergies  Allergen Reactions  . Amoxicillin Hives  . Codeine Phosphate Hives  . Penicillins     Childhood allergy    Current Outpatient Medications on File Prior to Visit  Medication Sig Dispense Refill  . albuterol (PROVENTIL) (2.5 MG/3ML) 0.083% nebulizer solution Take 3 mLs (2.5 mg total) by nebulization every 6 (six) hours as needed for wheezing or shortness of breath. ICD 10:  J45.40 (Patient not taking: No sig reported) 75 mL 12  . albuterol (VENTOLIN HFA) 108 (90 Base) MCG/ACT inhaler Inhale 1-2 puffs into the lungs every 6 (six) hours as needed for wheezing or shortness of breath. 18 each 1  . amLODipine (NORVASC) 5 MG tablet Take 1 tablet (5 mg total) by mouth daily. 90 tablet 1  . cetirizine (ZYRTEC ALLERGY) 10 MG tablet Take 1 tablet (10 mg total) by mouth at bedtime. 30 tablet 5  . fluticasone (FLONASE) 50 MCG/ACT nasal spray SPRAY 2 SPRAYS INTO EACH NOSTRIL EVERY DAY 16 mL 6  . fluticasone furoate-vilanterol (BREO ELLIPTA) 200-25 MCG/INH AEPB Inhale 1 puff into the lungs daily. (Patient not taking: Reported on 09/09/2020) 60 each 6  . hydrochlorothiazide (HYDRODIURIL) 25 MG tablet Take 1 tablet (25 mg total) by mouth daily. 30 tablet 0  . meloxicam (MOBIC) 7.5 MG tablet Take 1 tablet (7.5 mg total) by mouth daily as needed for pain. 30 tablet 2  . pantoprazole (PROTONIX) 40 MG tablet Take 1 tablet (40 mg total) by mouth daily. 30 tablet 0  . sertraline (ZOLOFT) 50 MG tablet Take 1.5 tablets (75 mg total) by mouth daily. 45 tablet 0   No current facility-administered medications on file prior to visit.    Observations/Objective: NAD. Speaking clearly.  Work of breathing normal.  Alert and oriented. Mood appropriate.   Assessment and Plan: 1. Essential hypertension Reports BP has been at goal. Asymptomatic. Continue current regimen.  - hydrochlorothiazide (HYDRODIURIL) 25 MG tablet; Take 1 tablet (25 mg total)  by mouth daily.  Dispense: 90 tablet; Refill: 1  2. Moderate persistent asthma with acute exacerbation Given symptoms, will treat for acute exacerbation with steroid. Did encourage COVID testing just to ensure negative. Strict return precautions discussed.  - predniSONE (DELTASONE) 50 MG tablet; Take one tablet once per day for 5 days with breakfast.  Dispense: 5 tablet; Refill: 0 - fluticasone furoate-vilanterol (BREO ELLIPTA) 200-25 MCG/INH AEPB;  Inhale 1 puff into the lungs daily.  Dispense: 60 each; Refill: 6   Follow Up Instructions: 3 month f/u chronic medical conditions    I discussed the assessment and treatment plan with the patient. The patient was provided an opportunity to ask questions and all were answered. The patient agreed with the plan and demonstrated an understanding of the instructions.   The patient was advised to call back or seek an in-person evaluation if the symptoms worsen or if the condition fails to improve as anticipated.     I provided 8 minutes total of non-face-to-face time during this encounter including median intraservice time, reviewing previous notes, investigations, ordering medications, medical decision making, coordinating care and patient verbalized understanding at the end of the visit.    Marcy Siren, D.O. Primary Care at [facility]  11/06/2020, 10:05 AM

## 2020-11-07 NOTE — Telephone Encounter (Signed)
Zoloft refill request

## 2020-11-08 ENCOUNTER — Other Ambulatory Visit: Payer: Self-pay | Admitting: Internal Medicine

## 2020-11-08 DIAGNOSIS — J4540 Moderate persistent asthma, uncomplicated: Secondary | ICD-10-CM

## 2020-11-15 ENCOUNTER — Other Ambulatory Visit: Payer: Self-pay | Admitting: Internal Medicine

## 2020-11-15 DIAGNOSIS — K219 Gastro-esophageal reflux disease without esophagitis: Secondary | ICD-10-CM

## 2020-12-04 ENCOUNTER — Other Ambulatory Visit: Payer: Self-pay | Admitting: Internal Medicine

## 2020-12-04 ENCOUNTER — Telehealth: Payer: Self-pay | Admitting: Internal Medicine

## 2020-12-04 NOTE — Telephone Encounter (Signed)
[place] coordinator mailed to PCP forms for pt to receive home. Pt mother Alfie Alderfer calling to see if forms received and if PCP filled out forms and sent to [facility].  Call Ms. Clearance Coots [phone].

## 2020-12-08 ENCOUNTER — Telehealth: Payer: Self-pay | Admitting: Internal Medicine

## 2020-12-08 NOTE — Telephone Encounter (Signed)
Pt mother called for prednisone and inhaler, not the one in the red box , to [facility] [address]  . Pt symptoms since last visit with PCP Dec. 2021.  And did PCP receive forms for nursing care via fax?

## 2020-12-10 NOTE — Telephone Encounter (Signed)
Mother calling to check status of medications called in.

## 2020-12-12 ENCOUNTER — Other Ambulatory Visit: Payer: Self-pay | Admitting: Internal Medicine

## 2020-12-12 DIAGNOSIS — J4541 Moderate persistent asthma with (acute) exacerbation: Secondary | ICD-10-CM

## 2020-12-12 MED ORDER — PREDNISONE 50 MG PO TABS: ORAL_TABLET | ORAL | 0 refills | Status: DC

## 2020-12-12 MED ORDER — BREO ELLIPTA 200-25 MCG/INH IN AEPB: 1.0000 | INHALATION_SPRAY | Freq: Every day | RESPIRATORY_TRACT | 6 refills | Status: DC

## 2020-12-12 NOTE — Telephone Encounter (Signed)
I have sent in the medications as requested. I do not recall receiving any forms for nursing care and do not have any in the folder in my office.   Marcy Siren, D.O. Primary Care at [facility]  12/12/2020, 10:08 AM

## 2020-12-12 NOTE — Telephone Encounter (Signed)
Fax number was given to patient mother so she can have case manager fax over paperwork. Mother states she mailed forms 3 weeks ago.   Aware that medication was sent to pharmacy.

## 2020-12-15 ENCOUNTER — Other Ambulatory Visit: Payer: Self-pay | Admitting: Internal Medicine

## 2020-12-15 DIAGNOSIS — F325 Major depressive disorder, single episode, in full remission: Secondary | ICD-10-CM

## 2020-12-15 NOTE — Telephone Encounter (Signed)
Please advise on anxiety medication.

## 2020-12-23 ENCOUNTER — Telehealth: Payer: Self-pay | Admitting: Internal Medicine

## 2020-12-23 NOTE — Telephone Encounter (Signed)
Name and DOB verified Getting breathing treatments in the a.m. and HS Giving mucous relief but needs drying up She had been sent prednisone but pharmacist recommend ATB as well.   She has h/o asthma Denies SHOB and wheezes Does not know color of phlegm Sx's onset 12/12/2020, when prednisone was called.  Denies fever but mom has been giving Tylenol  Takes her zyrtec every evening. Has not tried sudafed medication.   Pls advise.    Advised to go to ED if symptoms progress or worsen.

## 2020-12-23 NOTE — Telephone Encounter (Signed)
Mother calling requesting antibiotic for mucus. States pharmacist recommended antibiotic.  Alyssa Powell [phone].

## 2020-12-24 ENCOUNTER — Other Ambulatory Visit: Payer: Self-pay | Admitting: Internal Medicine

## 2020-12-24 MED ORDER — AZITHROMYCIN 250 MG PO TABS: ORAL_TABLET | ORAL | 0 refills | Status: DC

## 2020-12-24 NOTE — Telephone Encounter (Signed)
Have sent in Azithromycin. If no improvement within 24-48 hours, will need f/u visit.   Marcy Siren, D.O. Primary Care at [facility]  12/24/2020, 11:17 AM

## 2020-12-26 NOTE — Telephone Encounter (Signed)
LVM Rx sent to pharmacy

## 2020-12-29 ENCOUNTER — Other Ambulatory Visit: Payer: Self-pay | Admitting: Internal Medicine

## 2020-12-29 DIAGNOSIS — J4540 Moderate persistent asthma, uncomplicated: Secondary | ICD-10-CM

## 2021-01-17 ENCOUNTER — Other Ambulatory Visit: Payer: Self-pay | Admitting: Internal Medicine

## 2021-01-17 DIAGNOSIS — F325 Major depressive disorder, single episode, in full remission: Secondary | ICD-10-CM

## 2021-01-22 ENCOUNTER — Other Ambulatory Visit: Payer: Self-pay | Admitting: Internal Medicine

## 2021-01-22 DIAGNOSIS — J309 Allergic rhinitis, unspecified: Secondary | ICD-10-CM

## 2021-02-12 ENCOUNTER — Other Ambulatory Visit: Payer: Self-pay | Admitting: Internal Medicine

## 2021-02-12 DIAGNOSIS — F325 Major depressive disorder, single episode, in full remission: Secondary | ICD-10-CM

## 2021-02-20 ENCOUNTER — Other Ambulatory Visit: Payer: Self-pay | Admitting: Internal Medicine

## 2021-02-20 DIAGNOSIS — K219 Gastro-esophageal reflux disease without esophagitis: Secondary | ICD-10-CM

## 2021-03-30 ENCOUNTER — Ambulatory Visit: Payer: Medicare (Managed Care) | Attending: Critical Care Medicine

## 2021-03-30 DIAGNOSIS — Z23 Encounter for immunization: Secondary | ICD-10-CM

## 2021-03-30 NOTE — Progress Notes (Signed)
   Covid-19 Vaccination Clinic  Name:  Kennesha Brewbaker    MRN: [id number] DOB: 06/14/1951  03/30/2021  Ms. Hardigree was observed post Covid-19 immunization for 15 minutes without incident. She was provided with Vaccine Information Sheet and instruction to access the V-Safe system.   Ms. Sublette was instructed to call 911 with any severe reactions post vaccine: Difficulty breathing  Swelling of face and throat  A fast heartbeat  A bad rash all over body  Dizziness and weakness   Immunizations Administered     Name Date Dose VIS Date Route   Moderna Covid-19 Booster Vaccine 03/30/2021 12:22 PM 0.25 mL 07/09/2020 Intramuscular   Manufacturer: Moderna   Lot: [id number]   NDC: 72897-915-04

## 2021-04-02 ENCOUNTER — Telehealth: Payer: Self-pay | Admitting: Internal Medicine

## 2021-04-02 NOTE — Telephone Encounter (Signed)
Patients mother called in stating patient needs refill. I informed mother patient needs an office visit, mother stated she just needs her medication. Appointment was scheduled for 04/09/21 with Eleonore Chiquito NP at [facility]

## 2021-04-07 ENCOUNTER — Other Ambulatory Visit: Payer: Self-pay

## 2021-04-07 DIAGNOSIS — J4541 Moderate persistent asthma with (acute) exacerbation: Secondary | ICD-10-CM

## 2021-04-07 DIAGNOSIS — J4540 Moderate persistent asthma, uncomplicated: Secondary | ICD-10-CM

## 2021-04-07 MED ORDER — FLUTICASONE FUROATE-VILANTEROL 200-25 MCG/INH IN AEPB: 1.0000 | INHALATION_SPRAY | Freq: Every day | RESPIRATORY_TRACT | 6 refills | Status: DC

## 2021-04-07 MED ORDER — ALBUTEROL SULFATE HFA 108 (90 BASE) MCG/ACT IN AERS: INHALATION_SPRAY | RESPIRATORY_TRACT | 2 refills | Status: DC

## 2021-04-07 NOTE — Telephone Encounter (Signed)
Patients mother stated she has called her pharmacy and they told her that the medication has not been called in. Patient is needing refills  fluticasone furoate-vilanterol (BREO ELLIPTA) 200-25 MCG/INH AEPB  albuterol (VENTOLIN HFA) 108 (90 Base) MCG/ACT inhaler     Pharmacy  [facility] [address], [place] - [address]  [address] [place] [address]  Phone:  [phone]  Fax:  [phone]  DEA #:  [id number]

## 2021-04-07 NOTE — Telephone Encounter (Signed)
Medication refilled

## 2021-04-09 ENCOUNTER — Ambulatory Visit: Payer: Medicare (Managed Care) | Admitting: Family

## 2021-04-10 ENCOUNTER — Ambulatory Visit: Payer: Medicare (Managed Care) | Admitting: Family

## 2021-04-16 ENCOUNTER — Ambulatory Visit: Payer: Medicare (Managed Care) | Admitting: Family

## 2021-04-28 ENCOUNTER — Ambulatory Visit: Payer: Medicare (Managed Care) | Admitting: Family Medicine

## 2021-04-29 ENCOUNTER — Other Ambulatory Visit: Payer: Self-pay

## 2021-04-29 ENCOUNTER — Ambulatory Visit (INDEPENDENT_AMBULATORY_CARE_PROVIDER_SITE_OTHER): Payer: Medicare (Managed Care) | Admitting: Family Medicine

## 2021-04-29 ENCOUNTER — Encounter: Payer: Self-pay | Admitting: Family Medicine

## 2021-04-29 VITALS — BP 122/81 | HR 72 | Temp 97.9°F | Resp 15 | Ht 63.39 in | Wt 219.4 lb

## 2021-04-29 DIAGNOSIS — K219 Gastro-esophageal reflux disease without esophagitis: Secondary | ICD-10-CM

## 2021-04-29 DIAGNOSIS — M1711 Unilateral primary osteoarthritis, right knee: Secondary | ICD-10-CM | POA: Diagnosis not present

## 2021-04-29 DIAGNOSIS — I10 Essential (primary) hypertension: Secondary | ICD-10-CM | POA: Diagnosis not present

## 2021-04-29 DIAGNOSIS — F70 Mild intellectual disabilities: Secondary | ICD-10-CM

## 2021-04-29 DIAGNOSIS — J4540 Moderate persistent asthma, uncomplicated: Secondary | ICD-10-CM

## 2021-04-29 DIAGNOSIS — F325 Major depressive disorder, single episode, in full remission: Secondary | ICD-10-CM

## 2021-04-29 MED ORDER — MELOXICAM 7.5 MG PO TABS: 7.5000 mg | ORAL_TABLET | Freq: Every day | ORAL | 0 refills | Status: DC | PRN

## 2021-04-29 MED ORDER — HYDROCHLOROTHIAZIDE 25 MG PO TABS: 25.0000 mg | ORAL_TABLET | Freq: Every day | ORAL | 0 refills | Status: DC

## 2021-04-29 MED ORDER — SERTRALINE HCL 50 MG PO TABS
ORAL_TABLET | ORAL | 0 refills | Status: DC
Start: 2021-04-29 — End: 2021-10-02

## 2021-04-29 MED ORDER — AMLODIPINE BESYLATE 5 MG PO TABS: 5.0000 mg | ORAL_TABLET | Freq: Every day | ORAL | 0 refills | Status: DC

## 2021-04-29 MED ORDER — PANTOPRAZOLE SODIUM 40 MG PO TBEC: 40.0000 mg | DELAYED_RELEASE_TABLET | Freq: Every day | ORAL | 0 refills | Status: DC

## 2021-04-29 NOTE — Progress Notes (Signed)
Established Patient Office Visit  Subjective:  Patient ID: Alyssa Powell, female    DOB: 1951-09-07  Age: 70 y.o. MRN: [id number]  CC:  Chief Complaint  Patient presents with   Follow-up    4 month    Medication Refill    HPI [name] presents for routine follow up of multiple chronic med issues primarily for med refills. Patient denies any acute complaints or concerns   Past Medical History:  Diagnosis Date   Allergy    Asthma    Hypertension    Mental retardation     Past Surgical History:  Procedure Laterality Date   HERNIA REPAIR      Family History  Problem Relation Age of Onset   Asthma Father    Asthma Sister     Social History   Socioeconomic History   Marital status: Single    Spouse name: Not on file   Number of children: Not on file   Years of education: Not on file   Highest education level: Not on file  Occupational History   Not on file  Tobacco Use   Smoking status: Never   Smokeless tobacco: Never  Vaping Use   Vaping Use: Never used  Substance and Sexual Activity   Alcohol use: Not Currently   Drug use: Never   Sexual activity: Never  Other Topics Concern   Not on file  Social History Narrative   ** Merged History Encounter **       Social Determinants of Health   Financial Resource Strain: Not on file  Food Insecurity: Not on file  Transportation Needs: Not on file  Physical Activity: Not on file  Stress: Not on file  Social Connections: Not on file  Intimate Partner Violence: Not on file    Outpatient Medications Prior to Visit  Medication Sig Dispense Refill   albuterol (PROVENTIL) (2.5 MG/3ML) 0.083% nebulizer solution Take 3 mLs (2.5 mg total) by nebulization every 6 (six) hours as needed for wheezing or shortness of breath. ICD 10: J45.40 75 mL 12   albuterol (VENTOLIN HFA) 108 (90 Base) MCG/ACT inhaler INHALE 1-2 PUFFS BY MOUTH EVERY 6 HOURS AS NEEDED FOR WHEEZE OR SHORTNESS OF BREATH 18 each 2   amLODipine  (NORVASC) 5 MG tablet Take 1 tablet (5 mg total) by mouth daily. 90 tablet 1   azithromycin (ZITHROMAX) 250 MG tablet Take two tablets on day one. Take one tablet daily for 4 more days. 6 tablet 0   cetirizine (ZYRTEC) 10 MG tablet TAKE 1 TABLET BY MOUTH EVERYDAY AT BEDTIME 90 tablet 1   fluticasone (FLONASE) 50 MCG/ACT nasal spray SPRAY 2 SPRAYS INTO EACH NOSTRIL EVERY DAY 48 mL 2   fluticasone furoate-vilanterol (BREO ELLIPTA) 200-25 MCG/INH AEPB Inhale 1 puff into the lungs daily. 60 each 6   hydrochlorothiazide (HYDRODIURIL) 25 MG tablet Take 1 tablet (25 mg total) by mouth daily. 90 tablet 1   meloxicam (MOBIC) 7.5 MG tablet Take 1 tablet (7.5 mg total) by mouth daily as needed for pain. 30 tablet 2   pantoprazole (PROTONIX) 40 MG tablet TAKE 1 TABLET BY MOUTH EVERY DAY 90 tablet 1   predniSONE (DELTASONE) 50 MG tablet Take one tablet once per day for 5 days with breakfast. 5 tablet 0   sertraline (ZOLOFT) 50 MG tablet TAKE 1 AND 1/2 TABLETS DAILY BY MOUTH -EMERGENCY FILL- 45 tablet 0   No facility-administered medications prior to visit.    Allergies  Allergen Reactions  Amoxicillin Hives   Codeine Phosphate Hives   Penicillins     Childhood allergy    ROS Review of Systems  Constitutional:  Negative for fatigue and unexpected weight change.  All other systems reviewed and are negative.    Objective:    Physical Exam Vitals and nursing note reviewed.  Constitutional:      General: She is not in acute distress.    Appearance: She is obese.  Cardiovascular:     Rate and Rhythm: Normal rate and regular rhythm.  Pulmonary:     Effort: Pulmonary effort is normal.     Breath sounds: Normal breath sounds.  Abdominal:     Palpations: Abdomen is soft.     Tenderness: There is no abdominal tenderness.  Neurological:     General: No focal deficit present.     Mental Status: She is alert. Mental status is at baseline.  Psychiatric:        Mood and Affect: Mood normal.         Behavior: Behavior normal.    BP 122/81 (BP Location: Left Arm, Patient Position: Sitting, Cuff Size: Large)   Pulse 72   Temp 97.9 F (36.6 C)   Resp 15   Ht 5' 3.39" (1.61 m)   Wt 219 lb 6.4 oz (99.5 kg)   SpO2 97%   BMI 38.39 kg/m  Wt Readings from Last 3 Encounters:  04/29/21 219 lb 6.4 oz (99.5 kg)  09/09/20 226 lb (102.5 kg)  10/12/18 211 lb (95.7 kg)     Health Maintenance Due  Topic Date Due   COLONOSCOPY (Pts 45-69yrs Insurance coverage will need to be confirmed)  Never done   MAMMOGRAM  Never done   Zoster Vaccines- Shingrix (1 of 2) Never done   DEXA SCAN  Never done   PNA vac Low Risk Adult (1 of 2 - PCV13) Never done   INFLUENZA VACCINE  04/20/2021    There are no preventive care reminders to display for this patient.  Lab Results  Component Value Date   TSH 1.010 10/12/2018   Lab Results  Component Value Date   WBC 5.0 10/12/2018   HGB 13.8 10/12/2018   HCT 40.6 10/12/2018   MCV 84 10/12/2018   PLT 264 10/12/2018   Lab Results  Component Value Date   NA 143 10/12/2018   K 4.0 10/12/2018   CO2 27 10/12/2018   GLUCOSE 82 10/12/2018   BUN 17 10/12/2018   CREATININE 0.97 10/12/2018   BILITOT 0.4 10/12/2018   ALKPHOS 55 10/12/2018   AST 17 10/12/2018   ALT 15 10/12/2018   PROT 6.7 10/12/2018   ALBUMIN 4.2 10/12/2018   CALCIUM 9.3 10/12/2018   ANIONGAP 8 06/24/2016   Lab Results  Component Value Date   CHOL 182 10/12/2018   Lab Results  Component Value Date   HDL 72 10/12/2018   Lab Results  Component Value Date   LDLCALC 97 10/12/2018   Lab Results  Component Value Date   TRIG 66 10/12/2018   Lab Results  Component Value Date   CHOLHDL 2.5 10/12/2018   Lab Results  Component Value Date   HGBA1C 5.5 10/12/2018      Assessment & Plan:   Essential hypertension - meds refilled - Plan: amLODipine (NORVASC) 5 MG tablet, hydrochlorothiazide (HYDRODIURIL) 25 MG tablet  Moderate persistent asthma without complication - meds  refilled  Mild intellectual disability - appears stable  Primary osteoarthritis of right knee - continue present management -  Plan: meloxicam (MOBIC) 7.5 MG tablet  Gastroesophageal reflux disease without esophagitis - meds refilled - Plan: pantoprazole (PROTONIX) 40 MG tablet  Major depressive disorder with single episode, in full remission (HCC) - appears stable - continue - meds refilled - Plan: sertraline (ZOLOFT) 50 MG tablet    No orders of the defined types were placed in this encounter.   Follow-up: Return in about 6 months (around 10/30/2021) for chronic med issues.    Tommie Raymond, MD

## 2021-04-29 NOTE — Progress Notes (Signed)
Pt presents for 4 month follow-up accompanied by sister [name] pt reports need medication refills

## 2021-07-04 ENCOUNTER — Other Ambulatory Visit: Payer: Self-pay | Admitting: Family Medicine

## 2021-07-04 ENCOUNTER — Other Ambulatory Visit: Payer: Self-pay | Admitting: Family

## 2021-07-04 DIAGNOSIS — J4540 Moderate persistent asthma, uncomplicated: Secondary | ICD-10-CM

## 2021-07-04 DIAGNOSIS — M1711 Unilateral primary osteoarthritis, right knee: Secondary | ICD-10-CM

## 2021-07-10 ENCOUNTER — Other Ambulatory Visit: Payer: Self-pay | Admitting: *Deleted

## 2021-07-10 MED ORDER — CETIRIZINE HCL 10 MG PO TABS: ORAL_TABLET | ORAL | 1 refills | Status: DC

## 2021-09-10 ENCOUNTER — Encounter: Payer: Medicare (Managed Care) | Admitting: Internal Medicine

## 2021-09-11 ENCOUNTER — Encounter: Payer: Medicare (Managed Care) | Admitting: Family

## 2021-09-11 ENCOUNTER — Encounter: Payer: Medicare (Managed Care) | Admitting: Family Medicine

## 2021-09-17 ENCOUNTER — Other Ambulatory Visit: Payer: Self-pay | Admitting: Internal Medicine

## 2021-09-17 DIAGNOSIS — F325 Major depressive disorder, single episode, in full remission: Secondary | ICD-10-CM

## 2021-09-30 ENCOUNTER — Other Ambulatory Visit: Payer: Self-pay | Admitting: Internal Medicine

## 2021-09-30 DIAGNOSIS — I10 Essential (primary) hypertension: Secondary | ICD-10-CM

## 2021-10-01 ENCOUNTER — Telehealth: Payer: Self-pay | Admitting: Family Medicine

## 2021-10-01 NOTE — Telephone Encounter (Signed)
PT'S mother Alyssa Powell calling stating Pharmacy needs prior aurthorization for  hydrochlorothiazide (HYDRODIURIL) 25 MG tablet [[id number]]     also needs  medication refills for Alyssa Powell  meloxicam (MOBIC) 7.5 MG tablet [[id number]]   pantoprazole (PROTONIX) 40 MG tablet [[id number]]   albuterol (VENTOLIN HFA) 108 (90 Base) MCG/ACT inhaler [[id number]]  And states the red bottle doesn't work as great as the [employer]. IF NOTE CAN BE ADDED for this.   Pharmacy  [facility] [address] [place] - [address]  [address] [place] [address]  Phone:  [phone]  Fax:  [phone]

## 2021-10-02 ENCOUNTER — Other Ambulatory Visit: Payer: Self-pay | Admitting: *Deleted

## 2021-10-02 ENCOUNTER — Other Ambulatory Visit: Payer: Self-pay | Admitting: Family Medicine

## 2021-10-02 DIAGNOSIS — M1711 Unilateral primary osteoarthritis, right knee: Secondary | ICD-10-CM

## 2021-10-02 DIAGNOSIS — K219 Gastro-esophageal reflux disease without esophagitis: Secondary | ICD-10-CM

## 2021-10-02 DIAGNOSIS — J4540 Moderate persistent asthma, uncomplicated: Secondary | ICD-10-CM

## 2021-10-02 DIAGNOSIS — I10 Essential (primary) hypertension: Secondary | ICD-10-CM

## 2021-10-02 DIAGNOSIS — F325 Major depressive disorder, single episode, in full remission: Secondary | ICD-10-CM

## 2021-10-02 MED ORDER — HYDROCHLOROTHIAZIDE 25 MG PO TABS: 25.0000 mg | ORAL_TABLET | Freq: Every day | ORAL | 0 refills | Status: DC

## 2021-10-02 MED ORDER — ALBUTEROL SULFATE HFA 108 (90 BASE) MCG/ACT IN AERS: INHALATION_SPRAY | RESPIRATORY_TRACT | 2 refills | Status: DC

## 2021-10-02 MED ORDER — PANTOPRAZOLE SODIUM 40 MG PO TBEC: 40.0000 mg | DELAYED_RELEASE_TABLET | Freq: Every day | ORAL | 0 refills | Status: DC

## 2021-10-02 MED ORDER — AMLODIPINE BESYLATE 5 MG PO TABS: 5.0000 mg | ORAL_TABLET | Freq: Every day | ORAL | 0 refills | Status: DC

## 2021-10-02 MED ORDER — MELOXICAM 7.5 MG PO TABS: 7.5000 mg | ORAL_TABLET | Freq: Every day | ORAL | 0 refills | Status: DC | PRN

## 2021-10-02 NOTE — Telephone Encounter (Signed)
Called patient and resent medication

## 2021-10-12 ENCOUNTER — Ambulatory Visit: Payer: Medicare (Managed Care) | Admitting: Family Medicine

## 2021-11-27 ENCOUNTER — Other Ambulatory Visit: Payer: Self-pay | Admitting: Family Medicine

## 2021-11-27 DIAGNOSIS — F325 Major depressive disorder, single episode, in full remission: Secondary | ICD-10-CM

## 2021-12-02 ENCOUNTER — Telehealth: Payer: Self-pay | Admitting: Family Medicine

## 2021-12-02 DIAGNOSIS — K219 Gastro-esophageal reflux disease without esophagitis: Secondary | ICD-10-CM

## 2021-12-04 ENCOUNTER — Ambulatory Visit (INDEPENDENT_AMBULATORY_CARE_PROVIDER_SITE_OTHER): Payer: Medicare (Managed Care)

## 2021-12-04 ENCOUNTER — Other Ambulatory Visit: Payer: Self-pay

## 2021-12-04 VITALS — Ht 63.0 in

## 2021-12-04 DIAGNOSIS — Z0000 Encounter for general adult medical examination without abnormal findings: Secondary | ICD-10-CM

## 2021-12-04 DIAGNOSIS — Z5982 Transportation insecurity: Secondary | ICD-10-CM | POA: Diagnosis not present

## 2021-12-04 NOTE — Progress Notes (Addendum)
? ?Subjective:  ? Alyssa Powell is a 71 y.o. female who presents for Medicare Annual (Subsequent) preventive examination. ?I connected with  Alyssa Powell on 12/04/2021 by a audio enabled telemedicine application and verified that I am speaking with the correct person using two identifiers. ? ?Patient Location: Home ? ?Provider Location: Office/Clinic ? ?I discussed the limitations of evaluation and management by telemedicine. The patient expressed understanding and agreed to proceed.  ? ?Review of Systems    ? ?  ? ?   ?Objective:  ?  ?Today's Vitals  ? 12/04/21 1348 01/07/22 1145  ?Height: 5\' 3"  (1.6 m)   ?PainSc:  8   ? ?Body mass index is 38.86 kg/m?. ? ? ?  09/09/2020  ?  2:21 PM 06/13/2017  ?  1:58 PM 08/03/2016  ?  2:36 PM 06/24/2016  ?  4:31 PM 06/18/2016  ?  2:12 PM 12/03/2015  ?  3:22 PM  ?Advanced Directives  ?Does Patient Have a Medical Advance Directive? No No No No No No  ?Would patient like information on creating a medical advance directive? Yes (Inpatient - patient defers creating a medical advance directive at this time - Information given) No - Patient declined No - patient declined information  No - patient declined information No - patient declined information  ? ? ?Current Medications (verified) ?Outpatient Encounter Medications as of 12/04/2021  ?Medication Sig  ? albuterol (VENTOLIN HFA) 108 (90 Base) MCG/ACT inhaler INHALE 1-2 PUFFS BY MOUTH EVERY 6 HOURS AS NEEDED FOR WHEEZE OR SHORTNESS OF BREATH  ? amLODipine (NORVASC) 5 MG tablet Take 1 tablet (5 mg total) by mouth daily.  ? cetirizine (ZYRTEC) 10 MG tablet TAKE 1 TABLET BY MOUTH EVERYDAY AT BEDTIME  ? fluticasone (FLONASE) 50 MCG/ACT nasal spray SPRAY 2 SPRAYS INTO EACH NOSTRIL EVERY DAY  ? fluticasone furoate-vilanterol (BREO ELLIPTA) 200-25 MCG/INH AEPB Inhale 1 puff into the lungs daily.  ? hydrochlorothiazide (HYDRODIURIL) 25 MG tablet Take 1 tablet (25 mg total) by mouth daily.  ? meloxicam (MOBIC) 7.5 MG tablet Take 1 tablet (7.5 mg  total) by mouth daily as needed. for pain  ? pantoprazole (PROTONIX) 40 MG tablet TAKE 1 TABLET BY MOUTH EVERY DAY  ? [DISCONTINUED] sertraline (ZOLOFT) 50 MG tablet TAKE 1 AND 1/2 TABLETS BY MOUTH DAILY  ? ?No facility-administered encounter medications on file as of 12/04/2021.  ? ? ?Allergies (verified) ?Amoxicillin, Codeine phosphate, and Penicillins  ? ?History: ?Past Medical History:  ?Diagnosis Date  ? Allergy   ? Asthma   ? Hypertension   ? Mental retardation   ? ?Past Surgical History:  ?Procedure Laterality Date  ? HERNIA REPAIR    ? ?Family History  ?Problem Relation Age of Onset  ? Arthritis Mother   ? Asthma Father   ? Asthma Sister   ? ?Social History  ? ?Socioeconomic History  ? Marital status: Single  ?  Spouse name: Not on file  ? Number of children: Not on file  ? Years of education: Not on file  ? Highest education level: Not on file  ?Occupational History  ? Not on file  ?Tobacco Use  ? Smoking status: Never  ? Smokeless tobacco: Never  ?Vaping Use  ? Vaping Use: Never used  ?Substance and Sexual Activity  ? Alcohol use: Not Currently  ? Drug use: Never  ? Sexual activity: Never  ?Other Topics Concern  ? Not on file  ?Social History Narrative  ? ** Merged History Encounter **  ?    ? ?  Social Determinants of Health  ? ?Financial Resource Strain: Low Risk   ? Difficulty of Paying Living Expenses: Not very hard  ?Food Insecurity: No Food Insecurity  ? Worried About Programme researcher, broadcasting/film/video in the Last Year: Never true  ? Ran Out of Food in the Last Year: Never true  ?Transportation Needs: Unmet Transportation Needs  ? Lack of Transportation (Medical): Yes  ? Lack of Transportation (Non-Medical): Yes  ?Physical Activity: Insufficiently Active  ? Days of Exercise per Week: 7 days  ? Minutes of Exercise per Session: 20 min  ?Stress: Not on file  ?Social Connections: Moderately Isolated  ? Frequency of Communication with Friends and Family: Three times a week  ? Frequency of Social Gatherings with Friends and  Family: More than three times a week  ? Attends Religious Services: More than 4 times per year  ? Active Member of Clubs or Organizations: No  ? Attends Banker Meetings: Not on file  ? Marital Status: Never married  ? ? ?Tobacco Counseling ?Counseling given: Not Answered ? ? ?Clinical Intake: ? ?Pre-visit preparation completed: Yes ? ?Pain : 0-10 ?Pain Score: 8  (provided by pts mother) ?Pain Location: Knee ?Pain Orientation: Proximal, Left ?Pain Descriptors / Indicators: Aching ?Pain Onset: More than a month ago ?Pain Frequency: Occasional ? ?  ? ?Nutritional Risks: None ?Diabetes: No ? 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 ? ?Diabetic?no ? ?Interpreter Needed?: No ? ?Information entered by :: K[facility] ? ? ?Activities of Daily Living ? ?  12/04/2021  ?  1:59 PM  ?In your present state of health, do you have any difficulty performing the following activities:  ?Hearing? 0  ?Vision? 0  ?Difficulty concentrating or making decisions? 1  ?Comment pt's mom Alyssa Powell handles things for the patient.  ?Walking or climbing stairs? 1  ?Dressing or bathing? 1  ?Doing errands, shopping? 1  ? ? ?Patient Care Team: ?[name], MD as PCP - General (Family Medicine) ?Tillman Sers, DO (Inactive) ? ?Indicate any recent Medical Services you may have received from other than [facility] providers in the past year (date may be approximate). ? ?   ?Assessment:  ? This is a routine wellness examination for Alyssa Powell. ? ?Hearing/Vision screen ?No results found. ? ?Dietary issues and exercise activities discussed: ?  ? ? Goals Addressed   ? ?  ?  ?  ?  ? This Visit's Progress  ?  Weight (lb) < 200 lb (90.7 kg)     ?  Pt's mom states the patient needs to lose weight.  ?   ? ?  ? ?Depression Screen ? ?  12/04/2021  ?  1:52 PM 04/29/2021  ?  4:15 PM 11/06/2020  ? 10:08 AM 09/09/2020  ?  2:21 PM 11/14/2019  ?  2:11 PM 08/21/2019  ?  1:40 PM 10/12/2018  ?  2:58 PM  ?PHQ 2/9 Scores  ?PHQ - 2 Score  0  0 0  0  ?PHQ- 9 Score  0   0  0  ?Exception Documentation Patient refusal  Other- indicate reason in comment box   Medical reason   ?Not completed   unable to verbally speak for self      ?  ?  Fall Risk ? ?  12/04/2021  ?  2:00 PM 09/09/2020  ?  2:21 PM 11/14/2019  ?  2:14 PM 09/08/2017  ? 11:55 AM 06/13/2017  ?  1:58 PM  ?Fall Risk   ?Falls in the past year? 0 0 0 No No  ?Number falls in past yr: 0 0 0    ?Injury with Fall? 0 0 0    ?Risk for fall due to :  No Fall Risks     ?Follow up  Falls evaluation completed     ? ? ?FALL RISK PREVENTION PERTAINING TO THE HOME: ? ?Any stairs in or around the home? No  ?If so, are there any without handrails?  N/a ?Home free of loose throw rugs in walkways, pet beds, electrical cords, etc? Yes  ?Adequate lighting in your home to reduce risk of falls? Yes  ? ?ASSISTIVE DEVICES UTILIZED TO PREVENT FALLS: ? ?Life alert? No  ?Use of a cane, walker or w/c? No  ?Grab bars in the bathroom? No  ?Shower chair or bench in shower? No  ?Elevated toilet seat or a handicapped toilet? No  ? ?TIMED UP AND GO: ? ?Was the test performed? No .  ? ? ?Cognitive Function: ? ?  09/09/2020  ?  2:37 PM 10/12/2018  ?  2:58 PM  ?MMSE - Mini Mental State Exam  ?Not completed: Unable to complete   ?Orientation to time  2  ?Orientation to Place  3  ?Registration  2  ?Attention/ Calculation  0  ?Recall  0  ?Language- name 2 objects  2  ?Language- repeat  0  ?Language- follow 3 step command  3  ?Language- read & follow direction  0  ?Write a sentence  0  ?Copy design  0  ?Total score  12  ? ?  ?  ? ?Immunizations ?Immunization History  ?Administered Date(s) Administered  ? Influenza Whole 08/07/2009  ? Influenza,inj,Quad PF,6+ Mos 07/09/2013, 08/05/2014, 07/08/2015, 09/09/2020  ? Moderna  SARS-COV2 Booster Vaccination 10/29/2020, 03/30/2021  ? Moderna Sars-Covid-2 Vaccination 04/02/2020, 04/30/2020  ? Td 07/21/1994, 03/05/2009  ? Tdap 09/09/2020  ? ? ?TDAP status: Up to date ? ?Flu Vaccine statu

## 2021-12-08 NOTE — Telephone Encounter (Signed)
[facility] and [facility] to [facility] [employer] about the refill(s) Pantoprazole requested. Advised it was sent on 12/02/21 #90/0 refill(s). He says yes they have it, but 12/22/21 is the earliest that it can be refilled per insurance. The last pickup was 10/03/21 #90. Patient's mother called and advised. She says the medication was misplaced and the patient needs it. I advised the only way to receive it is to pay out of pocket, so [facility] will [facility], she verbalized understanding. ?

## 2021-12-08 NOTE — Telephone Encounter (Signed)
Pt's friend called in states med pantoprazole (PROTONIX) 40 MG tablet , hasnt been received at pharmacy as of yet. Please call back ?

## 2021-12-25 ENCOUNTER — Other Ambulatory Visit: Payer: Self-pay | Admitting: Internal Medicine

## 2021-12-25 ENCOUNTER — Other Ambulatory Visit: Payer: Self-pay | Admitting: Family Medicine

## 2021-12-25 ENCOUNTER — Ambulatory Visit: Payer: Self-pay

## 2021-12-25 DIAGNOSIS — J309 Allergic rhinitis, unspecified: Secondary | ICD-10-CM

## 2021-12-25 DIAGNOSIS — F325 Major depressive disorder, single episode, in full remission: Secondary | ICD-10-CM

## 2021-12-25 NOTE — Telephone Encounter (Signed)
Requested medication (s) are due for refill today: yes ? ?Requested medication (s) are on the active medication list: yes ? ?Last refill:  sertraline 10/02/21 #90/0, flonase 01/23/21 #48g/2 ? ?Future visit scheduled: No ? ?Notes to clinic:  Unable to refill per protocol, cannot delegate. Pt's mother called in and states pt been out of medications for several days and needing sent in asap.  ? ?  ?Requested Prescriptions  ?Pending Prescriptions Disp Refills  ? fluticasone (FLONASE) 50 MCG/ACT nasal spray [Pharmacy Med Name: FLUTICASONE PROP 50 MCG SPRAY] 48 mL 2  ?  Sig: SPRAY 2 SPRAYS INTO EACH NOSTRIL EVERY DAY  ?  ? Not Delegated - Ear, Nose, and Throat: Nasal Preparations - Corticosteroids Failed - 12/25/2021  1:23 PM  ?  ?  Failed - This refill cannot be delegated  ?  ?  Passed - Valid encounter within last 12 months  ?  Recent Outpatient Visits   ? ?      ? 8 months ago Essential hypertension  ? Primary Care at [facility], MD  ? 1 year ago Essential hypertension  ? Primary Care at [facility], Bayard Beaver, MD  ? 2 years ago Primary osteoarthritis of right knee  ? Primary Care at [facility], Bayard Beaver, MD  ? 2 years ago Moderate persistent asthma without complication  ? Primary Care at [facility], Dalbert Batman, MD  ? 2 years ago Viral URI with cough  ? Primary Care at [place], FNP  ? ?  ?  ? ?  ?  ?  ? sertraline (ZOLOFT) 50 MG tablet 90 tablet 0  ?  Sig: TAKE 1 AND 1/2 TABLETS BY MOUTH DAILY  ?  ? Not Delegated - Psychiatry:  Antidepressants - SSRI - sertraline Failed - 12/25/2021  1:23 PM  ?  ?  Failed - This refill cannot be delegated  ?  ?  Failed - AST in normal range and within 360 days  ?  AST  ?Date Value Ref Range Status  ?10/12/2018 17 0 - 40 IU/L Final  ?  ?  ?  ?  Failed - ALT in normal range and within 360 days  ?  ALT  ?Date Value Ref Range Status  ?10/12/2018 15 0 - 32 IU/L Final  ?  ?  ?  ?  Passed - Completed PHQ-2  or PHQ-9 in the last 360 days  ?  ?  Passed - Valid encounter within last 6 months  ?  Recent Outpatient Visits   ? ?      ? 8 months ago Essential hypertension  ? [facility] Care at [facility], MD  ? 1 year ago Essential hypertension  ? Primary Care at [facility], Bayard Beaver, MD  ? 2 years ago Primary osteoarthritis of right knee  ? Primary Care at [facility], Bayard Beaver, MD  ? 2 years ago Moderate persistent asthma without complication  ? Primary Care at [facility], Dalbert Batman, MD  ? 2 years ago Viral URI with cough  ? Primary Care at [facility], Carroll Sage, FNP  ? ?  ?  ? ?  ?  ?  ? ?

## 2021-12-25 NOTE — Telephone Encounter (Signed)
?  Chief Complaint: med refill ?Symptoms: NA ?Frequency: pt been out for several days ?Pertinent Negatives: NA ?Disposition: [] ED /[] Urgent Care (no appt availability in office) / [] Appointment(In office/virtual)/ []  [facility]/ [] Home Care/ [] Refused Recommended Disposition /[] [facility]/ [x]  Follow-up with PCP ?Additional Notes:  called and spoke with pt's mother. She states she is needing pt's med refilled asap because her grandson was in and able to go pick them up since she has difficulty with transportation. Advised Mary than I was unable to refill them per protocol so would send to Dr. stat so she could get them this evening before her grandson left to go back out of town.  ? ?Summary: medication question  ? Pts mother called in stating if she could speak with a nurse about some questions she had about the pt medications, please advise  ?  ? ?Reason for Disposition ? [1] Prescription refill request for ESSENTIAL medicine (i.e., likelihood of harm to patient if not taken) AND [2] triager unable to refill per department policy ? ?Answer Assessment - Initial Assessment Questions ?1. DRUG NAME: "What medicine do you need to have refilled?" ?    Sertraline and Flonase ?2. REFILLS REMAINING: "How many refills are remaining?" (Note: The label on the medicine or pill bottle will show how many refills are remaining. If there are no refills remaining, then a renewal may be needed.) ?    0 ?3. EXPIRATION DATE: "What is the expiration date?" (Note: The label states when the prescription will expire, and thus can no longer be refilled.) ?   NA ?4. PRESCRIBING HCP: "Who prescribed it?" Reason: If prescribed by specialist, call should be referred to that group. ?    Dr. ? ?Protocols used: Medication Refill and Renewal Call-A-AH ? ?

## 2021-12-25 NOTE — Telephone Encounter (Signed)
Duplicate request. Already sent to provider.

## 2021-12-26 ENCOUNTER — Other Ambulatory Visit: Payer: Self-pay | Admitting: Family Medicine

## 2021-12-26 DIAGNOSIS — F325 Major depressive disorder, single episode, in full remission: Secondary | ICD-10-CM

## 2021-12-29 ENCOUNTER — Other Ambulatory Visit: Payer: Self-pay | Admitting: Family Medicine

## 2021-12-29 DIAGNOSIS — F325 Major depressive disorder, single episode, in full remission: Secondary | ICD-10-CM

## 2021-12-29 NOTE — Telephone Encounter (Signed)
Pt's rx request is still pending. It was sent to provider on 12/25/21 but d/t office being closed for Good Friday resent on 12/28/21. Pt has been out of this medication so will route to practice for follow up for provider.  ?

## 2021-12-29 NOTE — Telephone Encounter (Unsigned)
Copied from CRM [id number]. Topic: General - Other ?>> Dec 29, 2021  1:28 PM Gaetana Michaelis A wrote: ?Reason for CRM: The patient's mother has made an additional call to check on the status of patient's previously requested sertraline (ZOLOFT) 50 MG tablet [[id number]]  ? ?Please contact further when available ?

## 2021-12-29 NOTE — Telephone Encounter (Signed)
Pt made an additional call regarding this Rx, please advise if short supply can be sent in, please advise.  ?

## 2021-12-30 NOTE — Telephone Encounter (Signed)
Ppt given to patient  ?

## 2021-12-30 NOTE — Telephone Encounter (Signed)
Requested medication (s) are due for refill today: yes ? ?Requested medication (s) are on the active medication list: yes ? ?Last refill:  11/01/21 ? ?Future visit scheduled: yes ? ?Notes to clinic:  Unable to refill per protocol, cannot delegate. ? ? ? ?  ?Requested Prescriptions  ?Pending Prescriptions Disp Refills  ? sertraline (ZOLOFT) 50 MG tablet 90 tablet 0  ?  Sig: TAKE 1 AND 1/2 TABLETS BY MOUTH DAILY  ?  ? Not Delegated - Psychiatry:  Antidepressants - SSRI - sertraline Failed - 12/29/2021  4:58 PM  ?  ?  Failed - This refill cannot be delegated  ?  ?  Failed - AST in normal range and within 360 days  ?  AST  ?Date Value Ref Range Status  ?10/12/2018 17 0 - 40 IU/L Final  ?  ?  ?  ?  Failed - ALT in normal range and within 360 days  ?  ALT  ?Date Value Ref Range Status  ?10/12/2018 15 0 - 32 IU/L Final  ?  ?  ?  ?  Passed - Completed PHQ-2 or PHQ-9 in the last 360 days  ?  ?  Passed - Valid encounter within last 6 months  ?  Recent Outpatient Visits   ? ?      ? 8 months ago Essential hypertension  ? Primary Care at [facility], MD  ? 1 year ago Essential hypertension  ? Primary Care at [facility], Kandee Keen, MD  ? 2 years ago Primary osteoarthritis of right knee  ? Primary Care at [facility], Kandee Keen, MD  ? 2 years ago Moderate persistent asthma without complication  ? Primary Care at [facility], Binnie Rail, MD  ? 2 years ago Viral URI with cough  ? Primary Care at [facility], Godfrey Pick, FNP  ? ?  ?  ?Future Appointments   ? ?        ? In 1 month [name], MD Primary Care at [facility]  ? ?  ? ?  ?  ?  ? ? ?

## 2022-01-01 ENCOUNTER — Telehealth: Payer: Medicare (Managed Care) | Admitting: Family Medicine

## 2022-01-01 ENCOUNTER — Other Ambulatory Visit: Payer: Self-pay

## 2022-01-01 DIAGNOSIS — F325 Major depressive disorder, single episode, in full remission: Secondary | ICD-10-CM

## 2022-01-01 MED ORDER — SERTRALINE HCL 50 MG PO TABS: ORAL_TABLET | ORAL | 0 refills | Status: DC

## 2022-01-01 NOTE — Progress Notes (Signed)
Per Dr. Andrey Campanile states that pt can have a refill on Zoloft until appt on 02/03/22, spoke w/pharmacy stated that Dx code was needed, Rx has been updated ?Contacted pt spoke w/mother [name]) advised med was sent in and make sure to keep 05/17 appt ?

## 2022-01-07 NOTE — Addendum Note (Signed)
Addended by: Mariam Dollar on: 01/07/2022 11:51 AM ? ? Modules accepted: Level of Service ? ?

## 2022-02-03 ENCOUNTER — Encounter: Payer: Self-pay | Admitting: Family Medicine

## 2022-02-03 ENCOUNTER — Ambulatory Visit (INDEPENDENT_AMBULATORY_CARE_PROVIDER_SITE_OTHER): Payer: Medicare (Managed Care) | Admitting: Family Medicine

## 2022-02-03 VITALS — BP 123/78 | HR 81 | Temp 98.1°F | Resp 16 | Ht 67.0 in | Wt 218.0 lb

## 2022-02-03 DIAGNOSIS — F325 Major depressive disorder, single episode, in full remission: Secondary | ICD-10-CM | POA: Diagnosis not present

## 2022-02-03 DIAGNOSIS — J4541 Moderate persistent asthma with (acute) exacerbation: Secondary | ICD-10-CM

## 2022-02-03 DIAGNOSIS — J309 Allergic rhinitis, unspecified: Secondary | ICD-10-CM

## 2022-02-03 DIAGNOSIS — I10 Essential (primary) hypertension: Secondary | ICD-10-CM | POA: Diagnosis not present

## 2022-02-03 DIAGNOSIS — K219 Gastro-esophageal reflux disease without esophagitis: Secondary | ICD-10-CM | POA: Diagnosis not present

## 2022-02-03 DIAGNOSIS — M1711 Unilateral primary osteoarthritis, right knee: Secondary | ICD-10-CM

## 2022-02-03 MED ORDER — HYDROCHLOROTHIAZIDE 25 MG PO TABS: 25.0000 mg | ORAL_TABLET | Freq: Every day | ORAL | 1 refills | Status: DC

## 2022-02-03 MED ORDER — MELOXICAM 7.5 MG PO TABS: 7.5000 mg | ORAL_TABLET | Freq: Every day | ORAL | 0 refills | Status: DC | PRN

## 2022-02-03 MED ORDER — AMLODIPINE BESYLATE 5 MG PO TABS: 5.0000 mg | ORAL_TABLET | Freq: Every day | ORAL | 1 refills | Status: DC

## 2022-02-03 MED ORDER — SERTRALINE HCL 50 MG PO TABS: ORAL_TABLET | ORAL | 1 refills | Status: DC

## 2022-02-03 MED ORDER — PANTOPRAZOLE SODIUM 40 MG PO TBEC: 40.0000 mg | DELAYED_RELEASE_TABLET | Freq: Every day | ORAL | 1 refills | Status: DC

## 2022-02-03 MED ORDER — PREDNISONE 50 MG PO TABS: 50.0000 mg | ORAL_TABLET | Freq: Every day | ORAL | 0 refills | Status: DC

## 2022-02-03 MED ORDER — CETIRIZINE HCL 10 MG PO TABS: ORAL_TABLET | ORAL | 1 refills | Status: AC

## 2022-02-03 MED ORDER — ALBUTEROL SULFATE HFA 108 (90 BASE) MCG/ACT IN AERS: INHALATION_SPRAY | RESPIRATORY_TRACT | 2 refills | Status: DC

## 2022-02-03 MED ORDER — FLUTICASONE PROPIONATE 50 MCG/ACT NA SUSP: 2.0000 | Freq: Every day | NASAL | 2 refills | Status: DC

## 2022-02-03 NOTE — Progress Notes (Signed)
Patient is here for 6 month follow up.

## 2022-02-04 ENCOUNTER — Encounter: Payer: Self-pay | Admitting: Family Medicine

## 2022-02-04 NOTE — Progress Notes (Signed)
Established Patient Office Visit  Subjective    Patient ID: Alyssa Powell, female    DOB: July 06, 1951  Age: 71 y.o. MRN: [id number]  CC:  Chief Complaint  Patient presents with   Medication Refill   Cough   Follow-up    6 month   Hypertension    HPI Alyssa Powell presents for routine follow up of chronic med issues. She does report increased asthma sx over the past week or so but denies fever/chills or viral sx.    Outpatient Encounter Medications as of 02/03/2022  Medication Sig   predniSONE (DELTASONE) 50 MG tablet Take 1 tablet (50 mg total) by mouth daily with breakfast.   albuterol (VENTOLIN HFA) 108 (90 Base) MCG/ACT inhaler INHALE 1-2 PUFFS BY MOUTH EVERY 6 HOURS AS NEEDED FOR WHEEZE OR SHORTNESS OF BREATH   amLODipine (NORVASC) 5 MG tablet Take 1 tablet (5 mg total) by mouth daily.   cetirizine (ZYRTEC) 10 MG tablet TAKE 1 TABLET BY MOUTH EVERYDAY AT BEDTIME   fluticasone (FLONASE) 50 MCG/ACT nasal spray Place 2 sprays into both nostrils daily.   fluticasone furoate-vilanterol (BREO ELLIPTA) 200-25 MCG/INH AEPB Inhale 1 puff into the lungs daily.   hydrochlorothiazide (HYDRODIURIL) 25 MG tablet Take 1 tablet (25 mg total) by mouth daily.   meloxicam (MOBIC) 7.5 MG tablet Take 1 tablet (7.5 mg total) by mouth daily as needed. for pain   pantoprazole (PROTONIX) 40 MG tablet Take 1 tablet (40 mg total) by mouth daily.   sertraline (ZOLOFT) 50 MG tablet TAKE 1 AND 1/2 TABLETS BY MOUTH DAILY   [DISCONTINUED] albuterol (VENTOLIN HFA) 108 (90 Base) MCG/ACT inhaler INHALE 1-2 PUFFS BY MOUTH EVERY 6 HOURS AS NEEDED FOR WHEEZE OR SHORTNESS OF BREATH   [DISCONTINUED] amLODipine (NORVASC) 5 MG tablet Take 1 tablet (5 mg total) by mouth daily.   [DISCONTINUED] cetirizine (ZYRTEC) 10 MG tablet TAKE 1 TABLET BY MOUTH EVERYDAY AT BEDTIME   [DISCONTINUED] fluticasone (FLONASE) 50 MCG/ACT nasal spray SPRAY 2 SPRAYS INTO EACH NOSTRIL EVERY DAY   [DISCONTINUED] hydrochlorothiazide  (HYDRODIURIL) 25 MG tablet Take 1 tablet (25 mg total) by mouth daily.   [DISCONTINUED] meloxicam (MOBIC) 7.5 MG tablet Take 1 tablet (7.5 mg total) by mouth daily as needed. for pain   [DISCONTINUED] pantoprazole (PROTONIX) 40 MG tablet TAKE 1 TABLET BY MOUTH EVERY DAY   [DISCONTINUED] sertraline (ZOLOFT) 50 MG tablet TAKE 1 AND 1/2 TABLETS BY MOUTH DAILY   No facility-administered encounter medications on file as of 02/03/2022.    Past Medical History:  Diagnosis Date   Allergy    Asthma    Hypertension    Mental retardation     Past Surgical History:  Procedure Laterality Date   HERNIA REPAIR      Family History  Problem Relation Age of Onset   Arthritis Mother    Asthma Father    Asthma Sister     Social History   Socioeconomic History   Marital status: Single    Spouse name: Not on file   Number of children: Not on file   Years of education: Not on file   Highest education level: Not on file  Occupational History   Not on file  Tobacco Use   Smoking status: Never   Smokeless tobacco: Never  Vaping Use   Vaping Use: Never used  Substance and Sexual Activity   Alcohol use: Not Currently   Drug use: Never   Sexual activity: Never  Other Topics Concern  Not on file  Social History Narrative   ** Merged History Encounter **       Social Determinants of Health   Financial Resource Strain: Low Risk    Difficulty of Paying Living Expenses: Not very hard  Food Insecurity: No Food Insecurity   Worried About Programme researcher, broadcasting/film/videounning Out of Food in the Last Year: Never true   Baristaan Out of Food in the Last Year: Never true  Transportation Needs: Personal assistantUnmet Transportation Needs   Lack of Transportation (Medical): Yes   Lack of Transportation (Non-Medical): Yes  Physical Activity: Insufficiently Active   Days of Exercise per Week: 7 days   Minutes of Exercise per Session: 20 min  Stress: Not on file  Social Connections: Moderately Isolated   Frequency of Communication with Friends  and Family: Three times a week   Frequency of Social Gatherings with Friends and Family: More than three times a week   Attends Religious Services: More than 4 times per year   Active Member of [employer] Financial[employer]ubs or Organizations: No   Attends Engineer, structuralClub or Organization Meetings: Not on file   Marital Status: Never married  Catering managerntimate Partner Violence: Not At Risk   Fear of Current or Ex-Partner: No   Emotionally Abused: No   Physically Abused: No   Sexually Abused: No    Review of Systems  Constitutional:  Negative for chills and fever.  Respiratory:  Positive for wheezing. Negative for cough and shortness of breath.   All other systems reviewed and are negative.      Objective    BP 123/78   Pulse 81   Temp 98.1 F (36.7 C) (Oral)   Resp 16   Ht 5\' 7"  (1.702 m)   Wt 218 lb (98.9 kg)   SpO2 94%   BMI 34.14 kg/m   Physical Exam Vitals and nursing note reviewed.  Constitutional:      General: She is not in acute distress.    Appearance: She is obese.  Cardiovascular:     Rate and Rhythm: Normal rate and regular rhythm.  Pulmonary:     Effort: Pulmonary effort is normal. No respiratory distress.     Breath sounds: Wheezing present.  Abdominal:     Palpations: Abdomen is soft.     Tenderness: There is no abdominal tenderness.  Neurological:     General: No focal deficit present.     Mental Status: She is alert. Mental status is at baseline.  Psychiatric:        Mood and Affect: Mood normal.        Behavior: Behavior is slowed.        Assessment & Plan:   1. Moderate persistent asthma with acute exacerbation Meds refilled. Prednisone prescribed.  - albuterol (VENTOLIN HFA) 108 (90 Base) MCG/ACT inhaler; INHALE 1-2 PUFFS BY MOUTH EVERY 6 HOURS AS NEEDED FOR WHEEZE OR SHORTNESS OF BREATH  Dispense: 18 each; Refill: 2  2. Essential hypertension Appears stable with present management. Continue and monitor. Meds refilled.  - hydrochlorothiazide (HYDRODIURIL) 25 MG tablet; Take 1  tablet (25 mg total) by mouth daily.  Dispense: 90 tablet; Refill: 1 - amLODipine (NORVASC) 5 MG tablet; Take 1 tablet (5 mg total) by mouth daily.  Dispense: 90 tablet; Refill: 1  3. Major depressive disorder with single episode, in full remission (HCC) Med refilled. Appears stable with present management. continue - sertraline (ZOLOFT) 50 MG tablet; TAKE 1 AND 1/2 TABLETS BY MOUTH DAILY  Dispense: 135 tablet; Refill: 1  4.  Gastroesophageal reflux disease without esophagitis Med refilled - pantoprazole (PROTONIX) 40 MG tablet; Take 1 tablet (40 mg total) by mouth daily.  Dispense: 90 tablet; Refill: 1  5. Primary osteoarthritis of right knee Med refilled - meloxicam (MOBIC) 7.5 MG tablet; Take 1 tablet (7.5 mg total) by mouth daily as needed. for pain  Dispense: 90 tablet; Refill: 0  6. Allergic rhinitis, unspecified seasonality, unspecified trigger Med refilled - fluticasone (FLONASE) 50 MCG/ACT nasal spray; Place 2 sprays into both nostrils daily.  Dispense: 48 mL; Refill: 2    No follow-ups on file.   Tommie Raymond, MD

## 2022-03-15 ENCOUNTER — Ambulatory Visit: Payer: Self-pay | Admitting: *Deleted

## 2022-03-15 NOTE — Telephone Encounter (Signed)
  Chief Complaint: shortness of breath and wheezing per patient's mother [name] on [place]. Requesting medication prednisone  Symptoms: cough, white colored phlegm, wheezing audible over phone  Frequency: 3-4 days  Pertinent Negatives: Patient denies chest pain difficulty breathing no fever Disposition: [x] ED /[] Urgent Care (no appt availability in office) / [] Appointment(In office/virtual)/ []  [facility]/ [] Home Care/ [x] Refused Recommended Disposition /[]  Mobile Bus/ []  Follow-up with PCP Additional Notes:   Recommended ED due to audible wheezing over phone. Patient's mother reports only wants prednisone and has tried inhalers and nebulizer's without relief. Due to transportation issues, pt mother declined ED. Recommended to call 911 and pt mother reports she only needs medication . Please advise . No appt available . FC Donna called and NT did not leave VM of refusal to go to ED. Sent teams message. Will send High priority.    Reason for Disposition  [1] MODERATE difficulty breathing (e.g., speaks in phrases, SOB even at rest, pulse 100-120) AND [2] NEW-onset or WORSE than normal  Answer Assessment - Initial Assessment Questions 1. RESPIRATORY STATUS: "Describe your breathing?" (e.g., wheezing, shortness of breath, unable to speak, severe coughing)      Shortness of breath, wheezing audible over phone coughing up white phleggm 2. ONSET: "When did this breathing problem begin?"      3-4 days ago  3. PATTERN "Does the difficult breathing come and go, or has it been constant since it started?"      Na  4. SEVERITY: "How bad is your breathing?" (e.g., mild, moderate, severe)    - MILD: No SOB at rest, mild SOB with walking, speaks normally in sentences, can lie down, no retractions, pulse < 100.    - MODERATE: SOB at rest, SOB with minimal exertion and prefers to sit, cannot lie down flat, speaks in phrases, mild retractions, audible wheezing, pulse 100-120.    - SEVERE: Very  SOB at rest, speaks in single words, struggling to breathe, sitting hunched forward, retractions, pulse > 120      Wheezing noted  5. RECURRENT SYMPTOM: "Have you had difficulty breathing before?" If Yes, ask: "When was the last time?" and "What happened that time?"      Yes  6. CARDIAC HISTORY: "Do you have any history of heart disease?" (e.g., heart attack, angina, bypass surgery, angioplasty)      See hx  7. LUNG HISTORY: "Do you have any history of lung disease?"  (e.g., pulmonary embolus, asthma, emphysema)     See hx 8. CAUSE: "What do you think is causing the breathing problem?"      Not sure  9. OTHER SYMPTOMS: "Do you have any other symptoms? (e.g., dizziness, runny nose, cough, chest pain, fever)     Cough, wheezing  10. O2 SATURATION MONITOR:  "Do you use an oxygen saturation monitor (pulse oximeter) at home?" If Yes, "What is your reading (oxygen level) today?" "What is your usual oxygen saturation reading?" (e.g., 95%)       na 11. PREGNANCY: "Is there any chance you are pregnant?" "When was your last menstrual period?"       na 12. TRAVEL: "Have you traveled out of the country in the last month?" (e.g., travel history, exposures)       na  Protocols used: Breathing Difficulty-A-AH

## 2022-03-16 ENCOUNTER — Telehealth (INDEPENDENT_AMBULATORY_CARE_PROVIDER_SITE_OTHER): Payer: Medicare (Managed Care) | Admitting: Family Medicine

## 2022-03-16 DIAGNOSIS — J029 Acute pharyngitis, unspecified: Secondary | ICD-10-CM

## 2022-03-16 MED ORDER — AZITHROMYCIN 250 MG PO TABS: ORAL_TABLET | ORAL | 0 refills | Status: AC

## 2022-03-16 NOTE — Progress Notes (Signed)
Virtual Visit via Telephone Note  I connected with Mariselda Badalamenti on 03/16/22 at 10:40 AM EDT by telephone and verified that I am speaking with the correct person using two identifiers.  Location: Patient: [place] home Provider: Office   I discussed the limitations, risks, security and privacy concerns of performing an evaluation and management service by telephone and the availability of in person appointments. I also discussed with the patient that there may be a patient responsible charge related to this service. The patient expressed understanding and agreed to proceed.   History of Present Illness:  Patient is at home and  complains of sore throat. Unable to get to the office 2/2 transportation Denies fever/chills or viral sx.    Observations/Objective:   Assessment and Plan: 1. Sore throat Zithromax prescribed. Tylenol/nsaids prn.    Follow Up Instructions: To ED/ff if sx persist or worsen.    I discussed the assessment and treatment plan with the patient. The patient was provided an opportunity to ask questions and all were answered. The patient agreed with the plan and demonstrated an understanding of the instructions.   The patient was advised to call back or seek an in-person evaluation if the symptoms worsen or if the condition fails to improve as anticipated.  I provided 12 minutes of non-face-to-face time during this encounter.   Tommie Raymond, MD

## 2022-03-16 NOTE — Progress Notes (Signed)
Patient has had cough and congestion. Patient was SOB at times. Patient would like a ABX

## 2022-03-16 NOTE — Telephone Encounter (Signed)
Patient given appt 

## 2022-04-13 ENCOUNTER — Other Ambulatory Visit: Payer: Self-pay | Admitting: Family

## 2022-04-13 DIAGNOSIS — J4541 Moderate persistent asthma with (acute) exacerbation: Secondary | ICD-10-CM

## 2022-04-13 NOTE — Telephone Encounter (Signed)
Requested medication (s) are due for refill today - expired Rx  Requested medication (s) are on the active medication list -yes  Future visit scheduled -yes  Last refill: 04/07/21 #60 6RF  Notes to clinic: expired Rx, no protocol listed for medication- sent for provider review   Requested Prescriptions  Pending Prescriptions Disp Refills   BREO ELLIPTA 200-25 MCG/ACT AEPB [Pharmacy Med Name: BREO ELLIPTA 200-25 MCG INH] 60 each 6    Sig: TAKE 1 PUFF BY MOUTH EVERY DAY     There is no refill protocol information for this order       Requested Prescriptions  Pending Prescriptions Disp Refills   BREO ELLIPTA 200-25 MCG/ACT AEPB [Pharmacy Med Name: BREO ELLIPTA 200-25 MCG INH] 60 each 6    Sig: TAKE 1 PUFF BY MOUTH EVERY DAY     There is no refill protocol information for this order

## 2022-05-03 ENCOUNTER — Other Ambulatory Visit: Payer: Self-pay | Admitting: Family Medicine

## 2022-05-03 DIAGNOSIS — M1711 Unilateral primary osteoarthritis, right knee: Secondary | ICD-10-CM

## 2022-05-03 NOTE — Telephone Encounter (Signed)
Requested medication (s) are due for refill today: yes  Requested medication (s) are on the active medication list: yes  Last refill:  02/03/22 #90 with 0 RF  Future visit scheduled: 08/04/22, seen 02/03/22  Notes to clinic:  Failed protocol of labs within 12 months, labs are from 2020, has upcoming appt, please assess.       Requested Prescriptions  Pending Prescriptions Disp Refills   meloxicam (MOBIC) 7.5 MG tablet [Pharmacy Med Name: MELOXICAM 7.5 MG TABLET] 90 tablet 0    Sig: TAKE 1 TABLET (7.5 MG TOTAL) BY MOUTH DAILY AS NEEDED. FOR PAIN     Analgesics:  COX2 Inhibitors Failed - 05/03/2022  7:40 AM      Failed - Manual Review: Labs are only required if the patient has taken medication for more than 8 weeks.      Failed - HGB in normal range and within 360 days    Hemoglobin  Date Value Ref Range Status  10/12/2018 13.8 11.1 - 15.9 g/dL Final         Failed - Cr in normal range and within 360 days    Creat  Date Value Ref Range Status  10/19/2013 1.02 0.50 - 1.10 mg/dL Final   Creatinine, Ser  Date Value Ref Range Status  10/12/2018 0.97 0.57 - 1.00 mg/dL Final         Failed - HCT in normal range and within 360 days    Hematocrit  Date Value Ref Range Status  10/12/2018 40.6 34.0 - 46.6 % Final         Failed - AST in normal range and within 360 days    AST  Date Value Ref Range Status  10/12/2018 17 0 - 40 IU/L Final         Failed - ALT in normal range and within 360 days    ALT  Date Value Ref Range Status  10/12/2018 15 0 - 32 IU/L Final         Failed - eGFR is 30 or above and within 360 days    GFR, Est African American  Date Value Ref Range Status  12/11/2010 >60 >60 mL/min Final   GFR calc Af Amer  Date Value Ref Range Status  10/12/2018 69 >59 mL/min/1.73 Final   GFR, Est Non African American  Date Value Ref Range Status  12/11/2010 55 (L) >60 mL/min Final   GFR calc non Af Amer  Date Value Ref Range Status  10/12/2018 60 >59  mL/min/1.73 Final         Passed - Patient is not pregnant      Passed - Valid encounter within last 12 months    Recent Outpatient Visits           1 month ago Sore throat   Primary Care at [facility], Clyde Canterbury, MD   2 months ago Moderate persistent asthma with acute exacerbation   Primary Care at [facility], MD   1 year ago Essential hypertension   Primary Care at [facility], MD   1 year ago Essential hypertension   Primary Care at [facility], Bayard Beaver, MD   2 years ago Primary osteoarthritis of right knee   Primary Care at [facility], Bayard Beaver, MD       Future Appointments             In 1 month Ronnald Ramp Arvid Right, MD [place] at [facility]  In 3 months Dorna Mai, MD Primary Care at [facility]

## 2022-06-28 ENCOUNTER — Encounter: Payer: Self-pay | Admitting: Internal Medicine

## 2022-06-28 ENCOUNTER — Ambulatory Visit (INDEPENDENT_AMBULATORY_CARE_PROVIDER_SITE_OTHER): Payer: Medicare (Managed Care) | Admitting: Internal Medicine

## 2022-06-28 VITALS — BP 128/76 | HR 72 | Temp 98.2°F | Ht 67.0 in | Wt 220.0 lb

## 2022-06-28 DIAGNOSIS — Z0001 Encounter for general adult medical examination with abnormal findings: Secondary | ICD-10-CM | POA: Diagnosis not present

## 2022-06-28 DIAGNOSIS — Z6834 Body mass index (BMI) 34.0-34.9, adult: Secondary | ICD-10-CM | POA: Diagnosis not present

## 2022-06-28 DIAGNOSIS — Z1231 Encounter for screening mammogram for malignant neoplasm of breast: Secondary | ICD-10-CM | POA: Insufficient documentation

## 2022-06-28 DIAGNOSIS — Z23 Encounter for immunization: Secondary | ICD-10-CM

## 2022-06-28 DIAGNOSIS — J4541 Moderate persistent asthma with (acute) exacerbation: Secondary | ICD-10-CM

## 2022-06-28 DIAGNOSIS — M1A09X0 Idiopathic chronic gout, multiple sites, without tophus (tophi): Secondary | ICD-10-CM

## 2022-06-28 DIAGNOSIS — E785 Hyperlipidemia, unspecified: Secondary | ICD-10-CM | POA: Insufficient documentation

## 2022-06-28 DIAGNOSIS — Z1211 Encounter for screening for malignant neoplasm of colon: Secondary | ICD-10-CM | POA: Insufficient documentation

## 2022-06-28 DIAGNOSIS — E6609 Other obesity due to excess calories: Secondary | ICD-10-CM | POA: Diagnosis not present

## 2022-06-28 DIAGNOSIS — I10 Essential (primary) hypertension: Secondary | ICD-10-CM | POA: Diagnosis not present

## 2022-06-28 LAB — HEPATIC FUNCTION PANEL
ALT: 17 U/L (ref 0–35)
AST: 19 U/L (ref 0–37)
Albumin: 4.1 g/dL (ref 3.5–5.2)
Alkaline Phosphatase: 52 U/L (ref 39–117)
Bilirubin, Direct: 0.1 mg/dL (ref 0.0–0.3)
Total Bilirubin: 0.5 mg/dL (ref 0.2–1.2)
Total Protein: 7.7 g/dL (ref 6.0–8.3)

## 2022-06-28 LAB — CBC WITH DIFFERENTIAL/PLATELET
Basophils Absolute: 0 10*3/uL (ref 0.0–0.1)
Basophils Relative: 0.5 % (ref 0.0–3.0)
Eosinophils Absolute: 0.2 10*3/uL (ref 0.0–0.7)
Eosinophils Relative: 3.4 % (ref 0.0–5.0)
HCT: 41.5 % (ref 36.0–46.0)
Hemoglobin: 13.9 g/dL (ref 12.0–15.0)
Lymphocytes Relative: 35.8 % (ref 12.0–46.0)
Lymphs Abs: 1.9 10*3/uL (ref 0.7–4.0)
MCHC: 33.6 g/dL (ref 30.0–36.0)
MCV: 85.8 fl (ref 78.0–100.0)
Monocytes Absolute: 0.5 10*3/uL (ref 0.1–1.0)
Monocytes Relative: 9.7 % (ref 3.0–12.0)
Neutro Abs: 2.7 10*3/uL (ref 1.4–7.7)
Neutrophils Relative %: 50.6 % (ref 43.0–77.0)
Platelets: 291 10*3/uL (ref 150.0–400.0)
RBC: 4.83 Mil/uL (ref 3.87–5.11)
RDW: 13.5 % (ref 11.5–15.5)
WBC: 5.3 10*3/uL (ref 4.0–10.5)

## 2022-06-28 LAB — BASIC METABOLIC PANEL
BUN: 15 mg/dL (ref 6–23)
CO2: 31 mEq/L (ref 19–32)
Calcium: 9.3 mg/dL (ref 8.4–10.5)
Chloride: 103 mEq/L (ref 96–112)
Creatinine, Ser: 0.87 mg/dL (ref 0.40–1.20)
GFR: 66.33 mL/min (ref >60.00)
Glucose, Bld: 84 mg/dL (ref 70–99)
Potassium: 3.4 mEq/L — ABNORMAL LOW (ref 3.5–5.1)
Sodium: 141 mEq/L (ref 135–145)

## 2022-06-28 LAB — URINALYSIS, ROUTINE W REFLEX MICROSCOPIC
Ketones, ur: NEGATIVE
Leukocytes,Ua: NEGATIVE
Nitrite: NEGATIVE
RBC / HPF: NONE SEEN (ref 0–?)
Specific Gravity, Urine: >=1.03 — AB (ref 1.000–1.030)
Urine Glucose: NEGATIVE
Urobilinogen, UA: 1 (ref 0.0–1.0)
pH: 5.5 (ref 5.0–8.0)

## 2022-06-28 LAB — URIC ACID: Uric Acid, Serum: 5 mg/dL (ref 2.4–7.0)

## 2022-06-28 LAB — HEMOGLOBIN A1C: Hgb A1c MFr Bld: 6.1 % (ref 4.6–6.5)

## 2022-06-28 LAB — TSH: TSH: 1.02 u[IU]/mL (ref 0.35–5.50)

## 2022-06-28 MED ORDER — LEVALBUTEROL HCL 1.25 MG/3ML IN NEBU: 1.2500 mg | INHALATION_SOLUTION | RESPIRATORY_TRACT | 5 refills | Status: DC | PRN

## 2022-06-28 MED ORDER — BUDESONIDE 0.5 MG/2ML IN SUSP: 0.5000 mg | Freq: Two times a day (BID) | RESPIRATORY_TRACT | 1 refills | Status: DC

## 2022-06-28 NOTE — Patient Instructions (Signed)

## 2022-06-28 NOTE — Progress Notes (Unsigned)
Subjective:  Patient ID: Alyssa Powell, female    DOB: June 09, 1951  Age: 71 y.o. MRN: [id number]  CC: No chief complaint on file.   HPI Alyssa Powell presents for ***  History [name] has a past medical history of Allergy, Asthma, Hypertension, and Mental retardation.   She has a past surgical history that includes Hernia repair.   Her family history includes Arthritis in her mother; Asthma in her father and sister.She reports that she has never smoked. She has never used smokeless tobacco. She reports that she does not currently use alcohol. She reports that she does not use drugs.  Outpatient Medications Prior to Visit  Medication Sig Dispense Refill   albuterol (VENTOLIN HFA) 108 (90 Base) MCG/ACT inhaler INHALE 1-2 PUFFS BY MOUTH EVERY 6 HOURS AS NEEDED FOR WHEEZE OR SHORTNESS OF BREATH 18 each 2   cetirizine (ZYRTEC) 10 MG tablet TAKE 1 TABLET BY MOUTH EVERYDAY AT BEDTIME 90 tablet 1   fluticasone (FLONASE) 50 MCG/ACT nasal spray Place 2 sprays into both nostrils daily. 48 mL 2   meloxicam (MOBIC) 7.5 MG tablet TAKE 1 TABLET (7.5 MG TOTAL) BY MOUTH DAILY AS NEEDED. FOR PAIN 90 tablet 0   pantoprazole (PROTONIX) 40 MG tablet Take 1 tablet (40 mg total) by mouth daily. 90 tablet 1   sertraline (ZOLOFT) 50 MG tablet TAKE 1 AND 1/2 TABLETS BY MOUTH DAILY 135 tablet 1   amLODipine (NORVASC) 5 MG tablet Take 1 tablet (5 mg total) by mouth daily. 90 tablet 1   BREO ELLIPTA 200-25 MCG/ACT AEPB TAKE 1 PUFF BY MOUTH EVERY DAY 60 each 6   hydrochlorothiazide (HYDRODIURIL) 25 MG tablet Take 1 tablet (25 mg total) by mouth daily. 90 tablet 1   predniSONE (DELTASONE) 50 MG tablet Take 1 tablet (50 mg total) by mouth daily with breakfast. 5 tablet 0   No facility-administered medications prior to visit.    ROS Review of Systems  Objective:  BP 128/76 (BP Location: Left Arm, Patient Position: Sitting, Cuff Size: Large)   Pulse 72   Temp 98.2 F (36.8 C) (Oral)   Ht '5\' 7"'  (1.702 m)   Wt  220 lb (99.8 kg)   SpO2 94%   BMI 34.46 kg/m   Physical Exam Cardiovascular:     Rate and Rhythm: Normal rate and regular rhythm.     Heart sounds: Normal heart sounds, S1 normal and S2 normal.     No friction rub. No gallop.     Comments: EKG- NSR, 64 bpm No LVH or Q waves Normal EKG    Lab Results  Component Value Date   WBC 5.3 06/28/2022   HGB 13.9 06/28/2022   HCT 41.5 06/28/2022   PLT 291.0 06/28/2022   GLUCOSE 84 06/28/2022   CHOL 182 10/12/2018   TRIG 66 10/12/2018   HDL 72 10/12/2018   LDLCALC 97 10/12/2018   ALT 17 06/28/2022   AST 19 06/28/2022   NA 141 06/28/2022   K 3.4 (L) 06/28/2022   CL 103 06/28/2022   CREATININE 0.87 06/28/2022   BUN 15 06/28/2022   CO2 31 06/28/2022   TSH 1.02 06/28/2022   HGBA1C 6.1 06/28/2022     Assessment & Plan:   Diagnoses and all orders for this visit:  Flu vaccine need -     Flu Vaccine QUAD High Dose(Fluad)  Essential hypertension, benign -     Basic metabolic panel; Future -     CBC with Differential/Platelet; Future -  TSH; Future -     Urinalysis, Routine w reflex microscopic; Future -     Hepatic function panel; Future -     EKG 12-Lead -     Hepatic function panel -     Urinalysis, Routine w reflex microscopic -     TSH -     CBC with Differential/Platelet -     Basic metabolic panel  Moderate persistent asthma with acute exacerbation -     budesonide (PULMICORT) 0.5 MG/2ML nebulizer solution; Take 2 mLs (0.5 mg total) by nebulization 2 (two) times daily. -     levalbuterol (XOPENEX) 1.25 MG/3ML nebulizer solution; Take 1.25 mg by nebulization every 4 (four) hours as needed for wheezing.  Class 1 obesity due to excess calories with serious comorbidity and body mass index (BMI) of 34.0 to 34.9 in adult -     Hemoglobin A1c; Future -     Hemoglobin A1c  Screen for colon cancer -     Ambulatory referral to Gastroenterology  Visit for screening mammogram -     MM DIGITAL SCREENING BILATERAL;  Future  Idiopathic chronic gout of multiple sites without tophus -     HLA-B*58:01 Typing; Future -     Uric acid; Future -     Uric acid -     HLA-B*58:01 Typing  Hyperlipidemia with target LDL less than 130 -     TSH; Future -     Hepatic function panel; Future -     Hepatic function panel -     TSH  Need for vaccination -     Pneumococcal conjugate vaccine 20-valent (Prevnar 20)   I have discontinued Ermine Donahoe's hydrochlorothiazide, amLODipine, predniSONE, and Breo Ellipta. I am also having her start on budesonide and levalbuterol. Additionally, I am having her maintain her sertraline, pantoprazole, cetirizine, fluticasone, albuterol, and meloxicam.  Meds ordered this encounter  Medications   budesonide (PULMICORT) 0.5 MG/2ML nebulizer solution    Sig: Take 2 mLs (0.5 mg total) by nebulization 2 (two) times daily.    Dispense:  360 mL    Refill:  1   levalbuterol (XOPENEX) 1.25 MG/3ML nebulizer solution    Sig: Take 1.25 mg by nebulization every 4 (four) hours as needed for wheezing.    Dispense:  72 mL    Refill:  5     Follow-up: Return in about 6 months (around 12/28/2022).  Scarlette Calico, MD

## 2022-06-29 ENCOUNTER — Encounter: Payer: Self-pay | Admitting: Internal Medicine

## 2022-07-02 LAB — HLA-B*58:01 TYPING: HLA-B*58:01 Typing: NEGATIVE

## 2022-07-04 ENCOUNTER — Encounter: Payer: Self-pay | Admitting: Internal Medicine

## 2022-07-04 DIAGNOSIS — Z23 Encounter for immunization: Secondary | ICD-10-CM | POA: Insufficient documentation

## 2022-07-04 DIAGNOSIS — Z0001 Encounter for general adult medical examination with abnormal findings: Secondary | ICD-10-CM | POA: Insufficient documentation

## 2022-07-04 NOTE — Assessment & Plan Note (Signed)
Exam completed Labs reviewed Vaccines reviewed and updated Cancer screenings addressed Patient education was given. 

## 2022-07-05 ENCOUNTER — Telehealth: Payer: Self-pay

## 2022-07-05 NOTE — Telephone Encounter (Signed)
Nurse Assessment Nurse: Velta Addison, RN, Helene Kelp Date/Time Eilene Ghazi Time): 07/03/2022 12:47:10 PM Confirm and document reason for call. If symptomatic, describe symptoms. ---Caller states that sister is wheezing and uses a nebulizer. Reports that she has difficulty using her inhaler. Uses nebulizer once a day. Reports that she has been out of the medication for nebulizer for 1 week. Does the patient have any new or worsening symptoms? ---Yes Will a triage be completed? ---Yes Related visit to physician within the last 2 weeks? ---Yes Does the PT have any chronic conditions? (i.e. diabetes, asthma, this includes High risk factors for pregnancy, etc.) ---Yes List chronic conditions. ---pre-diabetes, asthma Is this a behavioral health or substance abuse call? ---No Guidelines Guideline Title Affirmed Question Affirmed Notes Nurse Date/Time (Eastern Time) Asthma Attack  [1] MILD asthma attack (e.g., no SOB at rest, mild SOB with walking, speaks normally in sentences, mild wheezing)  [2]asting > 24 hours on prescribed treatment Disp. Time Eilene Ghazi Time) Disposition Final User 07/03/2022 12:45:25 PM Send to Urgent Queue Windy Canny 07/03/2022 12:59:38 PM Call PCP Now Yes Velta Addison, RN, Helene Kelp 07/03/2022 1:03:31 PM Called On-Call Provider Velta Addison, RN, Helene Kelp Final Disposition 07/03/2022 12:59:38 PM Call PCP Now Yes Velta Addison, RN, Helene Kelp Disposition Overriden: See PCP within 24 Hours Override Reason: Patient's symptoms need a higher level of care Caller Disagree/Comply Comply Caller Understands Yes PreDisposition Did not know what to do Care Advice Given Per Guideline CALL PCP NOW: * I'll page the on-call provider now. If you haven't heard from the provider (or me) within 30 minutes, call again. Comments User: Alyssa Collet, RN Date/Time Eilene Ghazi Time): 07/03/2022 12:59:33 PM Medication is albuterol [facility] on [address] [phone]  [place], Betty - [place] [phone] 07/03/2022 1:03:31 PM Called  On Call Provider - Reached Doctor Paged [place], Betty - MD 07/03/2022 1:04:18 PM Spoke with On Call - General Message Result Advised that provider ordered xopenex and it was sent [facility]

## 2022-07-06 ENCOUNTER — Other Ambulatory Visit: Payer: Self-pay | Admitting: Internal Medicine

## 2022-07-06 DIAGNOSIS — J4541 Moderate persistent asthma with (acute) exacerbation: Secondary | ICD-10-CM

## 2022-07-06 MED ORDER — LEVALBUTEROL HCL 1.25 MG/3ML IN NEBU: 1.2500 mg | INHALATION_SOLUTION | RESPIRATORY_TRACT | 5 refills | Status: DC | PRN

## 2022-07-06 MED ORDER — ALBUTEROL SULFATE HFA 108 (90 BASE) MCG/ACT IN AERS: INHALATION_SPRAY | RESPIRATORY_TRACT | 2 refills | Status: DC

## 2022-07-06 NOTE — Telephone Encounter (Addendum)
Patients sister Ilda Foil is calling in about message below. Needs the prescription sent to [id number] - [place]. Also states Kaylin has another medication but cant remember the name of it that also needs to be [facility] to [facility], asked sister if she knew the name or what the medication did. She said it wasn't her job to know which medications were sent in that we should have a record of it.

## 2022-07-08 ENCOUNTER — Telehealth: Payer: Self-pay

## 2022-07-08 NOTE — Telephone Encounter (Signed)
Pt's mother, [name], stated that pt has a history of gout and is currently having a gout flare up in the feet. Please advise.

## 2022-07-08 NOTE — Telephone Encounter (Signed)
Patient's mom is calling stating that Alyssa Powell was diagnosed with Gout. Wants to know if there is something that can be called in. They have no transportation to come to an appointment.

## 2022-07-09 ENCOUNTER — Other Ambulatory Visit: Payer: Self-pay | Admitting: Internal Medicine

## 2022-07-09 DIAGNOSIS — M1A09X0 Idiopathic chronic gout, multiple sites, without tophus (tophi): Secondary | ICD-10-CM

## 2022-07-09 MED ORDER — COLCHICINE 0.6 MG PO TABS: 0.6000 mg | ORAL_TABLET | Freq: Two times a day (BID) | ORAL | 0 refills | Status: DC

## 2022-07-27 ENCOUNTER — Other Ambulatory Visit: Payer: Self-pay | Admitting: Internal Medicine

## 2022-07-27 DIAGNOSIS — M1A09X0 Idiopathic chronic gout, multiple sites, without tophus (tophi): Secondary | ICD-10-CM

## 2022-08-04 ENCOUNTER — Ambulatory Visit: Payer: Medicare (Managed Care) | Admitting: Family Medicine

## 2022-08-04 ENCOUNTER — Telehealth: Payer: Self-pay | Admitting: Internal Medicine

## 2022-08-04 NOTE — Telephone Encounter (Signed)
For our records:  We have received Medical Necessity PW for the pt and it has been placed in Dr. Jones box.   Upon completion please fax to: [phone] 

## 2022-08-05 NOTE — Telephone Encounter (Signed)
This was completed and faxed back on 11/9

## 2022-08-27 ENCOUNTER — Ambulatory Visit: Payer: Medicare (Managed Care)

## 2022-09-12 ENCOUNTER — Other Ambulatory Visit: Payer: Self-pay | Admitting: Family Medicine

## 2022-09-12 DIAGNOSIS — K219 Gastro-esophageal reflux disease without esophagitis: Secondary | ICD-10-CM

## 2022-09-16 ENCOUNTER — Encounter: Payer: Self-pay | Admitting: Family Medicine

## 2022-09-16 ENCOUNTER — Telehealth (INDEPENDENT_AMBULATORY_CARE_PROVIDER_SITE_OTHER): Payer: Medicare (Managed Care) | Admitting: Family Medicine

## 2022-09-16 DIAGNOSIS — J4541 Moderate persistent asthma with (acute) exacerbation: Secondary | ICD-10-CM

## 2022-09-16 DIAGNOSIS — R058 Other specified cough: Secondary | ICD-10-CM | POA: Diagnosis not present

## 2022-09-16 MED ORDER — DOXYCYCLINE HYCLATE 100 MG PO TABS: 100.0000 mg | ORAL_TABLET | Freq: Two times a day (BID) | ORAL | 0 refills | Status: DC

## 2022-09-16 MED ORDER — PREDNISONE 20 MG PO TABS: 40.0000 mg | ORAL_TABLET | Freq: Every day | ORAL | 0 refills | Status: DC

## 2022-09-16 NOTE — Progress Notes (Signed)
MyChart Video Visit    Virtual Visit via Video Note   This visit type was conducted due to national recommendations for restrictions regarding the COVID-19 Pandemic (e.g. social distancing) in an effort to limit this patient's exposure and mitigate transmission in our community. This patient is at least at moderate risk for complications without adequate follow up. This format is felt to be most appropriate for this patient at this time. Physical exam was limited by quality of the video and audio technology used for the visit. CMA was able to get the patient set up on a video visit.  Patient location: Home. Patient and provider in visit Provider location: Office  I discussed the limitations of evaluation and management by telemedicine and the availability of in person appointments. The patient expressed understanding and agreed to proceed.  Visit Date: 09/16/2022  Today's healthcare provider: Hetty Blend, NP-C     Subjective:    Patient ID: Alyssa Powell, female    DOB: 21-Feb-1951, 71 y.o.   MRN: [id number]  Chief Complaint  Patient presents with   Cough   Wheezing    HPI Keia's sister is Gunnar Fusi and mother are with her during the visit.   Co worsening productive cough with yellow sputum and wheezing x 8-9 days.   She is able to perform her normal activities and is eating and drinking fine.  Having mild shortness of breath,  Using nebulizer as recommended.   No recent antibiotics or steroids.   No fever, chills, body aches, dizziness, chest pain, N/V/D    Past Medical History:  Diagnosis Date   Allergy    Asthma    Hypertension    Mental retardation     Past Surgical History:  Procedure Laterality Date   HERNIA REPAIR      Family History  Problem Relation Age of Onset   Arthritis Mother    Asthma Father    Alcoholism Father    Asthma Sister    Diabetes Brother    Alcoholism Brother     Social History   Socioeconomic History   Marital status:  Single    Spouse name: Not on file   Number of children: Not on file   Years of education: Not on file   Highest education level: Not on file  Occupational History   Not on file  Tobacco Use   Smoking status: Never   Smokeless tobacco: Never  Vaping Use   Vaping Use: Never used  Substance and Sexual Activity   Alcohol use: Not Currently   Drug use: Never   Sexual activity: Never  Other Topics Concern   Not on file  Social History Narrative   ** Merged History Encounter **       Social Determinants of Health   Financial Resource Strain: Low Risk  (12/04/2021)   Overall Financial Resource Strain (CARDIA)    Difficulty of Paying Living Expenses: Not very hard  Food Insecurity: No Food Insecurity (12/04/2021)   Hunger Vital Sign    Worried About Running Out of Food in the Last Year: Never true    Ran Out of Food in the Last Year: Never true  Transportation Needs: Unmet Transportation Needs (12/04/2021)   PRAPARE - Transportation    Lack of Transportation (Medical): Yes    Lack of Transportation (Non-Medical): Yes  Physical Activity: Insufficiently Active (12/04/2021)   Exercise Vital Sign    Days of Exercise per Week: 7 days    Minutes of Exercise per Session:  20 min  Stress: Not on file  Social Connections: Moderately Isolated (12/04/2021)   Social Connection and Isolation Panel [NHANES]    Frequency of Communication with Friends and Family: Three times a week    Frequency of Social Gatherings with Friends and Family: More than three times a week    Attends Religious Services: More than 4 times per year    Active Member of [employer] or Organizations: No    Attends Banker Meetings: Not on file    Marital Status: Never married  Intimate Partner Violence: Not At Risk (12/04/2021)   Humiliation, Afraid, Rape, and Kick questionnaire    Fear of Current or Ex-Partner: No    Emotionally Abused: No    Physically Abused: No    Sexually Abused: No    Outpatient  Medications Prior to Visit  Medication Sig Dispense Refill   albuterol (VENTOLIN HFA) 108 (90 Base) MCG/ACT inhaler INHALE 1-2 PUFFS BY MOUTH EVERY 6 HOURS AS NEEDED FOR WHEEZE OR SHORTNESS OF BREATH 18 each 2   budesonide (PULMICORT) 0.5 MG/2ML nebulizer solution Take 2 mLs (0.5 mg total) by nebulization 2 (two) times daily. 360 mL 1   cetirizine (ZYRTEC) 10 MG tablet TAKE 1 TABLET BY MOUTH EVERYDAY AT BEDTIME 90 tablet 1   colchicine 0.6 MG tablet TAKE 1 TABLET BY MOUTH 2 TIMES DAILY. 180 tablet 0   fluticasone (FLONASE) 50 MCG/ACT nasal spray Place 2 sprays into both nostrils daily. 48 mL 2   levalbuterol (XOPENEX) 1.25 MG/3ML nebulizer solution Take 1.25 mg by nebulization every 4 (four) hours as needed for wheezing. 72 mL 5   meloxicam (MOBIC) 7.5 MG tablet TAKE 1 TABLET (7.5 MG TOTAL) BY MOUTH DAILY AS NEEDED. FOR PAIN 90 tablet 0   pantoprazole (PROTONIX) 40 MG tablet TAKE 1 TABLET BY MOUTH EVERY DAY 90 tablet 1   sertraline (ZOLOFT) 50 MG tablet TAKE 1 AND 1/2 TABLETS BY MOUTH DAILY 135 tablet 1   No facility-administered medications prior to visit.    Allergies  Allergen Reactions   Amoxicillin Hives   Codeine Phosphate Hives   Penicillins     Childhood allergy    ROS     Objective:    Physical Exam  There were no vitals taken for this visit. Wt Readings from Last 3 Encounters:  06/28/22 220 lb (99.8 kg)  02/03/22 218 lb (98.9 kg)  04/29/21 219 lb 6.4 oz (99.5 kg)   No acute distress. Normal respirations. Smiling and moving all extremities.     Assessment & Plan:   Problem List Items Addressed This Visit       Respiratory   Moderate persistent asthma   Relevant Medications   predniSONE (DELTASONE) 20 MG tablet   Other Visit Diagnoses     Cough productive of yellow sputum    -  Primary   Relevant Medications   doxycycline (VIBRA-TABS) 100 MG tablet      Doxycycline prescribed due to allergies and colchicine use.  Prednisone 40 mg x 5 days prescribed.   Discussed symptomatic treatment.  Continue usual allergy and asthma treatment.  Follow up if worsening or not improving in the next 2-3 days.   I am having [name] start on predniSONE and doxycycline. I am also having her maintain her sertraline, cetirizine, fluticasone, meloxicam, budesonide, albuterol, levalbuterol, colchicine, and pantoprazole.  Meds ordered this encounter  Medications   predniSONE (DELTASONE) 20 MG tablet    Sig: Take 2 tablets (40 mg total) by mouth  daily with breakfast.    Dispense:  10 tablet    Refill:  0    Order Specific Question:   Supervising Provider    Answer:   Hillard Danker A [[id number]]   doxycycline (VIBRA-TABS) 100 MG tablet    Sig: Take 1 tablet (100 mg total) by mouth 2 (two) times daily.    Dispense:  20 tablet    Refill:  0    Order Specific Question:   Supervising Provider    Answer:   Hillard Danker A [[id number]]    I discussed the assessment and treatment plan with the patient. The patient was provided an opportunity to ask questions and all were answered. The patient agreed with the plan and demonstrated an understanding of the instructions.   The patient was advised to call back or seek an in-person evaluation if the symptoms worsen or if the condition fails to improve as anticipated.  I provided 18 minutes of face-to-face time during this encounter.   Hetty Blend, NP-C [employer] at [place] [phone] (phone) [phone] (fax)  [facility]

## 2022-09-17 ENCOUNTER — Other Ambulatory Visit: Payer: Self-pay | Admitting: Family Medicine

## 2022-09-17 DIAGNOSIS — M1711 Unilateral primary osteoarthritis, right knee: Secondary | ICD-10-CM

## 2022-09-18 NOTE — Telephone Encounter (Signed)
No longer under prescriber care, will refuse.  Requested Prescriptions  Pending Prescriptions Disp Refills   meloxicam (MOBIC) 7.5 MG tablet [Pharmacy Med Name: MELOXICAM 7.5 MG TABLET] 90 tablet 0    Sig: TAKE 1 TABLET (7.5 MG TOTAL) BY MOUTH DAILY AS NEEDED. FOR PAIN     Analgesics:  COX2 Inhibitors Failed - 09/17/2022  2:59 PM      Failed - Manual Review: Labs are only required if the patient has taken medication for more than 8 weeks.      Passed - HGB in normal range and within 360 days    Hemoglobin  Date Value Ref Range Status  06/28/2022 13.9 12.0 - 15.0 g/dL Final  10/12/2018 13.8 11.1 - 15.9 g/dL Final         Passed - Cr in normal range and within 360 days    Creat  Date Value Ref Range Status  10/19/2013 1.02 0.50 - 1.10 mg/dL Final   Creatinine, Ser  Date Value Ref Range Status  06/28/2022 0.87 0.40 - 1.20 mg/dL Final         Passed - HCT in normal range and within 360 days    HCT  Date Value Ref Range Status  06/28/2022 41.5 36.0 - 46.0 % Final   Hematocrit  Date Value Ref Range Status  10/12/2018 40.6 34.0 - 46.6 % Final         Passed - AST in normal range and within 360 days    AST  Date Value Ref Range Status  06/28/2022 19 0 - 37 U/L Final         Passed - ALT in normal range and within 360 days    ALT  Date Value Ref Range Status  06/28/2022 17 0 - 35 U/L Final         Passed - eGFR is 30 or above and within 360 days    GFR, Est African American  Date Value Ref Range Status  12/11/2010 >60 >60 mL/min Final   GFR calc Af Amer  Date Value Ref Range Status  10/12/2018 69 >59 mL/min/1.73 Final   GFR, Est Non African American  Date Value Ref Range Status  12/11/2010 55 (L) >60 mL/min Final   GFR calc non Af Amer  Date Value Ref Range Status  10/12/2018 60 >59 mL/min/1.73 Final   GFR  Date Value Ref Range Status  06/28/2022 66.33 >60.00 mL/min Final    Comment:    Calculated using the CKD-EPI Creatinine Equation (2021)          Passed - Patient is not pregnant      Passed - Valid encounter within last 12 months    Recent Outpatient Visits           6 months ago Sore throat   Primary [facility] at [facility], Clyde Canterbury, MD   7 months ago Moderate persistent asthma with acute exacerbation   Primary Care at [facility], MD   1 year ago Essential hypertension   Primary Care at [facility], MD   1 year ago Essential hypertension   Primary Care at [facility], Bayard Beaver, MD   2 years ago Primary osteoarthritis of right knee   Primary Care at [facility], Bayard Beaver, MD

## 2022-11-01 ENCOUNTER — Ambulatory Visit: Payer: Medicare (Managed Care)

## 2022-11-18 ENCOUNTER — Other Ambulatory Visit: Payer: Self-pay | Admitting: Internal Medicine

## 2022-11-18 ENCOUNTER — Telehealth: Payer: Self-pay | Admitting: Internal Medicine

## 2022-11-18 DIAGNOSIS — J4541 Moderate persistent asthma with (acute) exacerbation: Secondary | ICD-10-CM

## 2022-11-18 MED ORDER — BUDESONIDE 0.5 MG/2ML IN SUSP
0.5000 mg | Freq: Two times a day (BID) | RESPIRATORY_TRACT | 1 refills | Status: DC
  Filled 2023-09-07: qty 360, 90d supply, fill #0
  Filled 2023-09-07: qty 120, 30d supply, fill #0
  Filled 2023-10-25: qty 360, 90d supply, fill #0

## 2022-11-18 MED ORDER — ALBUTEROL SULFATE HFA 108 (90 BASE) MCG/ACT IN AERS: INHALATION_SPRAY | RESPIRATORY_TRACT | 2 refills | Status: DC

## 2022-11-18 NOTE — Telephone Encounter (Signed)
Caller & Relationship to patient: Sister  Call back number: [phone]   Date of last office visit: 10.9.23  Date of next office visit: 3.27.24  Medication(s) to be refilled:  albuterol (VENTOLIN HFA) 108 (90 Base) MCG/ACT inhaler   budesonide (PULMICORT) 0.5 MG/2ML nebulizer solution    Preferred Pharmacy:   [id number]  Phone: [phone] Fax: [phone]

## 2022-12-03 ENCOUNTER — Other Ambulatory Visit: Payer: Self-pay | Admitting: Family Medicine

## 2022-12-03 DIAGNOSIS — J309 Allergic rhinitis, unspecified: Secondary | ICD-10-CM

## 2022-12-07 ENCOUNTER — Other Ambulatory Visit: Payer: Self-pay | Admitting: Internal Medicine

## 2022-12-07 DIAGNOSIS — M1A09X0 Idiopathic chronic gout, multiple sites, without tophus (tophi): Secondary | ICD-10-CM

## 2022-12-15 ENCOUNTER — Ambulatory Visit: Payer: Medicare (Managed Care) | Admitting: Internal Medicine

## 2022-12-15 ENCOUNTER — Other Ambulatory Visit: Payer: Self-pay | Admitting: Internal Medicine

## 2022-12-15 DIAGNOSIS — J4541 Moderate persistent asthma with (acute) exacerbation: Secondary | ICD-10-CM

## 2022-12-15 MED ORDER — ALBUTEROL SULFATE HFA 108 (90 BASE) MCG/ACT IN AERS: 1.0000 | INHALATION_SPRAY | Freq: Four times a day (QID) | RESPIRATORY_TRACT | 2 refills | Status: DC | PRN

## 2022-12-16 ENCOUNTER — Other Ambulatory Visit: Payer: Self-pay | Admitting: Family Medicine

## 2022-12-16 DIAGNOSIS — F325 Major depressive disorder, single episode, in full remission: Secondary | ICD-10-CM

## 2022-12-16 DIAGNOSIS — M1711 Unilateral primary osteoarthritis, right knee: Secondary | ICD-10-CM

## 2023-01-12 ENCOUNTER — Telehealth: Payer: Self-pay

## 2023-01-12 NOTE — Telephone Encounter (Signed)
Called patient to schedule Medicare Annual Wellness Visit (AWV). Left message for patient to call back and schedule Medicare Annual Wellness Visit (AWV).  Last date of AWV: 12/04/21  Please schedule an appointment at any time on Annual Wellness Visit Schedule.

## 2023-01-21 ENCOUNTER — Other Ambulatory Visit: Payer: Self-pay | Admitting: Family Medicine

## 2023-01-21 DIAGNOSIS — J309 Allergic rhinitis, unspecified: Secondary | ICD-10-CM

## 2023-02-02 ENCOUNTER — Ambulatory Visit: Payer: Medicare (Managed Care) | Admitting: Internal Medicine

## 2023-02-06 ENCOUNTER — Other Ambulatory Visit: Payer: Self-pay | Admitting: Family Medicine

## 2023-02-06 DIAGNOSIS — F325 Major depressive disorder, single episode, in full remission: Secondary | ICD-10-CM

## 2023-03-13 ENCOUNTER — Other Ambulatory Visit: Payer: Self-pay | Admitting: Family Medicine

## 2023-03-13 DIAGNOSIS — K219 Gastro-esophageal reflux disease without esophagitis: Secondary | ICD-10-CM

## 2023-03-16 ENCOUNTER — Telehealth: Payer: Self-pay | Admitting: Internal Medicine

## 2023-03-16 NOTE — Telephone Encounter (Signed)
Prescription Request  03/16/2023  LOV: 06/28/2022  What is the name of the medication or equipment?  sertraline (ZOLOFT) 50 MG tablet   Have you contacted your pharmacy to request a refill? No   Which pharmacy would you like this sent to?     [id number] [place], [place] - [address] [place] [address] Phone: [phone] Fax: [phone]  Patient notified that their request is being sent to the clinical staff for review and that they should receive a response within 2 business days.   Please advise at Mobile [phone] (mobile)

## 2023-03-21 NOTE — Telephone Encounter (Signed)
Patient has been scheduled for soonest availability of 04/13/2023. She is out of her medication and would like to know if she can get enough to last until her appointment.

## 2023-03-22 NOTE — Telephone Encounter (Signed)
Called Paula, LVM

## 2023-03-22 NOTE — Telephone Encounter (Signed)
Patients sister wants you to call her back

## 2023-03-22 NOTE — Telephone Encounter (Signed)
OV moved to 7/3 @ 11am

## 2023-03-22 NOTE — Telephone Encounter (Signed)
Spoke to pt's sister, Gunnar Fusi, to offer moving OV to tomorrow 7/3 or Monday 7/7 to receive refill. She would check her schedule and give the office a call back.

## 2023-03-23 ENCOUNTER — Encounter: Payer: Self-pay | Admitting: Internal Medicine

## 2023-03-23 ENCOUNTER — Ambulatory Visit: Payer: Medicare (Managed Care) | Admitting: Internal Medicine

## 2023-03-23 VITALS — BP 128/80 | HR 72 | Temp 98.0°F | Resp 16 | Ht 67.0 in | Wt 207.0 lb

## 2023-03-23 DIAGNOSIS — R7303 Prediabetes: Secondary | ICD-10-CM | POA: Diagnosis not present

## 2023-03-23 DIAGNOSIS — Z1231 Encounter for screening mammogram for malignant neoplasm of breast: Secondary | ICD-10-CM

## 2023-03-23 DIAGNOSIS — J4541 Moderate persistent asthma with (acute) exacerbation: Secondary | ICD-10-CM

## 2023-03-23 DIAGNOSIS — I10 Essential (primary) hypertension: Secondary | ICD-10-CM

## 2023-03-23 DIAGNOSIS — Z0001 Encounter for general adult medical examination with abnormal findings: Secondary | ICD-10-CM

## 2023-03-23 DIAGNOSIS — R109 Unspecified abdominal pain: Secondary | ICD-10-CM | POA: Insufficient documentation

## 2023-03-23 DIAGNOSIS — E785 Hyperlipidemia, unspecified: Secondary | ICD-10-CM

## 2023-03-23 DIAGNOSIS — M1A09X0 Idiopathic chronic gout, multiple sites, without tophus (tophi): Secondary | ICD-10-CM | POA: Diagnosis not present

## 2023-03-23 DIAGNOSIS — R10A1 Flank pain, right side: Secondary | ICD-10-CM

## 2023-03-23 DIAGNOSIS — Z0000 Encounter for general adult medical examination without abnormal findings: Secondary | ICD-10-CM

## 2023-03-23 DIAGNOSIS — F02818 Dementia in other diseases classified elsewhere, unspecified severity, with other behavioral disturbance: Secondary | ICD-10-CM

## 2023-03-23 DIAGNOSIS — Z1211 Encounter for screening for malignant neoplasm of colon: Secondary | ICD-10-CM

## 2023-03-23 LAB — BASIC METABOLIC PANEL
BUN: 12 mg/dL (ref 6–23)
CO2: 32 mEq/L (ref 19–32)
Calcium: 9.7 mg/dL (ref 8.4–10.5)
Chloride: 101 mEq/L (ref 96–112)
Creatinine, Ser: 0.9 mg/dL (ref 0.40–1.20)
GFR: 63.36 mL/min (ref >60.00)
Glucose, Bld: 93 mg/dL (ref 70–99)
Potassium: 3.8 mEq/L (ref 3.5–5.1)
Sodium: 141 mEq/L (ref 135–145)

## 2023-03-23 LAB — HEPATIC FUNCTION PANEL
ALT: 23 U/L (ref 0–35)
AST: 23 U/L (ref 0–37)
Albumin: 4.3 g/dL (ref 3.5–5.2)
Alkaline Phosphatase: 54 U/L (ref 39–117)
Bilirubin, Direct: 0.1 mg/dL (ref 0.0–0.3)
Total Bilirubin: 0.7 mg/dL (ref 0.2–1.2)
Total Protein: 7.5 g/dL (ref 6.0–8.3)

## 2023-03-23 LAB — CBC WITH DIFFERENTIAL/PLATELET
Basophils Absolute: 0 10*3/uL (ref 0.0–0.1)
Basophils Relative: 0.6 % (ref 0.0–3.0)
Eosinophils Absolute: 0.1 10*3/uL (ref 0.0–0.7)
Eosinophils Relative: 1.8 % (ref 0.0–5.0)
HCT: 43.5 % (ref 36.0–46.0)
Hemoglobin: 14.2 g/dL (ref 12.0–15.0)
Lymphocytes Relative: 22.7 % (ref 12.0–46.0)
Lymphs Abs: 1.2 10*3/uL (ref 0.7–4.0)
MCHC: 32.7 g/dL (ref 30.0–36.0)
MCV: 87.2 fl (ref 78.0–100.0)
Monocytes Absolute: 0.7 10*3/uL (ref 0.1–1.0)
Monocytes Relative: 13.3 % — ABNORMAL HIGH (ref 3.0–12.0)
Neutro Abs: 3.2 10*3/uL (ref 1.4–7.7)
Neutrophils Relative %: 61.6 % (ref 43.0–77.0)
Platelets: 303 10*3/uL (ref 150.0–400.0)
RBC: 4.98 Mil/uL (ref 3.87–5.11)
RDW: 13.3 % (ref 11.5–15.5)
WBC: 5.1 10*3/uL (ref 4.0–10.5)

## 2023-03-23 LAB — LIPID PANEL
Cholesterol: 169 mg/dL (ref 0–200)
HDL: 57.5 mg/dL (ref >39.00)
LDL Cholesterol: 98 mg/dL (ref 0–99)
NonHDL: 111.68
Total CHOL/HDL Ratio: 3
Triglycerides: 68 mg/dL (ref 0.0–149.0)
VLDL: 13.6 mg/dL (ref 0.0–40.0)

## 2023-03-23 LAB — URINALYSIS, ROUTINE W REFLEX MICROSCOPIC
Bilirubin Urine: NEGATIVE
Hgb urine dipstick: NEGATIVE
Ketones, ur: NEGATIVE
Nitrite: NEGATIVE
Specific Gravity, Urine: 1.02 (ref 1.000–1.030)
Total Protein, Urine: NEGATIVE
Urine Glucose: NEGATIVE
Urobilinogen, UA: 0.2 (ref 0.0–1.0)
pH: 7 (ref 5.0–8.0)

## 2023-03-23 LAB — TSH: TSH: 1.51 u[IU]/mL (ref 0.35–5.50)

## 2023-03-23 LAB — HEMOGLOBIN A1C: Hgb A1c MFr Bld: 5.7 % (ref 4.6–6.5)

## 2023-03-23 LAB — URIC ACID: Uric Acid, Serum: 4.7 mg/dL (ref 2.4–7.0)

## 2023-03-23 MED ORDER — ROSUVASTATIN CALCIUM 10 MG PO TABS
10.0000 mg | ORAL_TABLET | Freq: Every day | ORAL | 1 refills | Status: DC
Start: 2023-03-23 — End: 2023-07-12

## 2023-03-23 MED ORDER — BREXPIPRAZOLE 0.25 MG PO TABS
0.2500 mg | ORAL_TABLET | Freq: Every day | ORAL | 0 refills | Status: DC
Start: 2023-03-23 — End: 2023-04-12

## 2023-03-23 NOTE — Progress Notes (Signed)
Subjective:  Patient ID: Alyssa Powell, female    DOB: Jan 18, 1951  Age: 72 y.o. MRN: [id number]  CC: Hyperlipidemia, Hypertension, Asthma, and Annual Exam   HPI Alyssa Powell presents for a CPX and f/up ---  Discussed the use of AI scribe software for clinical note transcription with the patient, who gave verbal consent to proceed.  History of Present Illness   The patient, with a history of special needs and asthma, has been exhibiting signs of agitation and possible dementia over the past year. These episodes, described by the caregiver, include confusion, delusions, and occasional destructive behavior. The patient has been reported to have conversations that are out of context and sometimes dramatic, with a recurrent theme of danger or harm. There is no reported threat to self or others.  The patient's agitation seems to be exacerbated by interactions with her mother, with whom she lives. The caregiver reports that the patient was previously on sertraline for depression, but this was discontinued when the patient switched primary care providers.  In addition to the mental health concerns, the patient has a history of asthma and has rare wheezing.   The patient's cognitive function appears to be declining, as evidenced by difficulty recalling the date and her date of birth. The caregiver believes this decline is indicative of dementia. The patient has been special needs her entire life and has never been employed, with the family providing care.  There is no reported refusal to eat, chest pain, or shortness of breath outside of the asthma episodes. The patient does not report any abdominal pain.       Outpatient Medications Prior to Visit  Medication Sig Dispense Refill   albuterol (VENTOLIN HFA) 108 (90 Base) MCG/ACT inhaler Inhale 1-2 puffs into the lungs every 6 (six) hours as needed for wheezing or shortness of breath. INHALE 1-2 PUFFS BY MOUTH EVERY 6 HOURS AS NEEDED FOR WHEEZE OR  SHORTNESS OF BREATH 18 each 2   budesonide (PULMICORT) 0.5 MG/2ML nebulizer solution Take 2 mLs (0.5 mg total) by nebulization 2 (two) times daily. 360 mL 1   cetirizine (ZYRTEC) 10 MG tablet TAKE 1 TABLET BY MOUTH EVERYDAY AT BEDTIME 90 tablet 1   colchicine 0.6 MG tablet TAKE 1 TABLET BY MOUTH TWICE A DAY 180 tablet 0   fluticasone (FLONASE) 50 MCG/ACT nasal spray Place 2 sprays into both nostrils daily. 48 mL 2   levalbuterol (XOPENEX) 1.25 MG/3ML nebulizer solution Take 1.25 mg by nebulization every 4 (four) hours as needed for wheezing. 72 mL 5   meloxicam (MOBIC) 7.5 MG tablet TAKE 1 TABLET (7.5 MG TOTAL) BY MOUTH DAILY AS NEEDED. FOR PAIN 90 tablet 0   pantoprazole (PROTONIX) 40 MG tablet TAKE 1 TABLET BY MOUTH EVERY DAY 90 tablet 1   predniSONE (DELTASONE) 20 MG tablet Take 2 tablets (40 mg total) by mouth daily with breakfast. 10 tablet 0   sertraline (ZOLOFT) 50 MG tablet TAKE 1 AND 1/2 TABLETS BY MOUTH DAILY 135 tablet 1   No facility-administered medications prior to visit.    ROS Review of Systems  Constitutional:  Positive for unexpected weight change (wt loss). Negative for chills, diaphoresis and fatigue.  HENT: Negative.    Eyes: Negative.   Respiratory:  Negative for cough, chest tightness, shortness of breath and wheezing.   Cardiovascular:  Negative for chest pain, palpitations and leg swelling.  Gastrointestinal:  Negative for abdominal pain, constipation, diarrhea, nausea and vomiting.  Genitourinary:  Positive for flank pain.  Right flank pain  Musculoskeletal:  Negative for back pain, myalgias and neck stiffness.  Skin: Negative.  Negative for color change and pallor.  Psychiatric/Behavioral:  Positive for agitation, behavioral problems, confusion, decreased concentration and dysphoric mood. Negative for self-injury and sleep disturbance.     Objective:  BP 128/80 (BP Location: Right Arm, Patient Position: Sitting, Cuff Size: Large)   Pulse 72   Temp 98 F  (36.7 C) (Oral)   Resp 16   Ht 5\' 7"  (1.702 m)   Wt 207 lb (93.9 kg)   SpO2 98%   BMI 32.42 kg/m   BP Readings from Last 3 Encounters:  03/23/23 128/80  06/28/22 128/76  02/03/22 123/78    Wt Readings from Last 3 Encounters:  03/23/23 207 lb (93.9 kg)  06/28/22 220 lb (99.8 kg)  02/03/22 218 lb (98.9 kg)    Physical Exam Vitals reviewed.  Constitutional:      General: She is not in acute distress.    Appearance: She is not toxic-appearing or diaphoretic.  HENT:     Mouth/Throat:     Mouth: Mucous membranes are moist.  Eyes:     General: No scleral icterus.    Conjunctiva/sclera: Conjunctivae normal.  Cardiovascular:     Rate and Rhythm: Normal rate and regular rhythm.     Heart sounds: No murmur heard.    No friction rub. No gallop.     Comments: EKG- NSR, 74 bpm No LVH, Q waves, or ST/T wave changes Normal EKG Pulmonary:     Effort: Pulmonary effort is normal.     Breath sounds: No stridor. No wheezing, rhonchi or rales.  Abdominal:     Palpations: There is no mass.     Tenderness: There is no abdominal tenderness. There is no guarding.     Hernia: No hernia is present.  Musculoskeletal:     Cervical back: Neck supple.     Right lower leg: No edema.     Left lower leg: No edema.  Skin:    General: Skin is warm and dry.  Neurological:     Mental Status: She is alert. Mental status is at baseline.  Psychiatric:        Attention and Perception: She is inattentive.        Mood and Affect: Mood normal. Mood is not anxious or elated. Affect is flat. Affect is not blunt.        Speech: She is communicative. Speech is delayed. Speech is not rapid and pressured, slurred or tangential.        Behavior: Behavior normal. Behavior is cooperative.        Thought Content: Thought content is delusional. Thought content is not paranoid. Thought content does not include homicidal or suicidal ideation. Thought content does not include homicidal or suicidal plan.         Cognition and Memory: Cognition is impaired. Memory is impaired.     Lab Results  Component Value Date   WBC 5.1 03/23/2023   HGB 14.2 03/23/2023   HCT 43.5 03/23/2023   PLT 303.0 03/23/2023   GLUCOSE 93 03/23/2023   CHOL 169 03/23/2023   TRIG 68.0 03/23/2023   HDL 57.50 03/23/2023   LDLCALC 98 03/23/2023   ALT 23 03/23/2023   AST 23 03/23/2023   NA 141 03/23/2023   K 3.8 03/23/2023   CL 101 03/23/2023   CREATININE 0.90 03/23/2023   BUN 12 03/23/2023   CO2 32 03/23/2023   TSH 1.51 03/23/2023  HGBA1C 5.7 03/23/2023    DG Chest 2 View  Result Date: 06/24/2016 CLINICAL DATA:  Cough and shortness of breath for 2 weeks, some improvement with course of steroids, expiratory wheezing, history asthma and hypertension EXAM: CHEST  2 VIEW COMPARISON:  Of 18 2015 FINDINGS: Normal heart size, mediastinal contours, and pulmonary vascularity. Lungs grossly clear. No pulmonary infiltrate, pleural effusion or pneumothorax. Bones demineralized. IMPRESSION: No acute abnormalities. Electronically Signed   By: Ulyses Southward M.D.   On: 06/24/2016 19:16    Assessment & Plan:   Encounter for general adult medical examination with abnormal findings-exam completed, labs reviewed, vaccines reviewed, cancer screenings addressed, patient education was given.  Essential hypertension- Her blood pressure is well-controlled.  Labs and EKG are reassuring. -     CBC with Differential/Platelet; Future -     Basic metabolic panel; Future -     TSH; Future -     Urinalysis, Routine w reflex microscopic; Future -     Hepatic function panel; Future -     EKG 12-Lead  Hyperlipidemia with target LDL less than 130- Will start a statin for cardiovascular risk reduction. -     Lipid panel; Future -     TSH; Future -     Hepatic function panel; Future -     Rosuvastatin Calcium; Take 1 tablet (10 mg total) by mouth daily.  Dispense: 90 tablet; Refill: 1  Idiopathic chronic gout of multiple sites without tophus -      Basic metabolic panel; Future -     Uric acid; Future  Moderate persistent asthma with acute exacerbation -     CBC with Differential/Platelet; Future  Visit for screening mammogram -     3D Screening Mammogram, Left and Right; Future  Screen for colon cancer -     Ambulatory referral to Gastroenterology  Acute right flank pain- Her labs are unremarkable. 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 -     Ambulatory referral to Neurology -     Brexpiprazole; Take 1 tablet (0.25 mg total) by mouth daily.  Dispense: 30 tablet; Refill: 0 -     AMB Referral to Chronic Care Management Services     Follow-up: Return in about 6 months (around 09/23/2023).  Sanda Linger, MD

## 2023-03-23 NOTE — Patient Instructions (Signed)

## 2023-03-24 ENCOUNTER — Encounter: Payer: Self-pay | Admitting: Internal Medicine

## 2023-03-28 ENCOUNTER — Other Ambulatory Visit: Payer: Self-pay | Admitting: Internal Medicine

## 2023-03-28 DIAGNOSIS — F02818 Dementia in other diseases classified elsewhere, unspecified severity, with other behavioral disturbance: Secondary | ICD-10-CM

## 2023-03-30 ENCOUNTER — Encounter: Payer: Self-pay | Admitting: Physician Assistant

## 2023-04-01 ENCOUNTER — Telehealth: Payer: Self-pay | Admitting: Internal Medicine

## 2023-04-01 NOTE — Telephone Encounter (Signed)
Pt Daughter called wanting to speak a nurse about her mom she is having a bad cough and wants to know what she need to do. Please advise.

## 2023-04-02 NOTE — Telephone Encounter (Signed)
She needs to be seen.

## 2023-04-04 NOTE — Telephone Encounter (Signed)
Returned [employer] call, she was unable to discuss at this time and would call back once she has a free moment.

## 2023-04-05 ENCOUNTER — Ambulatory Visit: Payer: Medicare (Managed Care)

## 2023-04-05 VITALS — Ht 67.0 in | Wt 207.0 lb

## 2023-04-05 DIAGNOSIS — Z0000 Encounter for general adult medical examination without abnormal findings: Secondary | ICD-10-CM | POA: Diagnosis not present

## 2023-04-05 NOTE — Progress Notes (Signed)
Subjective:   Alyssa Powell is a 72 y.o. female who presents for Medicare Annual (Subsequent) preventive examination.  Visit Complete: Virtual  I connected with  Alyssa Powell on 04/05/23 by a audio enabled telemedicine application and verified that I am speaking with the correct person using two identifiers.  Patient Location: Home  Provider Location: Home Office  I discussed the limitations of evaluation and management by telemedicine. The patient expressed understanding and agreed to proceed.  Patient Medicare AWV questionnaire was completed by the patient on ; I have confirmed that all information answered by patient is correct and no changes since this date.  Review of Systems     Cardiac Risk Factors include: advanced age (>32men, >61 women);hypertension     Objective:    Today's Vitals   04/05/23 1332  Weight: 207 lb (93.9 kg)  Height: 5\' 7"  (1.702 m)   Body mass index is 32.42 kg/m.     04/05/2023    1:48 PM 09/09/2020    2:21 PM 06/13/2017    1:58 PM 08/03/2016    2:36 PM 06/24/2016    4:31 PM 06/18/2016    2:12 PM 12/03/2015    3:22 PM  Advanced Directives  Does Patient Have a Medical Advance Directive? No No No No No No No  Would patient like information on creating a medical advance directive? No - Patient declined Yes (Inpatient - patient defers creating a medical advance directive at this time - Information given) No - Patient declined No - patient declined information  No - patient declined information No - patient declined information    Current Medications (verified) Outpatient Encounter Medications as of 04/05/2023  Medication Sig   albuterol (VENTOLIN HFA) 108 (90 Base) MCG/ACT inhaler Inhale 1-2 puffs into the lungs every 6 (six) hours as needed for wheezing or shortness of breath. INHALE 1-2 PUFFS BY MOUTH EVERY 6 HOURS AS NEEDED FOR WHEEZE OR SHORTNESS OF BREATH   brexpiprazole (REXULTI) 0.25 MG TABS tablet Take 1 tablet (0.25 mg total) by mouth daily.    budesonide (PULMICORT) 0.5 MG/2ML nebulizer solution Take 2 mLs (0.5 mg total) by nebulization 2 (two) times daily.   cetirizine (ZYRTEC) 10 MG tablet TAKE 1 TABLET BY MOUTH EVERYDAY AT BEDTIME   colchicine 0.6 MG tablet TAKE 1 TABLET BY MOUTH TWICE A DAY   fluticasone (FLONASE) 50 MCG/ACT nasal spray Place 2 sprays into both nostrils daily.   levalbuterol (XOPENEX) 1.25 MG/3ML nebulizer solution Take 1.25 mg by nebulization every 4 (four) hours as needed for wheezing.   meloxicam (MOBIC) 7.5 MG tablet TAKE 1 TABLET (7.5 MG TOTAL) BY MOUTH DAILY AS NEEDED. FOR PAIN   pantoprazole (PROTONIX) 40 MG tablet TAKE 1 TABLET BY MOUTH EVERY DAY   predniSONE (DELTASONE) 20 MG tablet Take 2 tablets (40 mg total) by mouth daily with breakfast.   rosuvastatin (CRESTOR) 10 MG tablet Take 1 tablet (10 mg total) by mouth daily.   sertraline (ZOLOFT) 50 MG tablet TAKE 1 AND 1/2 TABLETS BY MOUTH DAILY   No facility-administered encounter medications on file as of 04/05/2023.    Allergies (verified) Amoxicillin, Codeine phosphate, and Penicillins   History: Past Medical History:  Diagnosis Date   Allergy    Asthma    Hypertension    Mental retardation    Past Surgical History:  Procedure Laterality Date   HERNIA REPAIR     Family History  Problem Relation Age of Onset   Arthritis Mother    Asthma Father  Alcoholism Father    Asthma Sister    Diabetes Brother    Alcoholism Brother    Social History   Socioeconomic History   Marital status: Single    Spouse name: Not on file   Number of children: Not on file   Years of education: Not on file   Highest education level: Not on file  Occupational History   Not on file  Tobacco Use   Smoking status: Never   Smokeless tobacco: Never  Vaping Use   Vaping status: Never Used  Substance and Sexual Activity   Alcohol use: Not Currently   Drug use: Never   Sexual activity: Never  Other Topics Concern   Not on file  Social History  Narrative   ** Merged History Encounter **       Social Determinants of Health   Financial Resource Strain: Low Risk  (04/05/2023)   Overall Financial Resource Strain (CARDIA)    Difficulty of Paying Living Expenses: Not hard at all  Food Insecurity: No Food Insecurity (04/05/2023)   Hunger Vital Sign    Worried About Running Out of Food in the Last Year: Never true    Ran Out of Food in the Last Year: Never true  Transportation Needs: No Transportation Needs (04/05/2023)   PRAPARE - Administrator, Civil Service (Medical): No    Lack of Transportation (Non-Medical): No  Physical Activity: Inactive (04/05/2023)   Exercise Vital Sign    Days of Exercise per Week: 0 days    Minutes of Exercise per Session: 0 min  Stress: Patient Unable To Answer (04/05/2023)   [employer] [employer] - Occupational Stress Questionnaire    Feeling of Stress : Patient unable to answer  Social Connections: Moderately Integrated (04/05/2023)   Social Connection and Isolation Panel [NHANES]    Frequency of Communication with Friends and Family: More than three times a week    Frequency of Social Gatherings with Friends and Family: More than three times a week    Attends Religious Services: More than 4 times per year    Active Member of [employer] or Organizations: Yes    Attends Engineer, structural: More than 4 times per year    Marital Status: Never married    Tobacco Counseling Counseling given: Not Answered   Clinical Intake:  Pre-visit preparation completed: No  Pain : No/denies pain     BMI - recorded: 32.42 Nutritional Status: BMI > 30  Obese Nutritional Risks: None Diabetes: No  How often do you need to have someone help you when you read instructions, pamphlets, or other written materials from your doctor or pharmacy?: 1 - Never  Interpreter Needed?: No  Information entered by :: Theresa Mulligan LPN   Activities of Daily Living    04/05/2023     1:44 PM  In your present state of health, do you have any difficulty performing the following activities:  Hearing? 0  Vision? 0  Difficulty concentrating or making decisions? 1  Comment Dx: Dementia  Walking or climbing stairs? 0  Dressing or bathing? 0  Comment Aide assist  Doing errands, shopping? 1  Comment Aide Ship broker and eating ? Y  Comment Aide assist  Using the Toilet? N  In the past six months, have you accidently leaked urine? N  Do you have problems with loss of bowel control? N  Managing your Medications? Y  Comment Aide assist  Managing your Finances? Jeannie Fend  Comment aide assist  Housekeeping or managing your Housekeeping? Y  Comment Aide assist    Patient Care Team: [name], MD as PCP - General (Internal Medicine) Tillman Sers, DO (Inactive)  Indicate any recent Medical Services you may have received from other than [facility] providers in the past year (date may be approximate).     Assessment:   This is a routine wellness examination for Jonne.  Hearing/Vision screen Hearing Screening - Comments:: Denies hearing difficulties   Vision Screening - Comments:: Wears rx glasses - up to date with routine eye exams with  Deferred  Dietary issues and exercise activities discussed:     Goals Addressed             This Visit's Progress    Lose weight       Mother states patient wants to lose weight.       Depression Screen    03/23/2023   11:04 AM 02/03/2022    3:37 PM 12/04/2021    1:52 PM 04/29/2021    4:15 PM 11/06/2020   10:08 AM 09/09/2020    2:21 PM 11/14/2019    2:11 PM  PHQ 2/9 Scores  PHQ - 2 Score  0  0  0 0  PHQ- 9 Score  0  0   0  Exception Documentation Medical reason  Patient refusal  Other- indicate reason in comment box    Not completed     unable to verbally speak for self      Fall Risk    04/05/2023    1:47 PM 03/23/2023   11:04 AM 12/04/2021    2:00 PM 09/09/2020    2:21 PM 11/14/2019    2:14 PM  Fall Risk    Falls in the past year? 0 0 0 0 0  Number falls in past yr: 0 0 0 0 0  Injury with Fall? 0 0 0 0 0  Risk for fall due to : No Fall Risks No Fall Risks  No Fall Risks   Follow up Falls prevention discussed Falls evaluation completed  Falls evaluation completed     MEDICARE RISK AT HOME:  Medicare Risk at Home - 04/05/23 1353     Any stairs in or around the home? Yes    If so, are there any without handrails? No    Home free of loose throw rugs in walkways, pet beds, electrical cords, etc? Yes    Adequate lighting in your home to reduce risk of falls? Yes    Life alert? No    Use of a cane, walker or w/c? No    Grab bars in the bathroom? No    Shower chair or bench in shower? No    Elevated toilet seat or a handicapped toilet? No             TIMED UP AND GO:  Was the test performed?  No    Cognitive Function:    09/09/2020    2:37 PM 10/12/2018    2:58 PM  MMSE - Mini Mental State Exam  Not completed: Unable to complete   Orientation to time  2  Orientation to Place  3  Registration  2  Attention/ Calculation  0  Recall  0  Language- name 2 objects  2  Language- repeat  0  Language- follow 3 step command  3  Language- read & follow direction  0  Write a sentence  0  Copy design  0  Total score  12        04/05/2023    1:48 PM  6CIT Screen  What Year? --  What month? --  What time? --  Count back from 20 --  Months in reverse --  Repeat phrase --    Immunizations Immunization History  Administered Date(s) Administered   Fluad Quad(high Dose 65+) 06/28/2022   Influenza Whole 08/07/2009   Influenza,inj,Quad PF,6+ Mos 07/09/2013, 08/05/2014, 07/08/2015, 09/09/2020   Moderna SARS-COV2 Booster Vaccination 10/29/2020, 03/30/2021   Moderna Sars-Covid-2 Vaccination 04/02/2020, 04/30/2020   PNEUMOCOCCAL CONJUGATE-20 06/28/2022   Td 07/21/1994, 03/05/2009   Tdap 09/09/2020    TDAP status: Up to date  Flu Vaccine status: Up to date  Pneumococcal  vaccine status: Up to date    Qualifies for Shingles Vaccine? Yes   Zostavax completed No   Shingrix Completed?: No.    Education has been provided regarding the importance of this vaccine. Patient has been advised to call insurance company to determine out of pocket expense if they have not yet received this vaccine. Advised may also receive vaccine at local pharmacy or Health Dept. Verbalized acceptance and understanding.  Screening Tests Health Maintenance  Topic Date Due   Colonoscopy  Never done   MAMMOGRAM  Never done   Zoster Vaccines- Shingrix (1 of 2) Never done   DEXA SCAN  Never done   INFLUENZA VACCINE  04/21/2023   Medicare Annual Wellness (AWV)  04/04/2024   DTaP/Tdap/Td (4 - Td or Tdap) 09/09/2030   Pneumonia Vaccine 85+ Years old  Completed   Hepatitis C Screening  Completed   HPV VACCINES  Aged Out   COVID-19 Vaccine  Discontinued    Health Maintenance  Health Maintenance Due  Topic Date Due   Colonoscopy  Never done   MAMMOGRAM  Never done   Zoster Vaccines- Shingrix (1 of 2) Never done   DEXA SCAN  Never done    Colorectal cancer screening: Referral to GI placed Deferred. Pt aware the office will call re: appt.  Mammogram status: Completed 05/27/23. Repeat every year  Bone Density status: Ordered Deferred. Pt provided with contact info and advised to call to schedule appt.  Lung Cancer Screening: (Low Dose CT Chest recommended if Age 42-80 years, 20 pack-year currently smoking OR have quit w/in 15years.) does not qualify.     Additional Screening:  Hepatitis C Screening: does qualify; Completed 10/12/18  Vision Screening: Recommended annual ophthalmology exams for early detection of glaucoma and other disorders of the eye. Is the patient up to date with their annual eye exam?  Yes  Who is the provider or what is the name of the office in which the patient attends annual eye exams? Deferred If pt is not established with a provider, would they like to  be referred to a provider to establish care? No .   Dental Screening: Recommended annual dental exams for proper oral hygiene   Community Resource Referral / Chronic Care Management:  CRR required this visit?  No   CCM required this visit?  No     Plan:     I have personally reviewed and noted the following in the patient's chart:   Medical and social history Use of alcohol, tobacco or illicit drugs  Current medications and supplements including opioid prescriptions. Patient is not currently taking opioid prescriptions. Functional ability and status Nutritional status Physical activity Advanced directives List of other physicians Hospitalizations, surgeries, and ER visits in previous 12 months  Vitals Screenings to include cognitive, depression, and falls Referrals and appointments  In addition, I have reviewed and discussed with patient certain preventive protocols, quality metrics, and best practice recommendations. A written personalized care plan for preventive services as well as general preventive health recommendations were provided to patient.     Tillie Rung, LPN   [id number]   After Visit Summary: (MyChart) Due to this being a telephonic visit, the after visit summary with patients personalized plan was offered to patient via MyChart   Nurse Notes: None

## 2023-04-05 NOTE — Patient Instructions (Addendum)
[name] , Thank you for taking time to come for your Medicare Wellness Visit. I appreciate your ongoing commitment to your health goals. Please review the following plan we discussed and let me know if I can assist you in the future.   These are the goals we discussed:  Goals      CCM Expected Outcome:  Monitor, Self-Manage and Reduce Symptoms of:     Lose weight     Mother states patient wants to lose weight.     Weight (lb) < 200 lb (90.7 kg)     Pt's mom states the patient needs to lose weight.         This is a list of the screening recommended for you and due dates:  Health Maintenance  Topic Date Due   Colon Cancer Screening  Never done   Mammogram  Never done   Zoster (Shingles) Vaccine (1 of 2) Never done   DEXA scan (bone density measurement)  Never done   Flu Shot  04/21/2023   Medicare Annual Wellness Visit  04/04/2024   DTaP/Tdap/Td vaccine (4 - Td or Tdap) 09/09/2030   Pneumonia Vaccine  Completed   Hepatitis C Screening  Completed   HPV Vaccine  Aged Out   COVID-19 Vaccine  Discontinued    Advanced directives: Advance directive discussed with you today. Even though you declined this today, please call our office should you change your mind, and we can give you the proper paperwork for you to fill out.   Conditions/risks identified: None  Next appointment: Follow up in one year for your annual wellness visit     Preventive Care 65 Years and Older, Female Preventive care refers to lifestyle choices and visits with your health care provider that can promote health and wellness. What does preventive care include? A yearly physical exam. This is also called an annual well check. Dental exams once or twice a year. Routine eye exams. Ask your health care provider how often you should have your eyes checked. Personal lifestyle choices, including: Daily care of your teeth and gums. Regular physical activity. Eating a healthy diet. Avoiding tobacco and drug  use. Limiting alcohol use. Practicing safe sex. Taking low-dose aspirin every day. Taking vitamin and mineral supplements as recommended by your health care provider. What happens during an annual well check? The services and screenings done by your health care provider during your annual well check will depend on your age, overall health, lifestyle risk factors, and family history of disease. Counseling  Your health care provider may ask you questions about your: Alcohol use. Tobacco use. Drug use. Emotional well-being. Home and relationship well-being. Sexual activity. Eating habits. History of falls. Memory and ability to understand (cognition). Work and work Astronomer. Reproductive health. Screening  You may have the following tests or measurements: Height, weight, and BMI. Blood pressure. Lipid and cholesterol levels. These may be checked every 5 years, or more frequently if you are over 66 years old. Skin check. Lung cancer screening. You may have this screening every year starting at age 15 if you have a 30-pack-year history of smoking and currently smoke or have quit within the past 15 years. Fecal occult blood test (FOBT) of the stool. You may have this test every year starting at age 11. Flexible sigmoidoscopy or colonoscopy. You may have a sigmoidoscopy every 5 years or a colonoscopy every 10 years starting at age 54. Hepatitis C blood test. Hepatitis B blood test. Sexually transmitted disease (STD)  testing. Diabetes screening. This is done by checking your blood sugar (glucose) after you have not eaten for a while (fasting). You may have this done every 1-3 years. Bone density scan. This is done to screen for osteoporosis. You may have this done starting at age 69. Mammogram. This may be done every 1-2 years. Talk to your health care provider about how often you should have regular mammograms. Talk with your health care provider about your test results, treatment  options, and if necessary, the need for more tests. Vaccines  Your health care provider may recommend certain vaccines, such as: Influenza vaccine. This is recommended every year. Tetanus, diphtheria, and acellular pertussis (Tdap, Td) vaccine. You may need a Td booster every 10 years. Zoster vaccine. You may need this after age 7. Pneumococcal 13-valent conjugate (PCV13) vaccine. One dose is recommended after age 14. Pneumococcal polysaccharide (PPSV23) vaccine. One dose is recommended after age 2. Talk to your health care provider about which screenings and vaccines you need and how often you need them. This information is not intended to replace advice given to you by your health care provider. Make sure you discuss any questions you have with your health care provider. Document Released: 10/03/2015 Document Revised: 05/26/2016 Document Reviewed: 07/08/2015 Elsevier Interactive Patient Education  2017 [employer].  Fall Prevention in the Home Falls can cause injuries. They can happen to people of all ages. There are many things you can do to make your home safe and to help prevent falls. What can I do on the outside of my home? Regularly fix the edges of walkways and driveways and fix any cracks. Remove anything that might make you trip as you walk through a door, such as a raised step or threshold. Trim any bushes or trees on the path to your home. Use bright outdoor lighting. Clear any walking paths of anything that might make someone trip, such as rocks or tools. Regularly check to see if handrails are loose or broken. Make sure that both sides of any steps have handrails. Any raised decks and porches should have guardrails on the edges. Have any leaves, snow, or ice cleared regularly. Use sand or salt on walking paths during winter. Clean up any spills in your garage right away. This includes oil or grease spills. What can I do in the bathroom? Use night lights. Install grab  bars by the toilet and in the tub and shower. Do not use towel bars as grab bars. Use non-skid mats or decals in the tub or shower. If you need to sit down in the shower, use a plastic, non-slip stool. Keep the floor dry. Clean up any water that spills on the floor as soon as it happens. Remove soap buildup in the tub or shower regularly. Attach bath mats securely with double-sided non-slip rug tape. Do not have throw rugs and other things on the floor that can make you trip. What can I do in the bedroom? Use night lights. Make sure that you have a light by your bed that is easy to reach. Do not use any sheets or blankets that are too big for your bed. They should not hang down onto the floor. Have a firm chair that has side arms. You can use this for support while you get dressed. Do not have throw rugs and other things on the floor that can make you trip. What can I do in the kitchen? Clean up any spills right away. Avoid walking on wet  floors. Keep items that you use a lot in easy-to-reach places. If you need to reach something above you, use a strong step stool that has a grab bar. Keep electrical cords out of the way. Do not use floor polish or wax that makes floors slippery. If you must use wax, use non-skid floor wax. Do not have throw rugs and other things on the floor that can make you trip. What can I do with my stairs? Do not leave any items on the stairs. Make sure that there are handrails on both sides of the stairs and use them. Fix handrails that are broken or loose. Make sure that handrails are as long as the stairways. Check any carpeting to make sure that it is firmly attached to the stairs. Fix any carpet that is loose or worn. Avoid having throw rugs at the top or bottom of the stairs. If you do have throw rugs, attach them to the floor with carpet tape. Make sure that you have a light switch at the top of the stairs and the bottom of the stairs. If you do not have them,  ask someone to add them for you. What else can I do to help prevent falls? Wear shoes that: Do not have high heels. Have rubber bottoms. Are comfortable and fit you well. Are closed at the toe. Do not wear sandals. If you use a stepladder: Make sure that it is fully opened. Do not climb a closed stepladder. Make sure that both sides of the stepladder are locked into place. Ask someone to hold it for you, if possible. Clearly mark and make sure that you can see: Any grab bars or handrails. First and last steps. Where the edge of each step is. Use tools that help you move around (mobility aids) if they are needed. These include: Canes. Walkers. Scooters. Crutches. Turn on the lights when you go into a dark area. Replace any light bulbs as soon as they burn out. Set up your furniture so you have a clear path. Avoid moving your furniture around. If any of your floors are uneven, fix them. If there are any pets around you, be aware of where they are. Review your medicines with your doctor. Some medicines can make you feel dizzy. This can increase your chance of falling. Ask your doctor what other things that you can do to help prevent falls. This information is not intended to replace advice given to you by your health care provider. Make sure you discuss any questions you have with your health care provider. Document Released: 07/03/2009 Document Revised: 02/12/2016 Document Reviewed: 10/11/2014 Elsevier Interactive Patient Education  2017 [employer].

## 2023-04-07 NOTE — Progress Notes (Addendum)
Depression screening was completed on 04/05/23 with score of 0.

## 2023-04-08 ENCOUNTER — Other Ambulatory Visit: Payer: Medicare (Managed Care) | Admitting: Licensed Clinical Social Worker

## 2023-04-08 NOTE — Patient Outreach (Signed)
Care Coordination  04/08/2023  Alyssa Powell 1950/12/23 [id number]  [facility] spoke to patient's sister Gunnar Fusi and received HIPAA verifications successfully. Sister apologized for missing call and would like to reschedule this initial appointment with [facility] LCSW for 04/12/23 at 9:45 am.   Dickie La, BSW, MSW, LCSW Managed Medicaid LCSW [facility]  [facility] [email or web address] Phone: [phone]

## 2023-04-11 ENCOUNTER — Other Ambulatory Visit: Payer: Self-pay | Admitting: Family Medicine

## 2023-04-11 DIAGNOSIS — K219 Gastro-esophageal reflux disease without esophagitis: Secondary | ICD-10-CM

## 2023-04-12 ENCOUNTER — Other Ambulatory Visit: Payer: Medicare (Managed Care) | Admitting: Licensed Clinical Social Worker

## 2023-04-12 ENCOUNTER — Telehealth: Payer: Self-pay | Admitting: Internal Medicine

## 2023-04-12 DIAGNOSIS — F02818 Dementia in other diseases classified elsewhere, unspecified severity, with other behavioral disturbance: Secondary | ICD-10-CM

## 2023-04-12 NOTE — Telephone Encounter (Signed)
Per chart MD rx Rexulti../l,mb

## 2023-04-12 NOTE — Telephone Encounter (Signed)
Patient's sister Gunnar Fusi called and said that she and their mother think the patient was having a reaction to the medication Dr. Yetta Barre prescribed for dementia/anxiety. The patient was breaking out and itchy. Her sister said she does not know the name of the medication and got upset when asked. She said she does not live with them. She said they took her off the medication and put hydrocortisone cream on that relieved the itching. Gunnar Fusi would like a call back at [phone].

## 2023-04-12 NOTE — Patient Instructions (Signed)
Visit Information  Alyssa Powell was given information about Medicaid Managed Care team care coordination services and verbally consented to engagement with the [facility] Managed Care team.   Following is a copy of your plan of care:  Care Plan : LCSW Plan of Care  Updates made by Gustavus Bryant, LCSW since 04/12/2023 12:00 AM     Problem: Need for Financial POA      Goal: Advance Care Planning Optimized   Start Date: 04/12/2023  Priority: High  Note:   Priority: High  Timeframe:  Short Range Goal Priority:  High Start Date:   04/12/23             Expected End Date:  ongoing                     Follow Up Date--05/03/23 at 9 am  Current barriers:   Memory Deficits related to Dementia  Need for Financial POA Need for POA community resources Unable to write or read  Clinical Goals: Patient's sisters will work with agencies discussed to address needs related to managing care and gaining desired services  Clinical Interventions:  Inter-disciplinary care team collaboration (see longitudinal plan of care) Assessment completed with sisters [name] and [place]. [facility] LCSW spoke directly to patient and gained permission to discuss patient's care with her sisters. Patient is disabled and lives with her disabled brother and elderly mother. Sisters are main caretakers to patient.  Patient has a diagnosis of dementia and has an upcoming neurology appointment.  Clinical interventions provided:Solution-Focused Strategies, Active listening / Reflection utilized , Problem Solving [facility] ,  Offered referral to [facility] Team but family decline needing health care power of attorney resource connection but instead financial POA resource connection. Email sent to sister [name] with [employer]. Email included Health Care POA packet in case they desire to do this as well as this may not be able to be completed if patient's early onset dementia and cognitive state worsens.     Patient Goals/Self-Care Activities: Over the next 90 days Call [employer] and set up an appointment to discuss financial POA  Review Living Will document that was emailed to you Stay in close contact with PCP and care team Contact Insurance provider if needed  Patient Goals: Initial goal       24- Hour Availability:    [facility]  [address] [place], [place] [phone] Crisis [phone]   [facility] [phone]  [place] [facility]  [phone]    [facility]  [phone] (after hours)   Therapeutic Alternative/Mobile Crisis   [phone]   [place] National Suicide Hotline  508-058-5113 Len Childs) [place] 016   Call [phone] for mental health emergencies   [facility]  [phone]);  [place] and [employer]  [phone]); [place], [place], [place], [place], [place], [place], [place]    [facility] for [facility] Residents For 24/7 walk-up access to mental health services for [facility] children (4+), adolescents and adults, please visit the [facility] Center located at [address] in [place], [place].  *[place] also provides comprehensive outpatient behavioral health services in a variety of locations around the [place].  Connect With [place] [address] [place], [place] [address] HelpLine: (830)418-5200 or 1-(726)839-8728  Get Directions  Find Help 24/7 By Phone Call our 24-hour HelpLine at  218-145-6407 or [phone]) 200-4353 for immediate assistance for mental health and substance abuse issues.  Walk-In Help [place]: [facility] (Ages 4 and Up) [place]: Emergency Dept., [facility] Additional Resources National Hopeline Network: 1-800-SUICIDE The National Suicide Prevention Lifeline: 1-800-273-TALK     Dickie La, BSW, MSW, LCSW Managed Medicaid LCSW [facility]  [facility] [email or web address] Phone: [phone]

## 2023-04-12 NOTE — Patient Outreach (Signed)
Medicaid Managed Care Social Work Note  04/12/2023 Name:  Alyssa Powell MRN:  [id number] DOB:  04-Sep-1951  Alyssa Powell is an 72 y.o. year old female who is a primary patient of [name], MD.  The Medicaid Managed Care Coordination team was consulted for assistance with:  Community Resources   Alyssa Powell was given information about Medicaid Managed Care Coordination team services today. Alyssa Powell Patient and Primary Caregiver agreed to services and verbal consent obtained.  Engaged with patient  for by telephone forinitial visit in response to referral for case management and/or care coordination services.   Assessments/Interventions:  Review of past medical history, allergies, medications, health status, including review of consultants reports, laboratory and other test data, was performed as part of comprehensive evaluation and provision of chronic care management services.  SDOH: (Social Determinant of Health) assessments and interventions performed: SDOH Interventions    Flowsheet Row Patient Outreach Telephone from 04/12/2023 in [place] [facility] Clinical Support from 04/05/2023 in [facility] [place] [facility] at [facility] Clinical Support from 12/04/2021 in [facility] at [facility]  SDOH Interventions     Food Insecurity Interventions -- Intervention Not Indicated Intervention Not Indicated  Housing Interventions -- Intervention Not Indicated Other (Comment)  Alyssa Powell forward info to pt's provider.]  Transportation Interventions -- Intervention Not Indicated Retail banker  Utilities Interventions -- Intervention Not Indicated --  Financial Strain Interventions -- Intervention Not Indicated Intervention Not Indicated  Physical Activity Interventions -- -- Intervention Not Indicated  Stress Interventions Offered Alyssa Powell Patient Unable to Answer Patient Refused  [pt unable to answer]   Social Connections Interventions -- Intervention Not Indicated Intervention Not Indicated  Health Literacy Interventions -- Intervention Not Indicated --       Advanced Directives Status:  See Care Plan for related entries.  Care Plan                 Allergies  Allergen Reactions   Amoxicillin Hives   Codeine Phosphate Hives   Penicillins     Childhood allergy    Medications Reviewed Today     Reviewed by Tillie Rung, LPN (Licensed Practical Nurse) on 04/05/23 at 1331  Med List Status: <None>   Medication Order Taking? Sig Documenting Provider Last Dose Status Informant  albuterol (VENTOLIN HFA) 108 (90 Base) MCG/ACT inhaler [id number] No Inhale 1-2 puffs into the lungs every 6 (six) hours as needed for wheezing or shortness of breath. INHALE 1-2 PUFFS BY MOUTH EVERY 6 HOURS AS NEEDED FOR WHEEZE OR SHORTNESS OF Sheryle Hail, MD Taking Active   brexpiprazole (REXULTI) 0.25 MG TABS tablet [id number]  Take 1 tablet (0.25 mg total) by mouth daily. [name], MD  Active   budesonide (PULMICORT) 0.5 MG/2ML nebulizer solution [id number] No Take 2 mLs (0.5 mg total) by nebulization 2 (two) times daily. [name], MD Taking Active   cetirizine (ZYRTEC) 10 MG tablet [id number] No TAKE 1 TABLET BY MOUTH EVERYDAY AT BEDTIME [name], MD Taking Active   colchicine 0.6 MG tablet [id number] No TAKE 1 TABLET BY MOUTH TWICE A DAY [name], MD Taking Active   fluticasone (FLONASE) 50 MCG/ACT nasal spray [id number] No Place 2 sprays into both nostrils daily. [name], MD Taking Active   levalbuterol [name] 1.25 MG/3ML nebulizer solution [id number] No Take 1.25 mg by nebulization every 4 (four) hours as needed for wheezing. [name], MD Taking Active  meloxicam (MOBIC) 7.5 MG tablet [id number] No TAKE 1 TABLET (7.5 MG TOTAL) BY MOUTH DAILY AS NEEDED. FOR PAIN [name], MD Taking Active   pantoprazole (PROTONIX) 40 MG tablet [id number] No TAKE 1  TABLET BY MOUTH EVERY DAY [name], MD Taking Active   predniSONE (DELTASONE) 20 MG tablet [id number] No Take 2 tablets (40 mg total) by mouth daily with breakfast. Suezanne Jacquet, Vickie L, NP-C Taking Active   rosuvastatin (CRESTOR) 10 MG tablet [id number]  Take 1 tablet (10 mg total) by mouth daily. 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) 03/23/2023   Need for vaccination 07/04/2022   Encounter for general adult medical examination with abnormal findings 07/04/2022   Class 1 obesity due to excess calories with serious comorbidity and body mass index (BMI) of 34.0 to 34.9 in adult 06/28/2022   Flu vaccine need 06/28/2022   Screen for colon cancer 06/28/2022   Visit for screening mammogram 06/28/2022   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/28/2022   Hyperlipidemia with target LDL less than 130 06/28/2022   Moderate persistent asthma 09/05/2018   Essential hypertension 09/05/2018   Major depressive disorder with single episode 09/05/2018   Osteoarthritis, knee 12/16/2011   Allergic rhinitis 08/07/2009   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    Conditions to be addressed/monitored per PCP order:  Dementia  Care Plan : LCSW Plan of Care  Updates made by Gustavus Bryant, LCSW since 04/12/2023 12:00 AM     Problem: Need for Financial POA      Goal: Advance Care Planning Optimized   Start Date: 04/12/2023  Priority: High  Note:   Priority: High  Timeframe:  Short Range Goal Priority:  High Start Date:   04/12/23             Expected End Date:  ongoing                     Follow Up Date--05/03/23 at 9 am  Current barriers:   Memory Deficits  related to Dementia  Need for Financial POA Need for POA community resources Unable to write or read  Clinical Goals: Patient's sisters will work with agencies discussed to address needs related to managing care and gaining desired services  Clinical Interventions:  Inter-disciplinary care team collaboration (see longitudinal plan of care) Assessment completed with sisters [place] and [place]. [facility] LCSW spoke directly to patient and gained permission to discuss patient's care with her sisters. Patient is disabled and lives with her disabled brother and elderly mother. Sisters are main caretakers to patient.  Patient has a diagnosis of dementia and has an upcoming neurology appointment.  Clinical interventions provided:Solution-Focused Strategies, Active listening / Reflection utilized , Problem Solving [facility] ,  Offered referral to [facility] Team but family decline needing health care power of attorney resource connection but instead financial POA resource connection. Email sent to sister [name] with [employer]. Email included Health Care POA packet in case they desire to do this as well as this may not be able to be completed if patient's early onset  dementia and cognitive state worsens.    Patient Goals/Self-Care Activities: Over the next 90 days Call [employer] and set up an appointment to discuss financial POA  Review Living Will document that was emailed to you Stay in close contact with PCP and care team Contact Insurance provider if needed  Patient Goals: Initial goal     Follow up:  Patient agrees to Care Plan and Follow-up.  Plan: The Managed Medicaid care management team will reach out to the patient again over the next 30 days.  Dickie La, BSW, MSW, [employer] Managed Medicaid LCSW [facility]  [facility] [email or web address] Phone: [phone]

## 2023-04-12 NOTE — Telephone Encounter (Signed)
Called sister Gunnar Fusi) gave MD response. She states she will be seeing the neurologist 8/9.Marland KitchenRaechel Chute

## 2023-04-12 NOTE — Telephone Encounter (Signed)
Stop rexulti

## 2023-04-13 ENCOUNTER — Ambulatory Visit: Payer: Medicare (Managed Care) | Admitting: Internal Medicine

## 2023-04-28 ENCOUNTER — Other Ambulatory Visit: Payer: Self-pay | Admitting: Internal Medicine

## 2023-04-28 DIAGNOSIS — J4541 Moderate persistent asthma with (acute) exacerbation: Secondary | ICD-10-CM

## 2023-04-28 NOTE — Progress Notes (Addendum)
Assessment/Plan:    The patient is seen in neurologic consultation at the request of [name], MD for the evaluation of memory.  Alyssa Powell is a very pleasant 72 y.o. year old RH female  with special needs due to intellectual disability with a history of hypertension, hyperlipidemia, asthma,seen today for evaluation of memory loss.  MMSE was 9, however was unable to be completed as she has special needs so is not conclusive.  Memory workup is pending.  Of note, in the past, the patient was being evaluated at mental health facility, but she stopped attending after her therapist left.  Is important to go back to the therapist.  Her mood is addressed by her PCP.  Apparently, she was on Effexor for 75 mg 1 year ago, when her mother told her to discontinue the medication at once.  This may have triggered a rebound effect of the medicine, recommend that she be replaced on it.  Her PCP tried  Rexulti for episodes of agitation, and she developed a rash.  She has an appointment soon, and other agents may be tried.    Memory concerns with behavioral disturbance Intellectual Disability  MRI brain without contrast to assess for underlying structural abnormality and assess vascular load  Recommend restarting Effexor 75 mg in the near future for depression. Continue to control mood as per PCP, as mentioned above, she was unable to tolerate Rexulti, may consider other agents soon Recommend restarting attendance to special needs counseling Patient lives at home with her mother at this time, which increases her anxiety, family is entertaining other living arrangements including memory care. Check B12  Because of the MRI, we will place a referral to ENT for vertigo (suspecting BPPV) Recommend good control of cardiovascular risk factors.   Folllow up in 3 months  Subjective:    The patient is accompanied by her sister who supplements the history.    How long did patient have memory difficulties?  Sister reports issues for about 1 year, as family noted episodes of agitation and tangential conversations.  She has a history of special needs, and her sister reports "she had all her life however she was more functional, when she gave birth to her children, and then moved with her mother, her memory is now worse ".  repeats oneself?  Endorsed Disoriented when walking into a room?  Patient denies   Leaving objects in unusual places?  denies   Wandering behavior? Denies She may walk away when arguing with her mother . Any personality changes ?  These episodes are apparently triggered by her mother or brother. She has recently discontinued Rexulti due to rashes and itching with the med.   Any history of depression?:  There is an appointment of depression according to sister but "maybe is anxiety". She was taking Zoloft 75 mg during arguments she stopped it altogether by her mother. Hallucinations or paranoia? Sometimes she talks to herself. Sometimes she sees a little girl on top of the building, or some other children.  Seizures? denies    Any sleep changes?  Sleeps well Denies vivid dreams, REM behavior or sleepwalking   Sleep apnea? denies   Any hygiene concerns?  denies   Independent of bathing and dressing? Endorsed  Does the patient need help with medications? Mother  is in charge   Who is in charge of the finances?  Mother is in charge     Any changes in appetite?   She forgets that she  ate and eats again.     Patient have trouble swallowing?  denies   Does the patient cook? No  Any headaches?  denies   Chronic pain? denies   Ambulates with difficulty? For the last 6 months her stability got worse.  Needs a walker to ambulate for stability.  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  She has never been evaluated for it. Recent falls or head injuries? denies     Vision changes? Unilateral weakness, numbness or tingling? denies   Any tremors? denies    Any anosmia? denies   Any incontinence of urine? Endorsed uses pullups  Any bowel dysfunction? denies      Patient lives with mother and brother   History of heavy alcohol intake? denies   History of heavy tobacco use? denies   Family history of dementia? MGM , 3 maternal aunts with dementia  Does patient drive? No    Allergies  Allergen Reactions   Amoxicillin Hives   Codeine Phosphate Hives   Penicillins     Childhood allergy    Current Outpatient Medications  Medication Instructions   budesonide (PULMICORT) 0.5 mg, Nebulization, 2 times daily   cetirizine (ZYRTEC) 10 MG tablet TAKE 1 TABLET BY MOUTH EVERYDAY AT BEDTIME   colchicine 0.6 mg, Oral, 2 times daily   fluticasone (FLONASE) 50 MCG/ACT nasal spray 2 sprays, Each Nare, Daily   levalbuterol (XOPENEX) 1.25 mg, Nebulization, Every 4 hours PRN   meloxicam (MOBIC) 7.5 mg, Oral, Daily PRN, for pain   memantine (NAMENDA) 5 MG tablet Take 1 tablet (5 mg at night) for 2 weeks, then increase to 1 tablet (5 mg) twice a day   pantoprazole (PROTONIX) 40 mg, Oral, Daily   predniSONE (DELTASONE) 40 mg, Oral, Daily with breakfast   rosuvastatin (CRESTOR) 10 mg, Oral, Daily   sertraline (ZOLOFT) 50 MG tablet TAKE 1 AND 1/2 TABLETS BY MOUTH DAILY   VENTOLIN HFA 108 (90 Base) MCG/ACT inhaler INHALE 1-2 PUFFS BY MOUTH EVERY 6 HOURS AS NEEDED FOR WHEEZE OR SHORTNESS OF BREATH     VITALS:   Vitals:   04/29/23 1305 04/29/23 1439  BP: (!) 152/81 (!) 141/87  Pulse: 72   Resp: 18   SpO2: 98%   Weight: 205 lb (93 kg)   Height: 5\' 7"  (1.702 m)        PHYSICAL EXAM   HEENT:  Normocephalic, atraumatic.  The superficial temporal arteries are without ropiness or tenderness. Cardiovascular: Regular rate and rhythm. Lungs: Clear to auscultation bilaterally. Neck: There are no carotid bruits noted bilaterally.  NEUROLOGICAL:     No data to display             04/29/2023    6:00 PM 09/09/2020    2:37 PM 10/12/2018    2:58 PM   MMSE - Mini Mental State Exam  Not completed: -- Unable to complete   Orientation to time 2  2  Orientation to Place 0  3  Registration 0  2  Attention/ Calculation 0  0  Recall 0  0  Language- name 2 objects 2  2  Language- repeat 1  0  Language- follow 3 step command 3  3  Language- read & follow direction 1  0  Write a sentence 0  0  Copy design 0  0  Total score 9  12     Orientation:  Alert and oriented to person, not to  place or time. No aphasia or dysarthria. Fund of knowledge is appropriate. Recent and remote memory impaired.  Attention and concentration are reduced.  Able to name objects and able to repeat phrases. Delayed recall 0-3 Cranial nerves: There is good facial symmetry. Extraocular muscles are intact and visual fields are full to confrontational testing. Speech is fluent and clear. No tongue deviation. Hearing is intact to conversational tone.  Tone: Tone is good throughout. Sensation: Sensation is intact to light touch.  Vibration is intact at the bilateral big toe.  Coordination: The patient has some difficulty with RAM's or FNF bilaterally. Normal finger to nose  Motor: Strength is 5/5 in the bilateral upper and lower extremities. There is no pronator drift. There are no fasciculations noted. DTR's: Deep tendon reflexes are 2/4 bilaterally. Gait and Station: The patient is able to ambulate without difficulty. Gait is cautious and narrow. Stride length is short,affected by  genu valgo   Thank you for allowing [place] the opportunity to participate in the care of this nice patient. Please do not hesitate to contact us for any questions or concerns.   Total time spent on today's visit was 60 minutes dedicated to this patient today, preparing to see patient, examining the patient, ordering tests and/or medications and counseling the patient, documenting clinical information in the EHR or other health record, independently interpreting results and communicating results to  the patient/family, discussing treatment and goals, answering patient's questions and coordinating care.  Cc:  [name], MD  Marlowe Kays 04/29/2023 6:23 PM

## 2023-04-29 ENCOUNTER — Ambulatory Visit: Payer: Medicare (Managed Care)

## 2023-04-29 ENCOUNTER — Other Ambulatory Visit (INDEPENDENT_AMBULATORY_CARE_PROVIDER_SITE_OTHER): Payer: Medicare (Managed Care)

## 2023-04-29 ENCOUNTER — Encounter: Payer: Self-pay | Admitting: Physician Assistant

## 2023-04-29 ENCOUNTER — Other Ambulatory Visit: Payer: Self-pay | Admitting: Family Medicine

## 2023-04-29 ENCOUNTER — Ambulatory Visit (INDEPENDENT_AMBULATORY_CARE_PROVIDER_SITE_OTHER): Payer: Medicare (Managed Care) | Admitting: Physician Assistant

## 2023-04-29 ENCOUNTER — Other Ambulatory Visit: Payer: Self-pay | Admitting: Internal Medicine

## 2023-04-29 VITALS — BP 141/87 | HR 72 | Resp 18 | Ht 67.0 in | Wt 205.0 lb

## 2023-04-29 DIAGNOSIS — R413 Other amnesia: Secondary | ICD-10-CM | POA: Diagnosis not present

## 2023-04-29 DIAGNOSIS — F70 Mild intellectual disabilities: Secondary | ICD-10-CM

## 2023-04-29 DIAGNOSIS — K219 Gastro-esophageal reflux disease without esophagitis: Secondary | ICD-10-CM

## 2023-04-29 DIAGNOSIS — F02818 Dementia in other diseases classified elsewhere, unspecified severity, with other behavioral disturbance: Secondary | ICD-10-CM

## 2023-04-29 DIAGNOSIS — M1711 Unilateral primary osteoarthritis, right knee: Secondary | ICD-10-CM

## 2023-04-29 DIAGNOSIS — F325 Major depressive disorder, single episode, in full remission: Secondary | ICD-10-CM

## 2023-04-29 MED ORDER — MEMANTINE HCL 5 MG PO TABS
5.0000 mg | ORAL_TABLET | Freq: Two times a day (BID) | ORAL | 11 refills | Status: DC
  Filled 2023-09-07 – 2023-10-25 (×3): qty 60, 30d supply, fill #0
  Filled 2024-01-16: qty 60, 30d supply, fill #1
  Filled 2024-02-09: qty 60, 30d supply, fill #2
  Filled 2024-03-10: qty 60, 30d supply, fill #3
  Filled 2024-04-09: qty 60, 30d supply, fill #4

## 2023-04-29 NOTE — Patient Instructions (Addendum)
It was a pleasure to see you today at our office.   Recommendations:    MRI of the brain, the radiology office will call you to arrange you appointment   Check labs today  B12  Start Memantine 5 mg: Take 1 tablet (5 mg at night) for 2 weeks, then increase to 1 tablet (5 mg) twice a day   Follow up in 3 months Recommend talking with the SW regarding resuming mental health visit  Recommend at some point restarting Zoloft for mild depression, because she discontinued the med at once which could have "backfired the symptoms" Recommend follow with PCP the agitation   For psychiatric meds, mood meds: Please have your primary care physician manage these medications.  If you have any severe symptoms of a stroke, or other severe issues such as confusion,severe chills or fever, etc call 911 or go to the ER as you may need to be evaluated further     For assessment of decision of mental capacity and competency:  Call Dr. Erick Blinks, geriatric psychiatrist at [phone]  Counseling regarding caregiver distress, including caregiver depression, anxiety and issues regarding community resources, adult day care programs, adult living facilities, or memory care questions:  please contact your  Primary Doctor's Social Worker   Whom to call: Memory  decline, memory medications: Call our office [phone]    [email or web address]   RECOMMENDATIONS FOR ALL PATIENTS WITH MEMORY PROBLEMS: 1. Continue to exercise (Recommend 30 minutes of walking everyday, or 3 hours every week) 2. Increase social interactions - continue going to [place] and enjoy social gatherings with friends and family 3. Eat healthy, avoid fried foods and eat more fruits and vegetables 4. Maintain adequate blood pressure, blood sugar, and blood cholesterol level. Reducing the risk of stroke and cardiovascular disease also helps promoting better memory. 5. Avoid stressful  situations. Live a simple life and avoid aggravations. Organize your time and prepare for the next day in anticipation. 6. Sleep well, avoid any interruptions of sleep and avoid any distractions in the bedroom that may interfere with adequate sleep quality 7. Avoid sugar, avoid sweets as there is a strong link between excessive sugar intake, diabetes, and cognitive impairment We discussed the Mediterranean diet, which has been shown to help patients reduce the risk of progressive memory disorders and reduces cardiovascular risk. This includes eating fish, eat fruits and green leafy vegetables, nuts like almonds and hazelnuts, walnuts, and also use olive oil. Avoid fast foods and fried foods as much as possible. Avoid sweets and sugar as sugar use has been linked to worsening of memory function.  There is always a concern of gradual progression of memory problems. If this is the case, then we may need to adjust level of care according to patient needs. Support, both to the patient and caregiver, should then be put into place.      You have been referred for a neuropsychological evaluation (i.e., evaluation of memory and thinking abilities). Please bring someone with you to this appointment if possible, as it is helpful for the doctor to hear from both you and another adult who knows you well. Please bring eyeglasses and hearing aids if you wear them.    The evaluation will take approximately 3 hours and has two parts:   The first part is a clinical interview with the neuropsychologist (Dr. Milbert Coulter or Dr. Roseanne Reno). During the interview, the neuropsychologist will speak with you and the individual you brought to the appointment.    The  second part of the evaluation is testing with the doctor's technician Annabelle Harman or Selena Batten). During the testing, the technician will ask you to remember different types of material, solve problems, and answer some questionnaires. Your family member will not be present for this  portion of the evaluation.   Please note: We must reserve several hours of the neuropsychologist's time and the psychometrician's time for your evaluation appointment. As such, there is a No-Show fee of $100. If you are unable to attend any of your appointments, please contact our office as soon as possible to reschedule.      DRIVING: Regarding driving, in patients with progressive memory problems, driving will be impaired. We advise to have someone else do the driving if trouble finding directions or if minor accidents are reported. Independent driving assessment is available to determine safety of driving.   If you are interested in the driving assessment, you can contact the following:  [employer] in [place] [phone]  [facility] [phone]  [facility] [phone]  [facility] [phone] or [phone]   FALL PRECAUTIONS: Be cautious when walking. Scan the area for obstacles that may increase the risk of trips and falls. When getting up in the mornings, sit up at the edge of the bed for a few minutes before getting out of bed. Consider elevating the bed at the head end to avoid drop of blood pressure when getting up. Walk always in a well-lit room (use night lights in the walls). Avoid area rugs or power cords from appliances in the middle of the walkways. Use a walker or a cane if necessary and consider physical therapy for balance exercise. Get your eyesight checked regularly.  FINANCIAL OVERSIGHT: Supervision, especially oversight when making financial decisions or transactions is also recommended.  HOME SAFETY: Consider the safety of the kitchen when operating appliances like stoves, microwave oven, and blender. Consider having supervision and share cooking responsibilities until no longer able to participate in those. Accidents with firearms and other hazards in the house should be identified and addressed as  well.   ABILITY TO BE LEFT ALONE: If patient is unable to contact 911 operator, consider using LifeLine, or when the need is there, arrange for someone to stay with patients. Smoking is a fire hazard, consider supervision or cessation. Risk of wandering should be assessed by caregiver and if detected at any point, supervision and safe proof recommendations should be instituted.  MEDICATION SUPERVISION: Inability to self-administer medication needs to be constantly addressed. Implement a mechanism to ensure safe administration of the medications.      Mediterranean Diet A Mediterranean diet refers to food and lifestyle choices that are based on the traditions of countries located on the [employer] [place]. This way of eating has been shown to help prevent certain conditions and improve outcomes for people who have chronic diseases, like kidney disease and heart disease. What are tips for following this plan? Lifestyle  Cook and eat meals together with your family, when possible. Drink enough fluid to keep your urine clear or pale yellow. Be physically active every day. This includes: Aerobic exercise like running or swimming. Leisure activities like gardening, walking, or housework. Get 7-8 hours of sleep each night. If recommended by your health care provider, drink red wine in moderation. This means 1 glass a day for nonpregnant women and 2 glasses a day for men. A glass of wine equals 5 oz (150 mL). Reading food labels  Check the serving size of  packaged foods. For foods such as rice and pasta, the serving size refers to the amount of cooked product, not dry. Check the total fat in packaged foods. Avoid foods that have saturated fat or trans fats. Check the ingredients list for added sugars, such as corn syrup. Shopping  At the grocery store, buy most of your food from the areas near the walls of the store. This includes: Fresh fruits and vegetables (produce). Grains, beans, nuts, and  seeds. Some of these may be available in unpackaged forms or large amounts (in bulk). Fresh seafood. Poultry and eggs. Low-fat dairy products. Buy whole ingredients instead of prepackaged foods. Buy fresh fruits and vegetables in-season from local farmers markets. Buy frozen fruits and vegetables in resealable bags. If you do not have access to quality fresh seafood, buy precooked frozen shrimp or canned fish, such as tuna, salmon, or sardines. Buy small amounts of raw or cooked vegetables, salads, or olives from the deli or salad bar at your store. Stock your pantry so you always have certain foods on hand, such as olive oil, canned tuna, canned tomatoes, rice, pasta, and beans. Cooking  Cook foods with extra-virgin olive oil instead of using butter or other vegetable oils. Have meat as a side dish, and have vegetables or grains as your main dish. This means having meat in small portions or adding small amounts of meat to foods like pasta or stew. Use beans or vegetables instead of meat in common dishes like chili or lasagna. Experiment with different cooking methods. Try roasting or broiling vegetables instead of steaming or sauteing them. Add frozen vegetables to soups, stews, pasta, or rice. Add nuts or seeds for added healthy fat at each meal. You can add these to yogurt, salads, or vegetable dishes. Marinate fish or vegetables using olive oil, lemon juice, garlic, and fresh herbs. Meal planning  Plan to eat 1 vegetarian meal one day each week. Try to work up to 2 vegetarian meals, if possible. Eat seafood 2 or more times a week. Have healthy snacks readily available, such as: Vegetable sticks with hummus. Greek yogurt. Fruit and nut trail mix. Eat balanced meals throughout the week. This includes: Fruit: 2-3 servings a day Vegetables: 4-5 servings a day Low-fat dairy: 2 servings a day Fish, poultry, or lean meat: 1 serving a day Beans and legumes: 2 or more servings a week Nuts  and seeds: 1-2 servings a day Whole grains: 6-8 servings a day Extra-virgin olive oil: 3-4 servings a day Limit red meat and sweets to only a few servings a month What are my food choices? Mediterranean diet Recommended Grains: Whole-grain pasta. Brown rice. Bulgar wheat. Polenta. Couscous. Whole-wheat bread. Orpah Cobb. Vegetables: Artichokes. Beets. Broccoli. Cabbage. Carrots. Eggplant. Green beans. Chard. Kale. Spinach. Onions. Leeks. Peas. Squash. Tomatoes. Peppers. Radishes. Fruits: Apples. Apricots. Avocado. Berries. Bananas. Cherries. Dates. Figs. Grapes. Lemons. Melon. Oranges. Peaches. Plums. Pomegranate. Meats and other protein foods: Beans. Almonds. Sunflower seeds. Pine nuts. Peanuts. Cod. Salmon. Scallops. Shrimp. Tuna. Tilapia. Clams. Oysters. Eggs. Dairy: Low-fat milk. Cheese. Greek yogurt. Beverages: Water. Red wine. Herbal tea. Fats and oils: Extra virgin olive oil. Avocado oil. Grape seed oil. Sweets and desserts: [place] yogurt with honey. Baked apples. Poached pears. Trail mix. Seasoning and other foods: Basil. Cilantro. Coriander. Cumin. Mint. Parsley. Sage. Rosemary. Tarragon. Garlic. Oregano. Thyme. Pepper. Balsalmic vinegar. Tahini. Hummus. Tomato sauce. Olives. Mushrooms. Limit these Grains: Prepackaged pasta or rice dishes. Prepackaged cereal with added sugar. Vegetables: Deep fried potatoes (french fries). Fruits:  Fruit canned in syrup. Meats and other protein foods: Beef. Pork. Lamb. Poultry with skin. Hot dogs. Tomasa Blase. Dairy: Ice cream. Sour cream. Whole milk. Beverages: Juice. Sugar-sweetened soft drinks. Beer. Liquor and spirits. Fats and oils: Butter. Canola oil. Vegetable oil. Beef fat (tallow). Lard. Sweets and desserts: Cookies. Cakes. Pies. Candy. Seasoning and other foods: Mayonnaise. Premade sauces and marinades. The items listed may not be a complete list. Talk with your dietitian about what dietary choices are right for you. Summary The  Mediterranean diet includes both food and lifestyle choices. Eat a variety of fresh fruits and vegetables, beans, nuts, seeds, and whole grains. Limit the amount of red meat and sweets that you eat. Talk with your health care provider about whether it is safe for you to drink red wine in moderation. This means 1 glass a day for nonpregnant women and 2 glasses a day for men. A glass of wine equals 5 oz (150 mL). This information is not intended to replace advice given to you by your health care provider. Make sure you discuss any questions you have with your health care provider. Document Released: 04/29/2016 Document Revised: 06/01/2016 Document Reviewed: 04/29/2016 Elsevier Interactive Patient Education  2017 [employer].      Labs today [address] Mri at [facility]

## 2023-05-02 NOTE — Progress Notes (Signed)
B12 and thyroid levels are normal, thanks

## 2023-05-03 ENCOUNTER — Other Ambulatory Visit: Payer: Medicare (Managed Care) | Admitting: Licensed Clinical Social Worker

## 2023-05-03 NOTE — Patient Outreach (Signed)
Medicaid Managed Care Social Work Note  05/03/2023 Name:  Aryahna Passi MRN:  [id number] DOB:  1950/11/17  Raechelle Feurtado is an 72 y.o. year old female who is a primary patient of [name], MD.  The Medicaid Managed Care Coordination team was consulted for assistance with:  Mental Health Counseling and Resources  Ms. Tyree was given information about Medicaid Managed Care Coordination team services today. [name] Primary Caregiver agreed to services and verbal consent obtained.  Engaged with patient  for by telephone forfollow up visit in response to referral for case management and/or care coordination services.   Assessments/Interventions:  Review of past medical history, allergies, medications, health status, including review of consultants reports, laboratory and other test data, was performed as part of comprehensive evaluation and provision of chronic care management services.  SDOH: (Social Determinant of Health) assessments and interventions performed: SDOH Interventions    Flowsheet Row Patient Outreach Telephone from 05/03/2023 in [place] POPULATION HEALTH DEPARTMENT Patient Outreach Telephone from 04/12/2023 in [place] POPULATION HEALTH DEPARTMENT Clinical Support from 04/05/2023 in [facility] Clinical Support from 12/04/2021 in [facility] at [facility]  SDOH Interventions      Food Insecurity Interventions -- -- Intervention Not Indicated Intervention Not Indicated  Housing Interventions -- -- Intervention Not Indicated Other (Comment)  [will forward info to pt's provider.]  Transportation Interventions -- -- Intervention Not Indicated Retail banker  Utilities Interventions -- -- Intervention Not Indicated --  Financial Strain Interventions -- -- Intervention Not Indicated Intervention Not Indicated  Physical Activity Interventions -- -- -- Intervention Not Indicated  Stress Interventions Offered  [employer], Provide Counseling  [Sister [place] had recent surgery so Sister Gunnar Fusi is doing it all for entire family and is experiencing caregiver burnout] [employer], Provide Counseling Patient Unable to Answer Patient Refused  [pt unable to answer]  Social Connections Interventions -- -- Intervention Not Indicated Intervention Not Indicated  Health Literacy Interventions -- -- Intervention Not Indicated --       Advanced Directives Status:  See Care Plan for related entries.  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) 03/23/2023   Need for vaccination 07/04/2022   Encounter for general adult medical examination with abnormal findings 07/04/2022   Class 1 obesity due to excess calories with serious comorbidity and body mass index (BMI) of 34.0 to 34.9 in adult 06/28/2022   Flu vaccine need 06/28/2022   Screen for colon cancer 06/28/2022   Visit for screening mammogram 06/28/2022   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/28/2022   Hyperlipidemia with target LDL less than 130 06/28/2022   Moderate persistent asthma 09/05/2018   Essential hypertension 09/05/2018   Major depressive disorder with single episode 09/05/2018   Osteoarthritis, knee 12/16/2011   Allergic rhinitis 08/07/2009   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    Care Plan : LCSW Plan of Care  Updates made by Gustavus Bryant, LCSW since 05/03/2023 12:00  AM     Problem: Need for Financial POA      Goal: Advance Care Planning Optimized   Start Date: 04/12/2023  Priority:  High  Note:   Priority: High  Timeframe:  Short Range Goal Priority:  High Start Date:   04/12/23             Expected End Date:  ongoing                     Follow Up Date--05/25/23 at 9 am  Current barriers:   Memory Deficits related to Dementia  Need for Financial POA Need for POA community resources Unable to write or read  Clinical Goals: Patient's sisters will work with agencies discussed to address needs related to managing care and gaining desired services  Clinical Interventions:  Inter-disciplinary care team collaboration (see longitudinal plan of care) Assessment completed with sisters [name] and [place]. [facility] LCSW spoke directly to patient and gained permission to discuss patient's care with her sisters. Patient is disabled and lives with her disabled brother and elderly mother. Sisters are main caretakers to patient.  Patient has a diagnosis of dementia and has an upcoming neurology appointment.  Clinical interventions provided:Solution-Focused Strategies, Active listening / Reflection utilized , Problem Solving [facility] ,  Offered referral to [facility] Team but family decline needing health care power of attorney resource connection but instead financial POA resource connection. Email sent to sister [name] with [employer]. Email included Health Care POA packet in case they desire to do this as well as this may not be able to be completed if patient's early onset dementia and cognitive state worsens.  Update- Patient's secondary caregiver [name] had a surgery last week which has placed [place] as the primary caregiver for entire family (patient, brother and sister). Caregiver prevention burnout Associate Professor education and emotional support provided. Family reports no current crises but admit that they have not been able to pursue the resources that [facility] LCSW sent them due being overwhelmed with daily task and constant care taking. [facility] LCSW will follow  up on in a few weeks to check back in.    Patient Goals/Self-Care Activities: Over the next 90 days Call [employer] and set up an appointment to discuss financial POA  Review Living Will document that was emailed to you Stay in close contact with PCP and care team Contact Insurance provider if needed  Patient Goals: Follow up goal     Follow up:  Patient agrees to Care Plan and Follow-up.  Plan: The Managed Medicaid care management team will reach out to the patient again over the next 30 days.  Dickie La, BSW, MSW, [employer] Managed Medicaid LCSW [facility]  [facility] [email or web address] Phone: [phone]

## 2023-05-03 NOTE — Patient Instructions (Signed)
Visit Information  Thank you for taking time to visit with me today. Please don't hesitate to contact me if I can be of assistance to you.   If you are experiencing a Mental Health or Behavioral Health Crisis or need someone to talk to, please call the Suicide and Crisis Lifeline: 988 call the [place] National Suicide Prevention Lifeline: 217-523-2764 or TTY: 843-195-0993 TTY (208)384-0641) to talk to a trained counselor call 1-800-273-TALK (toll free, 24 hour hotline) go to [facility] [address] [place] [phone])   Dickie La, BSW, MSW, LCSW Managed Medicaid LCSW [facility]  [facility] [email or web address] Phone: [phone]

## 2023-05-25 ENCOUNTER — Other Ambulatory Visit: Payer: Medicare (Managed Care) | Admitting: Licensed Clinical Social Worker

## 2023-05-25 NOTE — Patient Outreach (Signed)
Medicaid Managed Care Social Work Note  05/25/2023 Name:  Oral Robling MRN:  [id number] DOB:  1950/10/26  Janace Gruttadauria is an 72 y.o. year old female who is a primary patient of [name], MD.  The Medicaid Managed Care Coordination team was consulted for assistance with:  Community Resources   Ms. Schieferstein was given information about Medicaid Managed Care Coordination team services today. [name] Patient and Primary Caregiver agreed to services and verbal consent obtained. Gunnar Fusi (sister) and patient provided consent today.   Engaged with patient  for by telephone forfollow up visit in response to referral for case management and/or care coordination services.   Assessments/Interventions:  Review of past medical history, allergies, medications, health status, including review of consultants reports, laboratory and other test data, was performed as part of comprehensive evaluation and provision of chronic care management services.  SDOH: (Social Determinant of Health) assessments and interventions performed: SDOH Interventions    Flowsheet Row Patient Outreach Telephone from 05/25/2023 in [place] POPULATION HEALTH DEPARTMENT Patient Outreach Telephone from 05/03/2023 in [place] POPULATION HEALTH DEPARTMENT Patient Outreach Telephone from 04/12/2023 in [place] POPULATION HEALTH DEPARTMENT Clinical Support from 04/05/2023 in [facility] Clinical Support from 12/04/2021 in [facility] at [facility]  SDOH Interventions       Food Insecurity Interventions -- -- -- Intervention Not Indicated Intervention Not Indicated  Housing Interventions -- -- -- Intervention Not Indicated Other (Comment)  [will forward info to pt's provider.]  Transportation Interventions -- -- -- Intervention Not Indicated [employer]  Utilities Interventions -- -- -- Intervention Not Indicated --  Financial Strain Interventions -- -- --  Intervention Not Indicated Intervention Not Indicated  Physical Activity Interventions -- -- -- -- Intervention Not Indicated  Stress Interventions Offered [employer], Provide Counseling  [Due to keeping up with pt's multiple medical appointments and sister's Sunny recent surgery who is a secondary caregiver] [employer], Provide Counseling  [Sister Sunny had recent surgery so Sister Gunnar Fusi is doing it all for entire family and is experiencing caregiver burnout] [employer], Provide Counseling Patient Unable to Answer Patient Refused  [pt unable to answer]  Social Connections Interventions -- -- -- Intervention Not Indicated Intervention Not Indicated  Health Literacy Interventions -- -- -- Intervention Not Indicated --       Advanced Directives Status:  See Care Plan for related entries.  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) 03/23/2023   Need for vaccination 07/04/2022   Encounter for general adult medical examination with abnormal findings 07/04/2022   Class 1 obesity due to excess calories with serious comorbidity and body mass index (BMI) of 34.0 to 34.9 in adult 06/28/2022   Flu vaccine need 06/28/2022   Screen for colon cancer 06/28/2022   Visit for screening mammogram 06/28/2022   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/28/2022   Hyperlipidemia with target LDL less than 130 06/28/2022   Moderate  persistent asthma 09/05/2018   Essential hypertension 09/05/2018   Major depressive disorder with single episode  09/05/2018   Osteoarthritis, knee 12/16/2011   Allergic rhinitis 08/07/2009   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   Current barriers:   Memory Deficits related to Dementia  Need for Financial POA Need for POA community resources Unable to write or read  Clinical Goals: Patient's sisters will work with agencies discussed to address needs related to managing care and gaining desired services  Clinical Interventions:  Inter-disciplinary care team collaboration (see longitudinal plan of care) Assessment completed with sisters [name] and [place]. [facility] LCSW spoke directly to patient and gained permission to discuss patient's care with her sisters. Patient is disabled and lives with her disabled brother and elderly mother. Sisters are main caretakers to patient.  Patient has a diagnosis of dementia and has an upcoming neurology appointment.  Clinical interventions provided:Solution-Focused Strategies, Active listening / Reflection utilized , Problem Solving [facility] ,  Offered referral to [facility] Team but family decline needing health care power of attorney resource connection but instead financial POA resource connection. Email sent to sister [name] with [employer]. Email included Health Care POA packet in case they desire to do this as well as this may not be able to be completed if patient's early onset dementia and cognitive state worsens.  Update- Patient's secondary caregiver [name] had a surgery  last week which has placed [place] as the primary caregiver for entire family (patient, brother and sister). Caregiver prevention burnout Associate Professor education and emotional support provided. Family reports no current crises but admit that they have not been able to pursue the resources that [facility] LCSW sent them due being overwhelmed with daily task and constant care taking. [facility] LCSW will follow up on in a few weeks to check back in.  Update- Family report that they are waiting to go to the [employer] once patient and family's health concerns are more manageable. Gunnar Fusi reports that her sister [name] is still recovering from her recent surgery. Gunnar Fusi and [place] are the two primary caregivers to patient and patient's brother. Gunnar Fusi is requesting [facility] nursing involvement as she has medical questions regarding patient's need for a colonoscopy and MRI. Referral made and family will be contacted next week to assist with these questions. Family is agreeable to social work case closure at this time as they deny any further social work needs. However, family knows to contact this [facility] LCSW in the future if ever needed.   Patient Goals/Self-Care Activities: Over the next 90 days Call [employer] and set up an appointment to discuss financial POA  Review Living Will document that was emailed to you Stay in close contact with PCP and care team [employer] provider if  needed  Patient Goals: Discharge goal     Follow up:  Patient agrees to Care Plan and Follow-up.  Plan: The Managed Medicaid care management team will reach out to the patient again over the next 30 days.  Dickie La, BSW, MSW, [employer] Managed Medicaid LCSW [facility]  [email or web address] Phone: [phone]

## 2023-05-25 NOTE — Patient Instructions (Signed)
Visit Information  Ms. Clearance Coots and primary care giver Gunnar Fusi (sister) was given information about Medicaid Managed Care team care coordination services and verbally consented to engagement with the [facility] Managed Care team.   RN Care Manager will contact you next week on 06/01/23 at 11 am to discuss the questions you had regarding patient's health  Following is a copy of your plan of care:  Care Plan : LCSW Plan of Care  Updates made by Gustavus Bryant, LCSW since 05/25/2023 12:00 AM     Problem: Need for Financial POA      Goal: Advance Care Planning Optimized   Start Date: 04/12/2023  Priority: High  Note:   Priority: High  Timeframe:  Short Range Goal Priority:  High Start Date:   04/12/23             Expected End Date:  ongoing                     Follow Up Date--06/01/23 at 11 am with [facility] RNCM, [facility] LCSW will close case today.   Current barriers:   Memory Deficits related to Dementia  Need for Financial POA Need for POA community resources Unable to write or read  Clinical Goals: Patient's sisters will work with agencies discussed to address needs related to managing care and gaining desired services  Patient Goals/Self-Care Activities: Over the next 90 days Call [employer] and set up an appointment to discuss financial POA  Review Living Will document that was emailed to you Stay in close contact with PCP and care team Contact Insurance provider if needed  Patient Goals: Discharge goal    Dickie La, BSW, MSW, [employer] Managed Medicaid LCSW [place]  [facility] [employer] [email or web address] Phone: [phone]

## 2023-05-26 ENCOUNTER — Telehealth: Payer: Self-pay | Admitting: Internal Medicine

## 2023-05-26 NOTE — Telephone Encounter (Signed)
Pt sister called back needing to rescheduled her sister appointment and she is inquiring about making appointment for her self on the same day. This is for the mammogram appts.

## 2023-06-01 ENCOUNTER — Other Ambulatory Visit: Payer: Medicare (Managed Care) | Admitting: Obstetrics and Gynecology

## 2023-06-01 ENCOUNTER — Encounter: Payer: Self-pay | Admitting: Obstetrics and Gynecology

## 2023-06-01 NOTE — Patient Instructions (Signed)
Visit Information  [name] / [name] was given information about Medicaid Managed Care team care coordination services and verbally consented to engagement with the [facility] Managed Care team.   [name] / [name]- following are the goals we discussed in your visit today:   Goals Addressed    Timeframe:  Long-Range Goal Priority:  High Start Date: 06/01/23                            Expected End Date:   ongoing                    Follow Up Date 06/28/23    - schedule appointment for vaccines needed due to my age or health - schedule recommended health tests (blood work, mammogram, colonoscopy, pap test) - schedule and keep appointment for annual check-up    Why is this important?   Screening tests can find diseases early when they are easier to treat.  Your doctor or nurse will talk with you about which tests are important for you.  Getting shots for common diseases like the flu and shingles will help prevent them.    The patient / DPR verbalized understanding of instructions, educational materials, and care plan provided today and DECLINED offer to receive copy of patient instructions, educational materials, and care plan.   The Managed Medicaid care management team will reach out to the patient / DPR again over the next 30 business  days.  The  [name] (DPR) has been provided with contact information for the Managed Medicaid care management team and has been advised to call with any health related questions or concerns.   Following is a copy of your plan of care:  Care Plan : RN Care Manager Plan of Care  Updates made by [name], RN since 06/01/2023 12:00 AM     Problem: Health Promotion or Disease Self-Management (General Plan of Care)      Long-Range Goal: Chronic Disease Management and Care Coordination Needs   Start Date: 06/01/2023  Expected End Date: 08/31/2023  Priority: High  Note:   Current Barriers:  Knowledge Deficits related to  plan of care for management of dementia, mild intellectual disability, depression, obesity, gout, HLD, prediabetes, HTN, asthma, rhinitis, osteoarthritis Care Coordination needs related to dementia, mild intellectual disability, depression, obesity, gout, HLD, prediabetes, HTN, asthma, rhinitis, osteoarthritis Chronic Disease Management support and education needs related to dementia, mild intellectual disability, depression, obesity, gout, HLD, prediabetes, HTN, asthma, rhinitis, osteoarthritis Literacy barriers Cognitive Deficits  RNCM Clinical Goal(s):  Patient will verbalize understanding of plan for management of  dementia, mild intellectual disability, depression, obesity, gout, HLD, prediabetes, HTN, asthma, rhinitis, osteoarthritis  as evidenced by DPR, sister's report verbalize basic understanding of  dementia, mild intellectual disability, depression, obesity, gout, HLD, prediabetes, HTN, asthma, rhinitis, osteoarthritis disease process and self health management plan as evidenced by DPR, sister's report take all medications exactly as prescribed and will call provider for medication related questions as evidenced by DPR, sister's report demonstrate understanding of rationale for each prescribed medication as evidenced by DPR, sister's report attend all scheduled medical appointments as evidenced by DPR, sister's report and EMR review demonstrate ongoing adherence to prescribed treatment plan for dementia, mild intellectual disability, depression, obesity, gout, HLD, prediabetes, HTN, asthma, rhinitis, osteoarthritis   as evidenced by DPR, sister's report and EMR review continue to work with RN Care Manager to address care  management and care coordination needs related to  dementia, mild intellectual disability, depression, obesity, gout, HLD, prediabetes, HTN, asthma, rhinitis, osteoarthritis as evidenced by adherence to CM Team Scheduled appointments work with Child psychotherapist  related to the  management of  dementia, mild intellectual disability, depression  as evidenced by review of EMR and patient or Child psychotherapist report through collaboration with Medical illustrator, provider, and care team.   Interventions: Inter-disciplinary care team collaboration (see longitudinal plan of care) Evaluation of current treatment plan related to  self management and patient's adherence to plan as established by provider  Asthma: (Status:New goal.) Long Term Goal Advised DPR. Patient;s sister to track and manage Asthma triggers Provided instruction about proper use of medications used for management of Asthma including inhalers Discussed the importance of adequate rest and management of fatigue with Asthma Assessed social determinant of health barriers   Hyperlipidemia Interventions:  (Status:  New goal.) Long Term Goal Medication review performed; medication list updated in electronic medical record.  Provider established cholesterol goals reviewed Counseled on importance of regular laboratory monitoring as prescribed Reviewed importance of limiting foods high in cholesterol Assessed social determinant of health barriers   Hypertension Interventions:  (Status:  New goal.) Long Term Goal Last practice recorded BP readings:  BP Readings from Last 3 Encounters:  04/29/23 (!) 141/87  03/23/23 128/80  06/28/22 128/76   Most recent eGFR/CrCl: No results found for: "EGFR"  No components found for: "CRCL"  Evaluation of current treatment plan related to hypertension self management and patient's/ DPR. Patient's sister  adherence to plan as established by provider Reviewed medications with patient/  DPR. Patient's sister and discussed importance of compliance Discussed plans with patient/ DPR. Patient's sister ,  for ongoing care management follow up and provided patient / DPR. Patient's sister with direct contact information for care management team Advised patient/ DPR. Patient's sister ,   providing education and rationale, to monitor blood pressure daily and record, calling PCP for findings outside established parameters Reviewed scheduled/upcoming provider appointments  Assessed social determinant of health barriers  Dementia:  (Status:  New goal.)  Long Term Goal Evaluation of current treatment plan related to misuse of: Dementia with agitation, Dementia with mood disturbances, Dementia with other behavioral disturbances, and Dementia with psychotic disturbances Collaborated with DPR, patient's sister, Reviewed medications,  Emotional Support Provided to patient/caregiver, Sleep assessment completed, Encouraged patient/caregiver counseling/support, Referred to LCSW for assistance with Guardianship discussion, Discussed importance of discussing diagnosis with provider, Discussed importance of attendance to all provider appointments, and Advised to contact provider for new or worsening symptoms  Patient Goals/Self-Care Activities: Take all medications as prescribed Attend all scheduled provider appointments Call pharmacy for medication refills 3-7 days in advance of running out of medications Perform all self care activities independently  Perform IADL's (shopping, preparing meals, housekeeping, managing finances) independently Call provider office for new concerns or questions  Work with the social worker to address care coordination needs and will continue to work with the clinical team to address health care and disease management related needs  Follow Up Plan:  The patient/ DPR. Patient's sister,   has been provided with contact information for the care management team and has been advised to call with any health related questions or concerns.  The care management team will reach out to the patient/ DPR. Patient's sister,  again over the next 30 business  days.    Kathi Der RN, BSN [place]  [employer] [employer] -  Managed Medicaid High  Risk [phone]

## 2023-06-01 NOTE — Patient Outreach (Addendum)
Medicaid Managed Care   Nurse Care Manager Note  06/01/2023 Name:  Alyssa Powell MRN:  [id number] DOB:  06/05/1951  Alyssa Powell is an 72 y.o. year old female who is a primary patient of [name], MD.  The Medicaid Managed Care Coordination team was consulted for assistance with: chronic healthcare management needs, dementia, mild intellectual disability, depression, obesity,  gout, HLD, prediabetes, HTN, asthma, rhinitis, osteoarthritis-knee  [name] was given information about Medicaid Managed Care Coordination team services today. [name] Patient agreed to services and verbal consent obtained.  Engaged with patient by telephone for initial visit in response to provider referral for case management and/or care coordination services.   Assessments/Interventions:  Review of past medical history, allergies, medications, health status, including review of consultants reports, laboratory and other test data, was performed as part of comprehensive evaluation and provision of chronic care management services.  SDOH (Social Determinants of Health) assessments and interventions performed: SDOH Interventions    Flowsheet Row Patient Outreach Telephone from 06/01/2023 in [place] POPULATION HEALTH DEPARTMENT Patient Outreach Telephone from 05/25/2023 in [place] POPULATION HEALTH DEPARTMENT Patient Outreach Telephone from 05/03/2023 in [place] POPULATION HEALTH DEPARTMENT Patient Outreach Telephone from 04/12/2023 in [place] POPULATION HEALTH DEPARTMENT Clinical Support from 04/05/2023 in [facility] Clinical Support from 12/04/2021 in [facility] at [facility]  SDOH Interventions        Food Insecurity Interventions Intervention Not Indicated -- -- -- Intervention Not Indicated Intervention Not Indicated  Housing Interventions -- -- -- -- Intervention Not Indicated Other (Comment)  [will forward info to pt's provider.]   Transportation Interventions -- -- -- -- Intervention Not Indicated [employer]  Utilities Interventions -- -- -- -- Intervention Not Indicated --  Alcohol Usage Interventions Intervention Not Indicated (Score <7) -- -- -- -- --  Financial Strain Interventions -- -- -- -- Intervention Not Indicated Intervention Not Indicated  Physical Activity Interventions -- -- -- -- -- Intervention Not Indicated  Stress Interventions -- [employer], Provide Counseling  [Due to keeping up with pt's multiple medical appointments and sister's Sunny recent surgery who is a secondary caregiver] [employer], Provide Counseling  [Sister Sunny had recent surgery so Sister Gunnar Fusi is doing it all for entire family and is experiencing caregiver burnout] [employer], Provide Counseling Patient Unable to Answer Patient Refused  [pt unable to answer]  Social Connections Interventions -- -- -- -- Intervention Not Indicated Intervention Not Indicated  Health Literacy Interventions -- -- -- -- Intervention Not Indicated --     Care Plan Allergies  Allergen Reactions   Amoxicillin Hives   Codeine Phosphate Hives   Penicillins     Childhood allergy   Medications Reviewed Today     Reviewed by [name], RN (Registered Nurse) on 06/01/23 at 1110  Med List Status: <None>   Medication Order Taking? Sig Documenting Provider Last Dose Status Informant  budesonide (PULMICORT) 0.5 MG/2ML nebulizer solution [id number] No Take 2 mLs (0.5 mg total) by nebulization 2 (two) times daily. [name], MD Taking Active   cetirizine (ZYRTEC) 10 MG tablet [id number] No TAKE 1 TABLET BY MOUTH EVERYDAY AT BEDTIME [name], MD Taking Active   colchicine 0.6 MG tablet [id number] No TAKE 1 TABLET BY MOUTH TWICE A DAY [name], MD Taking Active   fluticasone (FLONASE) 50 MCG/ACT nasal spray [id number] No Place 2 sprays into both  nostrils daily. [name], MD Taking Active   levalbuterol [name] 1.25 MG/3ML nebulizer solution [id number] No Take 1.25 mg by nebulization every 4 (four) hours as needed for wheezing. [name], MD Taking Active   meloxicam [name]) 7.5 MG tablet [id number] No TAKE 1 TABLET (7.5 MG TOTAL) BY MOUTH DAILY AS NEEDED. FOR PAIN [name], MD Taking Active   memantine [name]) 5 MG tablet [id number]  Take 1 tablet (5 mg at night) for 2 weeks, then increase to 1 tablet (5 mg) twice a day Elwyn Reach  Active   pantoprazole (PROTONIX) 40 MG tablet [id number] No TAKE 1 TABLET BY MOUTH EVERY DAY [name], MD Taking Active   predniSONE (DELTASONE) 20 MG tablet [id number] No Take 2 tablets (40 mg total) by mouth daily with breakfast.  Patient not taking: Reported on 04/29/2023   [name], NP-C Not Taking Active   rosuvastatin (CRESTOR) 10 MG tablet [id number] No Take 1 tablet (10 mg total) by mouth daily. 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) 03/23/2023   Need for vaccination 07/04/2022   Encounter for general adult medical examination with abnormal findings 07/04/2022   Class 1 obesity due to excess calories with serious comorbidity and body mass index (BMI) of 34.0 to 34.9 in adult 06/28/2022   Flu vaccine need 06/28/2022   Screen for colon cancer 06/28/2022   Visit for screening mammogram 06/28/2022   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/28/2022    Hyperlipidemia with target LDL less than 130 06/28/2022   Moderate persistent asthma 09/05/2018   Essential hypertension 09/05/2018   Major depressive disorder with single episode 09/05/2018   Osteoarthritis, knee 12/16/2011   Allergic rhinitis 08/07/2009   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   Conditions to be addressed/monitored per PCP order:   chronic healthcare management needs, dementia, mild intellectual disability, depression, obesity,  gout, HLD, prediabetes, HTN, asthma, rhinitis, osteoarthritis-knee  Care Plan : RN Care Manager Plan of Care  Updates made by [name], RN since 06/01/2023 12:00 AM     Problem: Health Promotion or Disease Self-Management (General Plan of Care)      Long-Range Goal: Chronic Disease Management and Care Coordination Needs   Start Date: 06/01/2023  Expected End Date: 08/31/2023  Priority: High  Note:   Current Barriers:  Knowledge Deficits related to plan of care for management of dementia, mild intellectual disability, depression, obesity, gout, HLD, prediabetes, HTN, asthma, rhinitis, osteoarthritis Care Coordination needs related to dementia, mild intellectual disability, depression, obesity, gout, HLD, prediabetes, HTN, asthma, rhinitis, osteoarthritis Chronic Disease Management support and education needs related to dementia, mild intellectual ability, depression, obesity, gout, HLD, prediabetes, HTN, asthma, rhinitis, osteoarthritis Literacy barriers Cognitive Deficits  RNCM Clinical Goal(s):  Patient will verbalize understanding of plan for management of  dementia, mild intellectual ability, depression, obesity, gout, HLD, prediabetes, HTN, asthma, rhinitis, osteoarthritis  as evidenced by DPR, sister's report verbalize  basic understanding of  dementia, mild intellectual ability, depression, obesity, gout, HLD, prediabetes, HTN, asthma, rhinitis, osteoarthritis disease process and self health management plan as evidenced by DPR,  sister's report take all medications exactly as prescribed and will call provider for medication related questions as evidenced by DPR, sister's report demonstrate understanding of rationale for each prescribed medication as evidenced by DPR, sister's report attend all scheduled medical appointments as evidenced by DPR, sister's report and EMR review demonstrate ongoing adherence to prescribed treatment plan for dementia, mild intellectual ability, depression, obesity, gout, HLD, prediabetes, HTN, asthma, rhinitis, osteoarthritis   as evidenced by DPR, sister's report and EMR review continue to work with RN Care Manager to address care management and care coordination needs related to  dementia, mild intellectual ability, depression, obesity, gout, HLD, prediabetes, HTN, asthma, rhinitis, osteoarthritisas evidenced by adherence to CM Team Scheduled appointments work with Child psychotherapist  related to the management of  dementia, mild intellectual ability, depression  as evidenced by review of EMR and patient or Child psychotherapist report through collaboration with Medical illustrator, provider, and care team.   Interventions: Inter-disciplinary care team collaboration (see longitudinal plan of care) Evaluation of current treatment plan related to  self management and patient's adherence to plan as established by provider  Asthma: (Status:New goal.) Long Term Goal Advised DPR. Patient;s sister to track and manage Asthma triggers Provided instruction about proper use of medications used for management of Asthma including inhalers Discussed the importance of adequate rest and management of fatigue with Asthma Assessed social determinant of health barriers   Hyperlipidemia Interventions:  (Status:  New goal.) Long Term Goal Medication review performed; medication list updated in electronic medical record.  Provider established cholesterol goals reviewed Counseled on importance of regular laboratory monitoring as  prescribed Reviewed importance of limiting foods high in cholesterol Assessed social determinant of health barriers   Hypertension Interventions:  (Status:  New goal.) Long Term Goal Last practice recorded BP readings:  BP Readings from Last 3 Encounters:  04/29/23 (!) 141/87  03/23/23 128/80  06/28/22 128/76   Most recent eGFR/CrCl: No results found for: "EGFR"  No components found for: "CRCL"  Evaluation of current treatment plan related to hypertension self management and patient's/ DPR. Patient's sister  adherence to plan as established by provider Reviewed medications with patient/  DPR. Patient's sister and discussed importance of compliance Discussed plans with patient/ DPR. Patient's sister, for ongoing care management follow up and provided patient / DPR. Patient's sister with direct contact information for care management team Advised patient/ DPR. Patient's sister ,  providing education and rationale, to monitor blood pressure daily and record, calling PCP for findings outside established parameters Reviewed scheduled/upcoming provider appointments  Assessed social determinant of health barriers  Dementia:  (Status:  New goal.)  Long Term Goal Evaluation of current treatment plan related to misuse of: Dementia with agitation, Dementia with mood disturbances, Dementia with other behavioral disturbances, and Dementia with psychotic disturbances Collaborated with DPR, patient's sister, Reviewed medications,  Emotional Support Provided to patient/caregiver, Sleep assessment completed, Encouraged patient/caregiver counseling/support, Referred to LCSW for assistance with Guardianship discussion, Discussed importance of discussing diagnosis with provider, Discussed importance of attendance to all provider appointments, and Advised to contact provider for new or worsening symptoms  Patient Goals/Self-Care Activities: Take all medications as prescribed Attend all scheduled provider  appointments Call pharmacy for medication refills 3-7 days in advance of running out of medications Perform all self care activities independently  Perform IADL's (shopping,  preparing meals, housekeeping, managing finances) independently Call provider office for new concerns or questions  Work with the social worker to address care coordination needs and will continue to work with the clinical team to address health care and disease management related needs  Follow Up Plan:  The patient/ DPR. Patient's sister,   has been provided with contact information for the care management team and has been advised to call with any health related questions or concerns.  The care management team will reach out to the patient/ DPR. Patient's sister,  again over the next 30 business  days.    Long-Range Goal: Establish Plan of Care for Chronic Disease Management Needs   Start Date: 06/01/2023  Expected End Date: 08/31/2023  Priority: High  Note:   Timeframe:  Long-Range Goal Priority:  High Start Date: 06/01/23                            Expected End Date:   ongoing                    Follow Up Date 06/28/23    - schedule appointment for vaccines needed due to my age or health - schedule recommended health tests (blood work, mammogram, colonoscopy, pap test) - schedule and keep appointment for annual check-up    Why is this important?   Screening tests can find diseases early when they are easier to treat.  Your doctor or nurse will talk with you about which tests are important for you.  Getting shots for common diseases like the flu and shingles will help prevent them.     Follow Up:  Patient / DPR agrees to Care Plan and Follow-up.  Plan: The Managed Medicaid care management team will reach out to the patient / DPR again over the next 30 business  days. and The  [name] (DPR) has been provided with contact information for the Managed Medicaid care management team and has been  advised to call with any health related questions or concerns.  Date/time of next scheduled RN care management/care coordination outreach: 06/28/23 at 230.

## 2023-06-02 ENCOUNTER — Ambulatory Visit
Admission: RE | Admit: 2023-06-02 | Discharge: 2023-06-02 | Disposition: A | Discharge status: Home / Self Care | Payer: Medicare (Managed Care) | Source: Ambulatory Visit | Attending: Physician Assistant | Admitting: Physician Assistant

## 2023-06-14 ENCOUNTER — Ambulatory Visit: Payer: Medicare (Managed Care) | Admitting: Internal Medicine

## 2023-06-15 ENCOUNTER — Encounter: Payer: Self-pay | Admitting: Family Medicine

## 2023-06-15 ENCOUNTER — Ambulatory Visit: Payer: Medicare (Managed Care) | Admitting: Family Medicine

## 2023-06-15 VITALS — BP 110/70 | HR 73 | Temp 97.9°F | Wt 201.0 lb

## 2023-06-15 DIAGNOSIS — J4541 Moderate persistent asthma with (acute) exacerbation: Secondary | ICD-10-CM | POA: Diagnosis not present

## 2023-06-15 DIAGNOSIS — F325 Major depressive disorder, single episode, in full remission: Secondary | ICD-10-CM | POA: Diagnosis not present

## 2023-06-15 MED ORDER — SERTRALINE HCL 50 MG PO TABS
ORAL_TABLET | ORAL | 0 refills | Status: DC
Start: 2023-06-15 — End: 2023-07-12

## 2023-06-15 MED ORDER — ALBUTEROL SULFATE 4 MG PO TABS: 4.0000 mg | ORAL_TABLET | Freq: Three times a day (TID) | ORAL | 0 refills | Status: DC

## 2023-06-15 MED ORDER — MONTELUKAST SODIUM 10 MG PO TABS: 10.0000 mg | ORAL_TABLET | Freq: Every day | ORAL | 3 refills | Status: DC

## 2023-06-15 MED ORDER — METHYLPREDNISOLONE ACETATE 40 MG/ML IJ SUSP
40.0000 mg | Freq: Once | INTRAMUSCULAR | Status: AC
Start: 2023-06-15 — End: 2023-06-15
  Administered 2023-06-15: 40 mg via INTRAMUSCULAR

## 2023-06-15 MED ORDER — METHYLPREDNISOLONE ACETATE 80 MG/ML IJ SUSP
80.0000 mg | Freq: Once | INTRAMUSCULAR | Status: AC
Start: 2023-06-15 — End: 2023-06-15
  Administered 2023-06-15: 80 mg via INTRAMUSCULAR

## 2023-06-15 NOTE — Addendum Note (Signed)
Addended by: Carola Rhine on: 06/15/2023 04:38 PM   Modules accepted: Orders

## 2023-06-15 NOTE — Progress Notes (Signed)
Subjective:    Patient ID: Alyssa Powell, female    DOB: 1950-10-24, 72 y.o.   MRN: [id number]  HPI Here with her sister for some worsening of her asthma. She has been prescribed Albuterol HFA as needed and Budesonide per nebulizer as needed. According to her sister, [name] has toruble using these properly and likely does not get much benefit from them. They ask if she can use oral medications as much as possible. She has been mildly SOB and wheezing the past few weeks. No fever.    Review of Systems  Constitutional: Negative.   Respiratory:  Positive for shortness of breath. Negative for cough and wheezing.   Cardiovascular: Negative.        Objective:   Physical Exam Constitutional:      Appearance: Normal appearance. She is not ill-appearing.  Cardiovascular:     Rate and Rhythm: Normal rate and regular rhythm.     Pulses: Normal pulses.     Heart sounds: Normal heart sounds.  Pulmonary:     Effort: Pulmonary effort is normal.     Breath sounds: Normal breath sounds.  Neurological:     Mental Status: She is alert.           Assessment & Plan:  Asthma, recent flare up. She is given a shot of DepoMedrol today. We will also start her on Singulair daily, and she can try oral Albuterol TID. She is scheduled to follow up with Dr. Yetta Barre on 07-12-23.  Gershon Crane, MD

## 2023-06-16 ENCOUNTER — Ambulatory Visit: Payer: Medicare (Managed Care) | Admitting: Internal Medicine

## 2023-06-17 ENCOUNTER — Other Ambulatory Visit: Payer: Self-pay | Admitting: Family Medicine

## 2023-06-28 ENCOUNTER — Other Ambulatory Visit: Payer: Medicare (Managed Care) | Admitting: Obstetrics and Gynecology

## 2023-06-28 NOTE — Patient Outreach (Signed)
Care Coordination  06/28/2023  Kaari Zeigler [birth year] [id number]  RNCM called patient's sister, Paula-DPR at scheduled time.  Patient's sister answered phone and asked to be called at a different time-around 1100.  RNCM rescheduled appointment at her request.  Kathi Der RN, BSN [place]  [facility] Care Management Coordinator - Managed [place] High Risk [phone]

## 2023-07-01 ENCOUNTER — Other Ambulatory Visit: Payer: Medicare (Managed Care) | Admitting: Obstetrics and Gynecology

## 2023-07-01 NOTE — Patient Instructions (Signed)
Visit Information  [name] / DPR was given information about Medicaid Managed Care team care coordination services and verbally consented to engagement with the [facility] Managed Care team.   Alyssa Powell / DPR - following are the goals we discussed in your visit today:   Goals Addressed    Timeframe:  Long-Range Goal Priority:  High Start Date: 06/01/23                            Expected End Date:   ongoing                    Follow Up Date  08/02/23   - schedule appointment for vaccines needed due to my age or health - schedule recommended health tests (blood work, mammogram, colonoscopy, pap test) - schedule and keep appointment for annual check-up    Why is this important?   Screening tests can find diseases early when they are easier to treat.  Your doctor or nurse will talk with you about which tests are important for you.  Getting shots for common diseases like the flu and shingles will help prevent them.   07/01/23:  Upcoming appts with PCP, NEURO, and [facility]  Patient / DPR verbalizes understanding of instructions and care plan provided today and agrees to view in MyChart. Active MyChart status and patient / DPR understanding of how to access instructions and care plan via MyChart confirmed with patient/ DPR.    The Managed Medicaid care management team will reach out to the patient / DPR again over the next 30 business  days.  The  Alyssa Powell (DPR) has been provided with contact information for the Managed Medicaid care management team and has been advised to call with any health related questions or concerns.   Following is a copy of your plan of care:  Care Plan : RN Care Manager Plan of Care  Updates made by Alyssa Chandler, RN since 07/01/2023 12:00 AM     Problem: Health Promotion or Disease Self-Management (General Plan of Care)      Long-Range Goal: Chronic Disease Management and Care Coordination Needs   Start Date: 06/01/2023  Expected End Date:  08/31/2023  Priority: High  Note:   Current Barriers:  Knowledge Deficits related to plan of care for management of dementia, mild intellectual disability, depression, obesity, gout, HLD, prediabetes, HTN, asthma, rhinitis, osteoarthritis Care Coordination needs related to dementia, mild intellectual disability, depression, obesity, gout, HLD, prediabetes, HTN, asthma, rhinitis, osteoarthritis Chronic Disease Management support and education needs related to dementia, mild intellectual disability, depression, obesity, gout, HLD, prediabetes, HTN, asthma, rhinitis, osteoarthritis Literacy barriers Cognitive Deficits 07/12/23:  Asthma medications adjusted.  BP stable.  Has f/u appts scheduled. Patient's sister states Alyssa Powell is doing well right now.  Will have prescriptions transferred to [facility].  Patient's sister would like to get order for PT/OT for Alyssa Powell and will inquire with PCP ay scheduled appt 10/22.  RNCM Clinical Goal(s):  Patient will verbalize understanding of plan for management of  dementia, mild intellectual disability, depression, obesity, gout, HLD, prediabetes, HTN, asthma, rhinitis, osteoarthritis  as evidenced by DPR, sister's report verbalize basic understanding of  dementia, mild intellectual disability, depression, obesity, gout, HLD, prediabetes, HTN, asthma, rhinitis, osteoarthritis disease process and self health management plan as evidenced by DPR, sister's report take all medications exactly as prescribed and will call provider for medication related questions as evidenced by DPR,  sister's report demonstrate understanding of rationale for each prescribed medication as evidenced by DPR, sister's report attend all scheduled medical appointments as evidenced by DPR, sister's report and EMR review demonstrate ongoing adherence to prescribed treatment plan for dementia, mild intellectual disability, depression, obesity, gout, HLD, prediabetes, HTN, asthma, rhinitis,  osteoarthritis   as evidenced by DPR, sister's report and EMR review continue to work with RN Care Manager to address care management and care coordination needs related to  dementia, mild intellectual disability, depression, obesity, gout, HLD, prediabetes, HTN, asthma, rhinitis, osteoarthritis as evidenced by adherence to CM Team Scheduled appointments work with Child psychotherapist  related to the management of  dementia, mild intellectual disability, depression  as evidenced by review of EMR and patient or Child psychotherapist report through collaboration with Medical illustrator, provider, and care team.   Interventions: Inter-disciplinary care team collaboration (see longitudinal plan of care) Evaluation of current treatment plan related to  self management and patient's adherence to plan as established by provider  Asthma: (Status:New goal.) Long Term Goal Advised DPR. Patient;s sister to track and manage Asthma triggers Provided instruction about proper use of medications used for management of Asthma including inhalers Discussed the importance of adequate rest and management of fatigue with Asthma Assessed social determinant of health barriers   Hyperlipidemia Interventions:  (Status:  New goal.) Long Term Goal Medication review performed; medication list updated in electronic medical record.  Provider established cholesterol goals reviewed Counseled on importance of regular laboratory monitoring as prescribed Reviewed importance of limiting foods high in cholesterol Assessed social determinant of health barriers   Hypertension Interventions:  (Status:  New goal.) Long Term Goal Last practice recorded BP readings:  BP Readings from Last 3 Encounters:  04/29/23 (!) 141/87  03/23/23 128/80  06/15/23 110/70   Most recent eGFR/CrCl: No results found for: "EGFR"  No components found for: "CRCL"  Evaluation of current treatment plan related to hypertension self management and patient's/ DPR. Patient's  sister  adherence to plan as established by provider Reviewed medications with patient/  DPR. Patient's sister and discussed importance of compliance Discussed plans with patient/ DPR. Patient's sister ,  for ongoing care management follow up and provided patient / DPR. Patient's sister with direct contact information for care management team Advised patient/ DPR. Patient's sister ,  providing education and rationale, to monitor blood pressure daily and record, calling PCP for findings outside established parameters Reviewed scheduled/upcoming provider appointments  Assessed social determinant of health barriers  Dementia:  (Status:  New goal.)  Long Term Goal Evaluation of current treatment plan related to misuse of: Dementia with agitation, Dementia with mood disturbances, Dementia with other behavioral disturbances, and Dementia with psychotic disturbances Collaborated with DPR, patient's sister, Reviewed medications,  Emotional Support Provided to patient/caregiver, Sleep assessment completed, Encouraged patient/caregiver counseling/support, Referred to LCSW for assistance with Guardianship discussion, Discussed importance of discussing diagnosis with provider, Discussed importance of attendance to all provider appointments, and Advised to contact provider for new or worsening symptoms  Patient Goals/Self-Care Activities: Take all medications as prescribed Attend all scheduled provider appointments Call pharmacy for medication refills 3-7 days in advance of running out of medications Perform all self care activities independently  Perform IADL's (shopping, preparing meals, housekeeping, managing finances) independently Call provider office for new concerns or questions  Work with the social worker to address care coordination needs and will continue to work with the clinical team to address health care and disease management related needs  Follow Up  Plan:  The patient/ DPR. Patient's sister,    has been provided with contact information for the care management team and has been advised to call with any health related questions or concerns.  The care management team will reach out to the patient/ DPR. Patient's sister,  again over the next 30 business  days.    Kathi Der RN, BSN [place]  [facility] - Managed Medicaid High Risk [phone]

## 2023-07-01 NOTE — Patient Outreach (Signed)
Medicaid Managed Care   Nurse Care Manager Note  07/01/2023 Name:  Alyssa Powell MRN:  [id number] DOB:  [birth year]  Alyssa Powell is an 72 y.o. year old female who is a primary patient of Alyssa Grandchild, MD.  The Medicaid Managed Care Coordination team was consulted for assistance with:    Chronic healthcare management needs, dementia, mild intellectual disability, depression, obesity, gout, HLD, preDM, HTN, asthma, rhinitis, osteoarthritis.  [name] / DPR was given information about Medicaid Managed Care Coordination team services today. Alyssa Powell (DPR) agreed to services and verbal consent obtained.  Engaged with patient/ DPR  by telephone for follow up visit in response to provider referral for case management and/or care coordination services.   Assessments/Interventions:  Review of past medical history, allergies, medications, health status, including review of consultants reports, laboratory and other test data, was performed as part of comprehensive evaluation and provision of chronic care management services.  SDOH (Social Determinants of Health) assessments and interventions performed: SDOH Interventions    Flowsheet Row Patient Outreach Telephone from 07/01/2023 in [place] POPULATION HEALTH DEPARTMENT Patient Outreach Telephone from 06/28/2023 in [place] POPULATION HEALTH DEPARTMENT Patient Outreach Telephone from 06/01/2023 in [place] POPULATION HEALTH DEPARTMENT Patient Outreach Telephone from 05/25/2023 in [place] POPULATION HEALTH DEPARTMENT Patient Outreach Telephone from 05/03/2023 in [place] POPULATION HEALTH DEPARTMENT Patient Outreach Telephone from 04/12/2023 in [place] POPULATION HEALTH DEPARTMENT  SDOH Interventions        Food Insecurity Interventions -- -- Intervention Not Indicated -- -- --  Housing Interventions -- Intervention Not Indicated -- -- -- --  Transportation Interventions Intervention Not Indicated -- -- -- --  --  Utilities Interventions Intervention Not Indicated -- -- -- -- --  Alcohol Usage Interventions -- -- Intervention Not Indicated (Score <7) -- -- --  Stress Interventions -- -- -- Alyssa Powell, Provide Counseling  [Due to keeping up with pt's multiple medical appointments and Alyssa's Alyssa Powell recent surgery who is a secondary caregiver] Alyssa Powell, Provide Counseling  Alyssa Powell had recent surgery so Alyssa Powell is doing it all for entire family and is experiencing caregiver burnout] Alyssa Powell, Provide Counseling     Care Plan Allergies  Allergen Reactions   Amoxicillin Hives   Codeine Phosphate Hives   Penicillins     Childhood allergy    Medications Reviewed Today     Reviewed by Alyssa Chandler, RN (Registered Nurse) on 07/01/23 at 1059  Med List Status: <None>   Medication Order Taking? Sig Documenting Provider Last Dose Status Informant  albuterol (PROVENTIL) 4 MG tablet [id number] Yes Take 1 tablet (4 mg total) by mouth 3 (three) times daily. Alyssa Salisbury, MD Taking Active   budesonide (PULMICORT) 0.5 MG/2ML nebulizer solution [id number] No Take 2 mLs (0.5 mg total) by nebulization 2 (two) times daily.  Patient not taking: Reported on 07/01/2023   Alyssa Grandchild, MD Not Taking Active   cetirizine (ZYRTEC) 10 MG tablet [id number]  TAKE 1 TABLET BY MOUTH EVERYDAY AT BEDTIME Alyssa Skeans, MD  Active   colchicine 0.6 MG tablet [id number]  TAKE 1 TABLET BY MOUTH TWICE A DAY Alyssa Grandchild, MD  Active   fluticasone (FLONASE) 50 MCG/ACT nasal spray [id number]  Place 2 sprays into both nostrils daily. Alyssa Skeans, MD  Active   levalbuterol [name] 1.25 MG/3ML nebulizer solution [id number] No Take 1.25 mg by nebulization every 4 (four) hours as needed for wheezing.  Patient not taking: Reported on 07/01/2023   Alyssa Grandchild, MD Not Taking Active   meloxicam (MOBIC) 7.5 MG tablet [id number]  TAKE 1 TABLET  (7.5 MG TOTAL) BY MOUTH DAILY AS NEEDED. FOR PAIN Alyssa Skeans, MD  Active   memantine [facility]) 5 MG tablet [id number]  Take 1 tablet (5 mg at night) for 2 weeks, then increase to 1 tablet (5 mg) twice a day Elwyn Reach  Active   montelukast (SINGULAIR) 10 MG tablet [id number]  TAKE 1 TABLET BY MOUTH EVERYDAY AT BEDTIME Alyssa Salisbury, MD  Active   pantoprazole (PROTONIX) 40 MG tablet [id number]  TAKE 1 TABLET BY MOUTH EVERY DAY Alyssa Skeans, MD  Active   rosuvastatin (CRESTOR) 10 MG tablet [id number]  Take 1 tablet (10 mg total) by mouth daily.  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) 03/23/2023   Need for vaccination 07/04/2022   Encounter for general adult medical examination with abnormal findings 07/04/2022   Class 1 obesity due to excess calories with serious comorbidity and body mass index (BMI) of 34.0 to 34.9 in adult 06/28/2022   Flu vaccine need 06/28/2022   Screen for colon cancer 06/28/2022   Visit for screening mammogram 06/28/2022   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/28/2022   Hyperlipidemia with target LDL less than 130 06/28/2022   Moderate persistent asthma 09/05/2018   Essential hypertension 09/05/2018   Major depressive disorder with single episode 09/05/2018   Osteoarthritis, knee 12/16/2011   Allergic rhinitis 08/07/2009   Mild intellectual disability  11/17/2006   Conditions to be addressed/monitored per PCP order:  Chronic healthcare management needs, dementia, mild intellectual disability, depression, obesity, gout, HLD, preDM, HTN, asthma, rhinitis, osteoarthritis.  Care Plan : RN Care Manager Plan of Care  Updates made by Alyssa Chandler, RN since 07/01/2023 12:00 AM     Problem: Health Promotion or Disease Self-Management (General Plan of Care)      Long-Range Goal: Chronic Disease Management and Care Coordination Needs   Start Date: 06/01/2023  Expected End Date: 08/31/2023  Priority: High  Note:   Current Barriers:  Knowledge Deficits related to plan of care for management of dementia, mild intellectual disability, depression, obesity, gout, HLD, prediabetes, HTN, asthma, rhinitis, osteoarthritis Care Coordination needs related to dementia, mild intellectual disability, depression, obesity, gout, HLD, prediabetes, HTN, asthma, rhinitis, osteoarthritis Chronic Disease Management support and education needs related to dementia, mild intellectual disability, depression, obesity, gout, HLD, prediabetes, HTN, asthma, rhinitis, osteoarthritis Literacy barriers Cognitive Deficits 07/12/23:  Asthma medications adjusted.  BP stable.  Has f/u appts scheduled. Patient's Alyssa states Nuriya is doing well right now.  Will have prescriptions transferred to [facility].  Patient's Alyssa would like to get order for PT/OT for Suki and will inquire with PCP ay scheduled appt  10/22.  RNCM Clinical Goal(s):  Patient will verbalize understanding of plan for management of  dementia, mild intellectual disability, depression, obesity, gout, HLD, prediabetes, HTN, asthma, rhinitis, osteoarthritis  as evidenced by DPR, Alyssa's report verbalize basic understanding of  dementia, mild intellectual disability, depression, obesity, gout, HLD, prediabetes, HTN, asthma, rhinitis, osteoarthritis disease process and self health management plan as  evidenced by DPR, Alyssa's report take all medications exactly as prescribed and will call provider for medication related questions as evidenced by DPR, Alyssa's report demonstrate understanding of rationale for each prescribed medication as evidenced by DPR, Alyssa's report attend all scheduled medical appointments as evidenced by DPR, Alyssa's report and EMR review demonstrate ongoing adherence to prescribed treatment plan for dementia, mild intellectual disability, depression, obesity, gout, HLD, prediabetes, HTN, asthma, rhinitis, osteoarthritis   as evidenced by DPR, Alyssa's report and EMR review continue to work with RN Care Manager to address care management and care coordination needs related to  dementia, mild intellectual disability, depression, obesity, gout, HLD, prediabetes, HTN, asthma, rhinitis, osteoarthritis as evidenced by adherence to CM Team Scheduled appointments work with Child psychotherapist  related to the management of  dementia, mild intellectual disability, depression  as evidenced by review of EMR and patient or Child psychotherapist report through collaboration with Medical illustrator, provider, and care team.   Interventions: Inter-disciplinary care team collaboration (see longitudinal plan of care) Evaluation of current treatment plan related to  self management and patient's adherence to plan as established by provider  Asthma: (Status:New goal.) Long Term Goal Advised DPR. Patient;s Alyssa to track and manage Asthma triggers Provided instruction about proper use of medications used for management of Asthma including inhalers Discussed the importance of adequate rest and management of fatigue with Asthma Assessed social determinant of health barriers   Hyperlipidemia Interventions:  (Status:  New goal.) Long Term Goal Medication review performed; medication list updated in electronic medical record.  Provider established cholesterol goals reviewed Counseled on importance of  regular laboratory monitoring as prescribed Reviewed importance of limiting foods high in cholesterol Assessed social determinant of health barriers   Hypertension Interventions:  (Status:  New goal.) Long Term Goal Last practice recorded BP readings:  BP Readings from Last 3 Encounters:  04/29/23 (!) 141/87  03/23/23 128/80  06/15/23 110/70   Most recent eGFR/CrCl: No results found for: "EGFR"  No components found for: "CRCL"  Evaluation of current treatment plan related to hypertension self management and patient's/ DPR. Patient's Alyssa  adherence to plan as established by provider Reviewed medications with patient/  DPR. Patient's Alyssa and discussed importance of compliance Discussed plans with patient/ DPR. Patient's Alyssa ,  for ongoing care management follow up and provided patient / DPR. Patient's Alyssa with direct contact information for care management team Advised patient/ DPR. Patient's Alyssa ,  providing education and rationale, to monitor blood pressure daily and record, calling PCP for findings outside established parameters Reviewed scheduled/upcoming provider appointments  Assessed social determinant of health barriers  Dementia:  (Status:  New goal.)  Long Term Goal Evaluation of current treatment plan related to misuse of: Dementia with agitation, Dementia with mood disturbances, Dementia with other behavioral disturbances, and Dementia with psychotic disturbances Collaborated with DPR, patient's Alyssa, Reviewed medications,  Emotional Support Provided to patient/caregiver, Sleep assessment completed, Encouraged patient/caregiver counseling/support, Referred to LCSW for assistance with Guardianship discussion, Discussed importance of discussing diagnosis with provider, Discussed importance of attendance to all provider appointments, and Advised to contact provider for new or worsening  symptoms  Patient Goals/Self-Care Activities: Take all medications as  prescribed Attend all scheduled provider appointments Call pharmacy for medication refills 3-7 days in advance of running out of medications Perform all self care activities independently  Perform IADL's (shopping, preparing meals, housekeeping, managing finances) independently Call provider office for new concerns or questions  Work with the social worker to address care coordination needs and will continue to work with the clinical team to address health care and disease management related needs  Follow Up Plan:  The patient/ DPR. Patient's Alyssa,   has been provided with contact information for the care management team and has been advised to call with any health related questions or concerns.  The care management team will reach out to the patient/ DPR. Patient's Alyssa,  again over the next 30 business  days.    Long-Range Goal: Establish Plan of Care for Chronic Disease Management Needs   Start Date: 06/01/2023  Expected End Date: 08/31/2023  Priority: High  Note:   Timeframe:  Long-Range Goal Priority:  High Start Date: 06/01/23                            Expected End Date:   ongoing                    Follow Up Date  08/02/23   - schedule appointment for vaccines needed due to my age or health - schedule recommended health tests (blood work, mammogram, colonoscopy, pap test) - schedule and keep appointment for annual check-up    Why is this important?   Screening tests can find diseases early when they are easier to treat.  Your doctor or nurse will talk with you about which tests are important for you.  Getting shots for common diseases like the flu and shingles will help prevent them.   07/01/23:  Upcoming appts with PCP, [facility]UR[facility], and [facility]   Follow Up:  Patient / DPR agrees to Care Plan and Follow-up.  Plan: The Managed Medicaid care management team will reach out to the patient / DPR again over the next 30 business  days. and The  Patient/DPR  has been provided with contact  information for the Managed Medicaid care management team and has been advised to call with any health related questions or concerns.  Date/time of next scheduled RN care management/care coordination outreach:  08/02/23 at 1100.

## 2023-07-12 ENCOUNTER — Other Ambulatory Visit (HOSPITAL_COMMUNITY): Payer: Self-pay

## 2023-07-12 ENCOUNTER — Ambulatory Visit: Payer: Medicare (Managed Care) | Admitting: Internal Medicine

## 2023-07-12 ENCOUNTER — Encounter: Payer: Self-pay | Admitting: Internal Medicine

## 2023-07-12 VITALS — BP 122/82 | HR 74 | Temp 97.5°F | Resp 16 | Ht 67.0 in | Wt 202.2 lb

## 2023-07-12 DIAGNOSIS — Z23 Encounter for immunization: Secondary | ICD-10-CM

## 2023-07-12 DIAGNOSIS — F324 Major depressive disorder, single episode, in partial remission: Secondary | ICD-10-CM | POA: Diagnosis not present

## 2023-07-12 DIAGNOSIS — J4540 Moderate persistent asthma, uncomplicated: Secondary | ICD-10-CM | POA: Diagnosis not present

## 2023-07-12 DIAGNOSIS — J301 Allergic rhinitis due to pollen: Secondary | ICD-10-CM

## 2023-07-12 DIAGNOSIS — F02818 Dementia in other diseases classified elsewhere, unspecified severity, with other behavioral disturbance: Secondary | ICD-10-CM

## 2023-07-12 DIAGNOSIS — E785 Hyperlipidemia, unspecified: Secondary | ICD-10-CM | POA: Diagnosis not present

## 2023-07-12 MED ORDER — SHINGRIX 50 MCG/0.5ML IM SUSR
0.5000 mL | Freq: Once | INTRAMUSCULAR | 1 refills | Status: AC
  Filled 2023-07-12 – 2023-07-13 (×2): qty 0.5, 1d supply, fill #0

## 2023-07-12 MED ORDER — ROSUVASTATIN CALCIUM 10 MG PO TABS
10.0000 mg | ORAL_TABLET | Freq: Every day | ORAL | 1 refills | Status: DC
Start: 2023-07-12 — End: 2024-03-12
  Filled 2023-07-12 – 2023-10-25 (×4): qty 90, 90d supply, fill #0
  Filled 2024-01-16: qty 90, 90d supply, fill #1

## 2023-07-12 MED ORDER — MONTELUKAST SODIUM 10 MG PO TABS
10.0000 mg | ORAL_TABLET | Freq: Every day | ORAL | 1 refills | Status: DC
Start: 2023-07-12 — End: 2024-03-12
  Filled 2023-07-12 – 2023-10-25 (×3): qty 90, 90d supply, fill #0

## 2023-07-12 MED ORDER — SERTRALINE HCL 100 MG PO TABS
100.0000 mg | ORAL_TABLET | Freq: Every day | ORAL | 0 refills | Status: DC
Start: 2023-07-12 — End: 2023-09-05
  Filled 2023-07-12 – 2023-07-13 (×2): qty 90, 90d supply, fill #0

## 2023-07-12 NOTE — Progress Notes (Signed)
Subjective:  Patient ID: Alyssa Powell, female    DOB: [birth year]  Age: 72 y.o. MRN: [id number]  CC: Hyperlipidemia, Asthma, and Allergic Rhinitis    HPI Alyssa Powell presents for f/up -----  Discussed the use of AI scribe software for clinical note transcription with the patient, who gave verbal consent to proceed.  History of Present Illness   The patient, under the care of a neurologist, presents for a follow-up visit after undergoing an MRI. The results of the MRI are yet to be communicated to the patient. The patient has been experiencing wheezing and asthma-like symptoms, for which she was seen by another physician. The physician prescribed Singular and an albuterol pill, as the patient was unable to effectively use a nebulizer or inhaler. The patient's allergy symptoms have reportedly improved since starting this regimen.   The patient has also been started on Zoloft, as recommended by the neurologist, due to depression. She would like to increase the dose. The patient's memory has been a concern, with no significant improvement or worsening noted. The patient's family member, who is involved in her care, is awaiting the MRI results to understand if the memory issues are due to dementia or another condition.  The patient has also been prescribed Crestor for the reduction of heart attack and stroke risk.       Outpatient Medications Prior to Visit  Medication Sig Dispense Refill   albuterol (PROVENTIL) 4 MG tablet Take 1 tablet (4 mg total) by mouth 3 (three) times daily. 90 tablet 0   budesonide (PULMICORT) 0.5 MG/2ML nebulizer solution Take 2 mLs (0.5 mg total) by nebulization 2 (two) times daily. 360 mL 1   cetirizine (ZYRTEC) 10 MG tablet TAKE 1 TABLET BY MOUTH EVERYDAY AT BEDTIME 90 tablet 1   pantoprazole (PROTONIX) 40 MG tablet TAKE 1 TABLET BY MOUTH EVERY DAY 90 tablet 1   fluticasone (FLONASE) 50 MCG/ACT nasal spray Place 2 sprays into both nostrils daily. 48 mL 2    levalbuterol (XOPENEX) 1.25 MG/3ML nebulizer solution Take 1.25 mg by nebulization every 4 (four) hours as needed for wheezing. 72 mL 5   montelukast (SINGULAIR) 10 MG tablet TAKE 1 TABLET BY MOUTH EVERYDAY AT BEDTIME 90 tablet 1   sertraline (ZOLOFT) 50 MG tablet TAKE 1 AND 1/2 TABLETS BY MOUTH DAILY 135 tablet 0   colchicine 0.6 MG tablet TAKE 1 TABLET BY MOUTH TWICE A DAY (Patient not taking: Reported on 07/12/2023) 180 tablet 0   memantine (NAMENDA) 5 MG tablet Take 1 tablet (5 mg at night) for 2 weeks, then increase to 1 tablet (5 mg) twice a day (Patient not taking: Reported on 07/12/2023) 60 tablet 11   meloxicam (MOBIC) 7.5 MG tablet TAKE 1 TABLET (7.5 MG TOTAL) BY MOUTH DAILY AS NEEDED. FOR PAIN (Patient not taking: Reported on 07/12/2023) 90 tablet 0   rosuvastatin (CRESTOR) 10 MG tablet Take 1 tablet (10 mg total) by mouth daily. (Patient not taking: Reported on 06/15/2023) 90 tablet 1   VENTOLIN HFA 108 (90 Base) MCG/ACT inhaler INHALE 1-2 PUFFS BY MOUTH EVERY 6 HOURS AS NEEDED FOR WHEEZE OR SHORTNESS OF BREATH (Patient not taking: Reported on 07/01/2023) 18 each 2   No facility-administered medications prior to visit.    ROS Review of Systems  Constitutional:  Negative for appetite change, diaphoresis, fatigue and unexpected weight change.  HENT:  Positive for postnasal drip. Negative for trouble swallowing.   Eyes: Negative.   Respiratory: Negative.  Negative for cough, chest  tightness, shortness of breath and wheezing.   Cardiovascular:  Negative for chest pain, palpitations and leg swelling.  Gastrointestinal:  Negative for abdominal pain, constipation, diarrhea, nausea and vomiting.  Genitourinary: Negative.  Negative for difficulty urinating.  Musculoskeletal: Negative.   Skin: Negative.   Neurological: Negative.  Negative for dizziness and weakness.  Hematological:  Negative for adenopathy. Does not bruise/bleed easily.  Psychiatric/Behavioral:  Positive for confusion,  decreased concentration and dysphoric mood. Negative for sleep disturbance and suicidal ideas. The patient is nervous/anxious.     Objective:  BP 122/82   Pulse 74   Temp (!) 97.5 F (36.4 C) (Oral)   Resp 16   Ht 5\' 7"  (1.702 m)   Wt 202 lb 3.2 oz (91.7 kg)   SpO2 99%   BMI 31.67 kg/m   BP Readings from Last 3 Encounters:  07/12/23 122/82  06/15/23 110/70  04/29/23 (!) 141/87    Wt Readings from Last 3 Encounters:  07/12/23 202 lb 3.2 oz (91.7 kg)  06/15/23 201 lb (91.2 kg)  04/29/23 205 lb (93 kg)    Physical Exam Vitals reviewed.  HENT:     Mouth/Throat:     Mouth: Mucous membranes are moist.  Eyes:     General: No scleral icterus.    Conjunctiva/sclera: Conjunctivae normal.  Cardiovascular:     Rate and Rhythm: Normal rate and regular rhythm.     Heart sounds: No murmur heard. Pulmonary:     Effort: Pulmonary effort is normal.     Breath sounds: No stridor. No wheezing, rhonchi or rales.  Abdominal:     General: Abdomen is flat.     Palpations: There is no mass.     Tenderness: There is no abdominal tenderness. There is no guarding.     Hernia: No hernia is present.  Musculoskeletal:        General: Normal range of motion.     Cervical back: Neck supple.     Right lower leg: No edema.     Left lower leg: No edema.  Lymphadenopathy:     Cervical: No cervical adenopathy.  Skin:    General: Skin is warm and dry.  Neurological:     Mental Status: She is alert. Mental status is at baseline.  Psychiatric:        Attention and Perception: She is inattentive.        Mood and Affect: Mood is anxious.        Speech: Speech is delayed and tangential.        Behavior: Behavior normal. Behavior is cooperative.        Thought Content: Thought content normal. Thought content is not paranoid or delusional. Thought content does not include homicidal or suicidal ideation.        Cognition and Memory: Cognition is impaired. Memory is impaired.     Lab Results   Component Value Date   WBC 5.1 03/23/2023   HGB 14.2 03/23/2023   HCT 43.5 03/23/2023   PLT 303.0 03/23/2023   GLUCOSE 93 03/23/2023   CHOL 169 03/23/2023   TRIG 68.0 03/23/2023   HDL 57.50 03/23/2023   LDLCALC 98 03/23/2023   ALT 23 03/23/2023   AST 23 03/23/2023   NA 141 03/23/2023   K 3.8 03/23/2023   CL 101 03/23/2023   CREATININE 0.90 03/23/2023   BUN 12 03/23/2023   CO2 32 03/23/2023   TSH 1.01 04/29/2023   HGBA1C 5.7 03/23/2023    MR BRAIN WO CONTRAST  Result Date: 06/03/2023 CLINICAL DATA:  Provided history: Memory impairment. EXAM: MRI HEAD WITHOUT CONTRAST TECHNIQUE: Multiplanar, multiecho pulse sequences of the brain and surrounding structures were obtained without intravenous contrast. COMPARISON:  Report from head CT 04/29/1999 (images unavailable). FINDINGS: Brain: Mild generalized cerebral atrophy. Multifocal T2 FLAIR hyperintense signal abnormality within the cerebral white matter, nonspecific but compatible with mild chronic small vessel ischemic disease. There is no acute infarct. No evidence of an intracranial mass. No chronic intracranial blood products. No extra-axial fluid collection. No midline shift. Vascular: Maintained flow voids within the proximal large arterial vessels. Skull and upper cervical spine: No focal suspicious marrow lesion. Sinuses/Orbits: No mass or acute finding within the imaged orbits. No significant paranasal sinus disease. IMPRESSION: 1. No evidence of an acute intracranial abnormality. 2. Mild chronic small vessel ischemic changes within the cerebral white matter. 3. Mild generalized cerebral atrophy. Electronically Signed   By: Jackey Loge D.O.   On: 06/03/2023 08:09    Assessment & Plan:   Major depressive disorder with single episode, in partial remission (HCC)- Will increase the SSRI dose. -     Sertraline HCl; Take 1 tablet (100 mg total) by mouth daily.  Dispense: 90 tablet; Refill: 0  Hyperlipidemia with target LDL less than  130 - LDL goal achieved. Doing well on the statin  -     Rosuvastatin Calcium; Take 1 tablet (10 mg total) by mouth daily.  Dispense: 90 tablet; Refill: 1  Moderate persistent asthma without complication -     Montelukast Sodium; Take 1 tablet (10 mg total) by mouth at bedtime.  Dispense: 90 tablet; Refill: 1  Seasonal allergic rhinitis due to pollen -     Montelukast Sodium; Take 1 tablet (10 mg total) by mouth at bedtime.  Dispense: 90 tablet; Refill: 1  Other orders -     Shingrix; Inject 0.5 mLs into the muscle once for 1 dose.  Dispense: 0.5 mL; Refill: 1     Follow-up: Return in about 3 months (around 10/12/2023).  Sanda Linger, MD

## 2023-07-12 NOTE — Patient Instructions (Signed)
Asthma, Adult  Asthma is a long-term (chronic) condition that causes recurrent episodes in which the lower airways in the lungs become tight and narrow. The narrowing is caused by inflammation and tightening of the smooth muscle around the lower airways. Asthma episodes, also called asthma attacks or asthma flares, may cause coughing, making high-pitched whistling sounds when you breathe, most often when you breathe out (wheezing), shortness of breath, and chest pain. The airways may produce extra mucus caused by the inflammation and irritation. During an attack, it can be difficult to breathe. Asthma attacks can range from minor to life-threatening. Asthma cannot be cured, but medicines and lifestyle changes can help control it and treat acute attacks. It is important to keep your asthma well controlled so the condition does not interfere with your daily life. What are the causes? This condition is believed to be caused by inherited (genetic) and environmental factors, but its exact cause is not known. What can trigger an asthma attack? Many things can bring on an asthma attack or make symptoms worse. These triggers are different for every person. Common triggers include: Allergens and irritants like mold, dust, pet dander, cockroaches, pollen, air pollution, and chemical odors. Cigarette smoke. Weather changes and cold air. Stress and strong emotional responses such as crying or laughing hard. Certain medications such as aspirin or beta blockers. Infections and inflammatory conditions, such as the flu, a cold, pneumonia, or inflammation of the nasal membranes (rhinitis). Gastroesophageal reflux disease (GERD). What are the signs or symptoms? Symptoms may occur right after exposure to an asthma trigger or hours later and can vary by person. Common signs and symptoms include: Wheezing. Trouble breathing (shortness of breath). Excessive nighttime or early morning coughing. Chest  tightness. Tiredness (fatigue) with minimal activity. Difficulty talking in complete sentences. Poor exercise tolerance. How is this diagnosed? This condition is diagnosed based on: A physical exam and your medical history. Tests, which may include: Lung function studies to evaluate the flow of air in your lungs. Allergy tests. Imaging tests, such as X-rays. How is this treated? There is no cure, but symptoms can be controlled with proper treatment. Treatment usually involves: Identifying and avoiding your asthma triggers. Inhaled medicines. Two types are commonly used to treat asthma, depending on severity: Controller medicines. These help prevent asthma symptoms from occurring. They are taken every day. Fast-acting reliever or rescue medicines. These quickly relieve asthma symptoms. They are used as needed and provide short-term relief. Using other medicines, such as: Allergy medicines, such as antihistamines, if your asthma attacks are triggered by allergens. Immune medicines (immunomodulators). These are medicines that help control the immune system. Using supplemental oxygen. This is only needed during a severe episode. Creating an asthma action plan. An asthma action plan is a written plan for managing and treating your asthma attacks. This plan includes: A list of your asthma triggers and how to avoid them. Information about when medicines should be taken and when their dosage should be changed. Instructions about using a device called a peak flow meter. A peak flow meter measures how well the lungs are working and the severity of your asthma. It helps you monitor your condition. Follow these instructions at home: Take over-the-counter and prescription medicines only as told by your health care provider. Stay up to date on all vaccinations as recommended by your healthcare provider, including vaccines for the flu and pneumonia. Use a peak flow meter and keep track of your peak flow  readings. Understand and use your asthma   action plan to address any asthma flares. Do not smoke or allow anyone to smoke in your home. Contact a health care provider if: You have wheezing, shortness of breath, or a cough that is not responding to medicines. Your medicines are causing side effects, such as a rash, itching, swelling, or trouble breathing. You need to use a reliever medicine more than 2-3 times a week. Your peak flow reading is still at 50-79% of your personal best after following your action plan for 1 hour. You have a fever and shortness of breath. Get help right away if: You are getting worse and do not respond to treatment during an asthma attack. You are short of breath when at rest or when doing very little physical activity. You have difficulty eating, drinking, or talking. You have chest pain or tightness. You develop a fast heartbeat or palpitations. You have a bluish color to your lips or fingernails. You are light-headed or dizzy, or you faint. Your peak flow reading is less than 50% of your personal best. You feel too tired to breathe normally. These symptoms may be an emergency. Get help right away. Call 911. Do not wait to see if the symptoms will go away. Do not drive yourself to the hospital. Summary Asthma is a long-term (chronic) condition that causes recurrent episodes in which the airways become tight and narrow. Asthma episodes, also called asthma attacks or asthma flares, can cause coughing, wheezing, shortness of breath, and chest pain. Asthma cannot be cured, but medicines and lifestyle changes can help keep it well controlled and prevent asthma flares. Make sure you understand how to avoid triggers and how and when to use your medicines. Asthma attacks can range from minor to life-threatening. Get help right away if you have an asthma attack and do not respond to treatment with your usual rescue medicines. This information is not intended to replace  advice given to you by your health care provider. Make sure you discuss any questions you have with your health care provider. Document Revised: 06/24/2021 Document Reviewed: 06/15/2021 Elsevier Patient Education  2024 [employer] Inc[employer]  

## 2023-07-13 ENCOUNTER — Other Ambulatory Visit (HOSPITAL_COMMUNITY): Payer: Self-pay

## 2023-07-13 ENCOUNTER — Other Ambulatory Visit: Payer: Self-pay

## 2023-07-15 ENCOUNTER — Other Ambulatory Visit (HOSPITAL_COMMUNITY): Payer: Self-pay

## 2023-07-15 ENCOUNTER — Other Ambulatory Visit: Payer: Self-pay

## 2023-07-25 ENCOUNTER — Ambulatory Visit: Payer: Medicare (Managed Care)

## 2023-07-25 ENCOUNTER — Other Ambulatory Visit (HOSPITAL_COMMUNITY): Payer: Self-pay

## 2023-07-26 ENCOUNTER — Other Ambulatory Visit (HOSPITAL_COMMUNITY): Payer: Self-pay

## 2023-08-01 ENCOUNTER — Other Ambulatory Visit (HOSPITAL_COMMUNITY): Payer: Self-pay

## 2023-08-02 ENCOUNTER — Ambulatory Visit: Payer: Medicare (Managed Care) | Admitting: Physician Assistant

## 2023-08-02 ENCOUNTER — Other Ambulatory Visit: Payer: Self-pay | Admitting: Obstetrics and Gynecology

## 2023-08-02 ENCOUNTER — Encounter: Payer: Self-pay | Admitting: Physician Assistant

## 2023-08-02 NOTE — Patient Outreach (Signed)
Medicaid Managed Care   Nurse Care Manager Note  08/02/2023 Name:  Alyssa Powell MRN:  [id number] DOB:  [birth year]  Alyssa Powell is an 72 y.o. year old female who is a primary patient of [name], MD.  The Medicaid Managed Care Coordination team was consulted for assistance with:    Chronic healthcare management needs, dementia, mild intellectual disability, depression, obesity, gout, HLD, HTN, asthma, rhinitis, osteoarthritis, preDM.  Alyssa Powell / Alyssa Powell was given information about Medicaid Managed Care Coordination team services today. Alyssa Powell Designated Party Release (Alyssa Powell) agreed to services and verbal consent obtained.  Engaged with patient / Alyssa Powell by telephone for follow up visit in response to provider referral for case management and/or care coordination services.   Assessments/Interventions:  Review of past medical history, allergies, medications, health status, including review of consultants reports, laboratory and other test data, was performed as part of comprehensive evaluation and provision of chronic care management services.  SDOH (Social Determinants of Health) assessments and interventions performed: SDOH Interventions    Flowsheet Row Patient Outreach Telephone from 08/02/2023 in [place] POPULATION HEALTH DEPARTMENT Patient Outreach Telephone from 07/01/2023 in [place] POPULATION HEALTH DEPARTMENT Patient Outreach Telephone from 06/28/2023 in [place] POPULATION HEALTH DEPARTMENT Patient Outreach Telephone from 06/01/2023 in [place] POPULATION HEALTH DEPARTMENT Patient Outreach Telephone from 05/25/2023 in [place] POPULATION HEALTH DEPARTMENT Patient Outreach Telephone from 05/03/2023 in [place] POPULATION HEALTH DEPARTMENT  SDOH Interventions        Food Insecurity Interventions -- -- -- Intervention Not Indicated -- --  Housing Interventions -- -- Intervention Not Indicated -- -- --  Transportation Interventions -- Intervention Not Indicated -- --  -- --  Utilities Interventions -- Intervention Not Indicated -- -- -- --  Alcohol Usage Interventions -- -- -- Intervention Not Indicated (Score <7) -- --  Financial Strain Interventions Intervention Not Indicated -- -- -- -- --  Stress Interventions -- -- -- -- [employer], Provide Counseling  [Due to keeping up with pt's multiple medical appointments and sister's Sunny recent surgery who is a secondary caregiver] [employer], Provide Counseling  [employer] had recent surgery so Sister Gunnar Fusi is doing it all for entire family and is experiencing caregiver burnout]  Health Literacy Interventions Intervention Not Indicated  [confusion, decreased concentration , sister and brother assists] -- -- -- -- --     Care Plan Allergies  Allergen Reactions   Amoxicillin Hives   Codeine Phosphate Hives   Penicillins     Childhood allergy   Medications Reviewed Today     Reviewed by Alyssa Chandler, RN (Registered Nurse) on 08/02/23 at 1113  Med List Status: <None>   Medication Order Taking? Sig Documenting Provider Last Dose Status Informant  albuterol (PROVENTIL) 4 MG tablet [id number] No Take 1 tablet (4 mg total) by mouth 3 (three) times daily. [name], MD Taking Active   budesonide (PULMICORT) 0.5 MG/2ML nebulizer solution [id number] No Take 2 mLs (0.5 mg total) by nebulization 2 (two) times daily. [name], MD Taking Active   cetirizine (ZYRTEC) 10 MG tablet [id number] No TAKE 1 TABLET BY MOUTH EVERYDAY AT BEDTIME [name], MD Taking Active   colchicine 0.6 MG tablet [id number] No TAKE 1 TABLET BY MOUTH TWICE A DAY  Patient not taking: Reported on 07/12/2023   [name], MD Not Taking Active   memantine [facility]) 5 MG tablet [id number] No Take 1 tablet (5 mg at night) for 2  weeks, then increase to 1 tablet (5 mg) twice a day  Patient not taking: Reported on 07/12/2023   [name], PA-C Not Taking Active   montelukast  (SINGULAIR) 10 MG tablet [id number]  Take 1 tablet (10 mg total) by mouth at bedtime. [name], MD  Active   pantoprazole (PROTONIX) 40 MG tablet [id number] No TAKE 1 TABLET BY MOUTH EVERY DAY [name], MD Taking Active   rosuvastatin (CRESTOR) 10 MG tablet [id number]  Take 1 tablet (10 mg total) by mouth daily. [name], MD  Active   sertraline (ZOLOFT) 100 MG tablet [id number]  Take 1 tablet (100 mg total) by mouth daily. [name], MD  Active   Zoster Vaccine Adjuvanted [facility]) injection [id number]  Inject 0.5 mLs into the muscle once for 1 dose. 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) 03/23/2023   Need for vaccination 07/04/2022   Encounter for general adult medical examination with abnormal findings 07/04/2022   Class 1 obesity due to excess calories with serious comorbidity and body mass index (BMI) of 34.0 to 34.9 in adult 06/28/2022   Flu vaccine need 06/28/2022   Screen for colon cancer 06/28/2022   Visit for screening mammogram 06/28/2022   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/28/2022   Hyperlipidemia with target LDL less than 130 06/28/2022   Moderate persistent asthma 09/05/2018   Essential hypertension 09/05/2018   Major depressive disorder with single episode 09/05/2018   Osteoarthritis, knee 12/16/2011   Allergic rhinitis 08/07/2009   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  Care Plan : RN Care Manager Plan of Care  Updates made by Alyssa Chandler, RN since 08/02/2023 12:00 AM     Problem: Health Promotion or Disease Self-Management (General Plan of Care)       Long-Range Goal: Chronic Disease Management and Care Coordination Needs   Start Date: 06/01/2023  Expected End Date: 11/02/2023  Priority: High  Note:   Current Barriers:  Knowledge Deficits related to plan of care for management of dementia, mild intellectual disability, depression, obesity, gout, HLD, prediabetes, HTN, asthma, rhinitis, osteoarthritis Care Coordination needs related to dementia, mild intellectual disability, depression, obesity, gout, HLD, prediabetes, HTN, asthma, rhinitis, osteoarthritis Chronic Disease Management support and education needs related to dementia, mild intellectual disability, depression, obesity, gout, HLD, prediabetes, HTN, asthma, rhinitis, osteoarthritis Literacy barriers Cognitive Deficits 08/02/23:  To have colonoscopy-will contact PCP office regarding questions about MRI results and possibly PT.  Doing well per patient's sister.  BP and breathing WNL.  Upcoming dental appt.  RNCM Clinical Goal(s):  Patient will verbalize understanding of plan for management of  dementia, mild intellectual disability, depression, obesity, gout, HLD, prediabetes, HTN, asthma, rhinitis, osteoarthritis  as evidenced by Alyssa Powell, sister's report verbalize basic understanding of  dementia, mild intellectual disability, depression, obesity, gout, HLD, prediabetes, HTN, asthma, rhinitis, osteoarthritis disease process and self health management plan as evidenced by Alyssa Powell, sister's report take all medications exactly as prescribed and will call provider for medication related questions as evidenced by Alyssa Powell, sister's report demonstrate understanding of rationale for each prescribed medication as evidenced by Alyssa Powell, sister's report attend all scheduled  medical appointments as evidenced by Alyssa Powell, sister's report and EMR review demonstrate ongoing adherence to prescribed treatment plan for dementia, mild intellectual disability, depression, obesity, gout, HLD, prediabetes, HTN, asthma,  rhinitis, osteoarthritis   as evidenced by Alyssa Powell, sister's report and EMR review continue to work with RN Care Manager to address care management and care coordination needs related to  dementia, mild intellectual disability, depression, obesity, gout, HLD, prediabetes, HTN, asthma, rhinitis, osteoarthritis as evidenced by adherence to CM Team Scheduled appointments work with Child psychotherapist  related to the management of  dementia, mild intellectual disability, depression  as evidenced by review of EMR and patient or Child psychotherapist report through collaboration with Medical illustrator, provider, and care team.   Interventions: Inter-disciplinary care team collaboration (see longitudinal plan of care) Evaluation of current treatment plan related to  self management and patient's adherence to plan as established by provider  Asthma: (Status:New goal.) Long Term Goal Advised Alyssa Powell. Patient;s sister to track and manage Asthma triggers Provided instruction about proper use of medications used for management of Asthma including inhalers Discussed the importance of adequate rest and management of fatigue with Asthma Assessed social determinant of health barriers   Hyperlipidemia Interventions:  (Status:  New goal.) Long Term Goal Medication review performed; medication list updated in electronic medical record.  Provider established cholesterol goals reviewed Counseled on importance of regular laboratory monitoring as prescribed Reviewed importance of limiting foods high in cholesterol Assessed social determinant of health barriers   Hypertension Interventions:  (Status:  New goal.) Long Term Goal Last practice recorded BP readings:  BP Readings from Last 3 Encounters:  04/29/23 (!) 141/87  07/12/23 122/82  06/15/23 110/70   Most recent eGFR/CrCl: No results found for: "EGFR"  No components found for: "CRCL"  Evaluation of current treatment plan related to hypertension self management and patient's/ Alyssa Powell.  Patient's sister  adherence to plan as established by provider Reviewed medications with patient/  Alyssa Powell. Patient's sister and discussed importance of compliance Discussed plans with patient/ Alyssa Powell. Patient's sister ,  for ongoing care management follow up and provided patient / Alyssa Powell. Patient's sister with direct contact information for care management team Advised patient/ Alyssa Powell. Patient's sister ,  providing education and rationale, to monitor blood pressure daily and record, calling PCP for findings outside established parameters Reviewed scheduled/upcoming provider appointments  Assessed social determinant of health barriers  Dementia:  (Status:  New goal.)  Long Term Goal Evaluation of current treatment plan related to misuse of: Dementia with agitation, Dementia with mood disturbances, Dementia with other behavioral disturbances, and Dementia with psychotic disturbances Collaborated with Alyssa Powell, patient's sister, Reviewed medications,  Emotional Support Provided to patient/caregiver, Sleep assessment completed, Encouraged patient/caregiver counseling/support, Referred to LCSW for assistance with Guardianship discussion, Discussed importance of discussing diagnosis with provider, Discussed importance of attendance to all provider appointments, and Advised to contact provider for new or worsening symptoms  Patient Goals/Self-Care Activities: Take all medications as prescribed Attend all scheduled provider appointments Call pharmacy for medication refills 3-7 days in advance of running out of medications Perform all self care activities independently  Perform IADL's (shopping, preparing meals, housekeeping, managing finances) independently Call provider office for new concerns or questions  Work with the social worker to address care coordination needs and will continue to work with the clinical team to address health care and disease management related needs 08/02/23:  patient's sister to f/u on  colonoscopy, MRI results, and ? PT.  Follow Up Plan:  The patient/ Alyssa Powell.  Patient's sister,   has been provided with contact information for the care management team and has been advised to call with any health related questions or concerns.  The care management team will reach out to the patient/ Alyssa Powell. Patient's sister,  again over the next 30 business  days.    Long-Range Goal: Establish Plan of Care for Chronic Disease Management Needs   Priority: High  Note:   Timeframe:  Long-Range Goal Priority:  High Start Date: 06/01/23                            Expected End Date:   ongoing                    Follow Up Date  09/01/23   - schedule appointment for vaccines needed due to my age or health - schedule recommended health tests (blood work, mammogram, colonoscopy, pap test) - schedule and keep appointment for annual check-up    Why is this important?   Screening tests can find diseases early when they are easier to treat.  Your doctor or nurse will talk with you about which tests are important for you.  Getting shots for common diseases like the flu and shingles will help prevent them.   08/02/23:  NEURO appt today, [facility] 12/4.   Follow Up:  Patient / Alyssa Powell agrees to Care Plan and Follow-up.  Plan: The Managed Medicaid care management team will reach out to the patient/ Alyssa Powell again over the next 30 business  days. and The  [name] (Alyssa Powell) has been provided with contact information for the Managed Medicaid care management team and has been advised to call with any health related questions or concerns.  Date/time of next scheduled RN care management/care coordination outreach: 08/02/23 at 1230

## 2023-08-02 NOTE — Patient Instructions (Signed)
Visit Information  Ms. Clearance Coots / DPR was given information about Medicaid Managed Care team care coordination services and verbally consented to engagement with the [facility] Managed Care team.   Ms. Rosenau / DPR - following are the goals we discussed in your visit today:   Goals Addressed    Timeframe:  Long-Range Goal Priority:  High Start Date: 06/01/23                            Expected End Date:   ongoing                    Follow Up Date  09/01/23   - schedule appointment for vaccines needed due to my age or health - schedule recommended health tests (blood work, mammogram, colonoscopy, pap test) - schedule and keep appointment for annual check-up    Why is this important?   Screening tests can find diseases early when they are easier to treat.  Your doctor or nurse will talk with you about which tests are important for you.  Getting shots for common diseases like the flu and shingles will help prevent them.   08/02/23:  NEURO appt today, MMG 12/4.  The patient / DPR verbalized understanding of instructions, educational materials, and care plan provided today and DECLINED offer to receive copy of patient/ DPR  instructions, educational materials, and care plan.   The Managed Medicaid care management team will reach out to the patient / DPR again over the next 30 business  days.  The  [name] (DPR) has been provided with contact information for the Managed Medicaid care management team and has been advised to call with any health related questions or concerns.   Following is a copy of your plan of care:  Care Plan : RN Care Manager Plan of Care  Updates made by [name], RN since 08/02/2023 12:00 AM     Problem: Health Promotion or Disease Self-Management (General Plan of Care)      Long-Range Goal: Chronic Disease Management and Care Coordination Needs   Start Date: 06/01/2023  Expected End Date: 11/02/2023  Priority: High  Note:   Current Barriers:   Knowledge Deficits related to plan of care for management of dementia, mild intellectual disability, depression, obesity, gout, HLD, prediabetes, HTN, asthma, rhinitis, osteoarthritis Care Coordination needs related to dementia, mild intellectual disability, depression, obesity, gout, HLD, prediabetes, HTN, asthma, rhinitis, osteoarthritis Chronic Disease Management support and education needs related to dementia, mild intellectual disability, depression, obesity, gout, HLD, prediabetes, HTN, asthma, rhinitis, osteoarthritis Literacy barriers Cognitive Deficits 08/02/23:  To have colonoscopy-will contact PCP office regarding questions about MRI results and possibly PT.  Doing well per patient's sister.  BP and breathing WNL.  Upcoming dental appt.  RNCM Clinical Goal(s):  Patient will verbalize understanding of plan for management of  dementia, mild intellectual disability, depression, obesity, gout, HLD, prediabetes, HTN, asthma, rhinitis, osteoarthritis  as evidenced by DPR, sister's report verbalize basic understanding of  dementia, mild intellectual disability, depression, obesity, gout, HLD, prediabetes, HTN, asthma, rhinitis, osteoarthritis disease process and self health management plan as evidenced by DPR, sister's report take all medications exactly as prescribed and will call provider for medication related questions as evidenced by DPR, sister's report demonstrate understanding of rationale for each prescribed medication as evidenced by DPR, sister's report attend all scheduled medical appointments as evidenced by DPR, sister's report and EMR review demonstrate ongoing adherence  to prescribed treatment plan for dementia, mild intellectual disability, depression, obesity, gout, HLD, prediabetes, HTN, asthma, rhinitis, osteoarthritis   as evidenced by DPR, sister's report and EMR review continue to work with RN Care Manager to address care management and care coordination needs related to   dementia, mild intellectual disability, depression, obesity, gout, HLD, prediabetes, HTN, asthma, rhinitis, osteoarthritis as evidenced by adherence to CM Team Scheduled appointments work with Child psychotherapist  related to the management of  dementia, mild intellectual disability, depression  as evidenced by review of EMR and patient or Child psychotherapist report through collaboration with Medical illustrator, provider, and care team.   Interventions: Inter-disciplinary care team collaboration (see longitudinal plan of care) Evaluation of current treatment plan related to  self management and patient's adherence to plan as established by provider  Asthma: (Status:New goal.) Long Term Goal Advised DPR. Patient;s sister to track and manage Asthma triggers Provided instruction about proper use of medications used for management of Asthma including inhalers Discussed the importance of adequate rest and management of fatigue with Asthma Assessed social determinant of health barriers   Hyperlipidemia Interventions:  (Status:  New goal.) Long Term Goal Medication review performed; medication list updated in electronic medical record.  Provider established cholesterol goals reviewed Counseled on importance of regular laboratory monitoring as prescribed Reviewed importance of limiting foods high in cholesterol Assessed social determinant of health barriers   Hypertension Interventions:  (Status:  New goal.) Long Term Goal Last practice recorded BP readings:  BP Readings from Last 3 Encounters:  04/29/23 (!) 141/87  07/12/23 122/82  06/15/23 110/70   Most recent eGFR/CrCl: No results found for: "EGFR"  No components found for: "CRCL"  Evaluation of current treatment plan related to hypertension self management and patient's/ DPR. Patient's sister  adherence to plan as established by provider Reviewed medications with patient/  DPR. Patient's sister and discussed importance of compliance Discussed plans with  patient/ DPR. Patient's sister ,  for ongoing care management follow up and provided patient / DPR. Patient's sister with direct contact information for care management team Advised patient/ DPR. Patient's sister ,  providing education and rationale, to monitor blood pressure daily and record, calling PCP for findings outside established parameters Reviewed scheduled/upcoming provider appointments  Assessed social determinant of health barriers  Dementia:  (Status:  New goal.)  Long Term Goal Evaluation of current treatment plan related to misuse of: Dementia with agitation, Dementia with mood disturbances, Dementia with other behavioral disturbances, and Dementia with psychotic disturbances Collaborated with DPR, patient's sister, Reviewed medications,  Emotional Support Provided to patient/caregiver, Sleep assessment completed, Encouraged patient/caregiver counseling/support, Referred to LCSW for assistance with Guardianship discussion, Discussed importance of discussing diagnosis with provider, Discussed importance of attendance to all provider appointments, and Advised to contact provider for new or worsening symptoms  Patient Goals/Self-Care Activities: Take all medications as prescribed Attend all scheduled provider appointments Call pharmacy for medication refills 3-7 days in advance of running out of medications Perform all self care activities independently  Perform IADL's (shopping, preparing meals, housekeeping, managing finances) independently Call provider office for new concerns or questions  Work with the social worker to address care coordination needs and will continue to work with the clinical team to address health care and disease management related needs 08/02/23:  patient's sister to f/u on colonoscopy, MRI results, and ? PT.  Follow Up Plan:  The patient/ DPR. Patient's sister,   has been provided with contact information for the care management team  and has been advised to  call with any health related questions or concerns.  The care management team will reach out to the patient/ DPR. Patient's sister,  again over the next 30 business  days.   Kathi Der RN, BSN [place]  [employer] - Managed Medicaid High Risk [phone]

## 2023-08-23 ENCOUNTER — Ambulatory Visit: Payer: Medicare (Managed Care)

## 2023-08-24 ENCOUNTER — Ambulatory Visit: Payer: Medicare (Managed Care)

## 2023-09-01 ENCOUNTER — Ambulatory Visit: Payer: Self-pay | Admitting: Obstetrics and Gynecology

## 2023-09-05 ENCOUNTER — Other Ambulatory Visit (HOSPITAL_COMMUNITY): Payer: Self-pay

## 2023-09-05 ENCOUNTER — Other Ambulatory Visit: Payer: Self-pay | Admitting: Internal Medicine

## 2023-09-05 DIAGNOSIS — F324 Major depressive disorder, single episode, in partial remission: Secondary | ICD-10-CM

## 2023-09-05 DIAGNOSIS — Z1231 Encounter for screening mammogram for malignant neoplasm of breast: Secondary | ICD-10-CM

## 2023-09-05 MED ORDER — SERTRALINE HCL 100 MG PO TABS
100.0000 mg | ORAL_TABLET | Freq: Every day | ORAL | 0 refills | Status: DC
  Filled 2023-09-05 – 2023-10-25 (×2): qty 90, 90d supply, fill #0

## 2023-09-06 ENCOUNTER — Other Ambulatory Visit (HOSPITAL_COMMUNITY): Payer: Self-pay

## 2023-09-06 ENCOUNTER — Other Ambulatory Visit: Payer: Self-pay

## 2023-09-06 MED ORDER — ALBUTEROL SULFATE HFA 108 (90 BASE) MCG/ACT IN AERS
1.0000 | INHALATION_SPRAY | Freq: Four times a day (QID) | RESPIRATORY_TRACT | 2 refills | Status: DC | PRN
  Filled 2023-09-06 – 2023-10-21 (×3): qty 18, 25d supply, fill #0

## 2023-09-06 MED ORDER — MONTELUKAST SODIUM 10 MG PO TABS
10.0000 mg | ORAL_TABLET | Freq: Every day | ORAL | 1 refills | Status: DC
  Filled 2023-09-06 – 2024-01-16 (×3): qty 90, 90d supply, fill #0
  Filled 2024-04-16: qty 90, 90d supply, fill #1

## 2023-09-07 ENCOUNTER — Other Ambulatory Visit (HOSPITAL_COMMUNITY): Payer: Self-pay

## 2023-09-07 ENCOUNTER — Other Ambulatory Visit: Payer: Self-pay

## 2023-09-07 MED FILL — Colchicine Tab 0.6 MG: ORAL | 90 days supply | Qty: 180 | Fill #0 | Status: CN

## 2023-09-08 ENCOUNTER — Ambulatory Visit: Payer: Medicare (Managed Care)

## 2023-09-08 ENCOUNTER — Other Ambulatory Visit: Payer: Self-pay

## 2023-09-12 ENCOUNTER — Other Ambulatory Visit: Payer: Self-pay | Admitting: Family Medicine

## 2023-09-12 DIAGNOSIS — F325 Major depressive disorder, single episode, in full remission: Secondary | ICD-10-CM

## 2023-09-13 ENCOUNTER — Other Ambulatory Visit: Payer: Self-pay

## 2023-09-21 ENCOUNTER — Other Ambulatory Visit: Payer: Self-pay | Admitting: Internal Medicine

## 2023-09-21 DIAGNOSIS — E785 Hyperlipidemia, unspecified: Secondary | ICD-10-CM

## 2023-09-26 ENCOUNTER — Ambulatory Visit: Payer: Self-pay | Admitting: Obstetrics and Gynecology

## 2023-09-29 ENCOUNTER — Ambulatory Visit: Payer: Medicare (Managed Care)

## 2023-09-30 ENCOUNTER — Other Ambulatory Visit: Payer: Self-pay | Admitting: Obstetrics and Gynecology

## 2023-09-30 NOTE — Patient Outreach (Signed)
 Medicaid Managed Care   Nurse Care Manager Note  09/30/2023 Name:  Alyssa Powell MRN:  [id number] DOB:  [birth year]  Alyssa Powell is an 73 y.o. year old female who is a primary patient of Joshua Debby CROME, MD.  The [facility] Managed Care Coordination team was consulted for assistance with:    Chronic healthcare management needs, dementia, mild intellectual disability, depression, obesity, HLD, asthma, rhinitis, osteoarthritis, preDM, HTN  [name] was given information about Medicaid Managed Care Coordination team services today. [name] Patient agreed to services and verbal consent obtained.  Engaged with patient by telephone for follow up visit in response to provider referral for case management and/or care coordination services.   Patient is participating in a Managed Medicaid Plan:  no  Assessments/Interventions:  Review of past medical history, allergies, medications, health status, including review of consultants reports, laboratory and other test data, was performed as part of comprehensive evaluation and provision of chronic care management services.  SDOH (Social Drivers of Health) assessments and interventions performed: SDOH Interventions    Flowsheet Row Patient Outreach Telephone from 09/30/2023 in [place] [facility] POPULATION HEALTH DEPARTMENT Patient Outreach Telephone from 08/02/2023 in [place] POPULATION HEALTH DEPARTMENT Patient Outreach Telephone from 07/01/2023 in [place] POPULATION HEALTH DEPARTMENT Patient Outreach Telephone from 06/28/2023 in [place] POPULATION HEALTH DEPARTMENT Patient Outreach Telephone from 06/01/2023 in [place] POPULATION HEALTH DEPARTMENT Patient Outreach Telephone from 05/25/2023 in [place] POPULATION HEALTH DEPARTMENT  SDOH Interventions        Food Insecurity Interventions -- -- -- -- Intervention Not Indicated --  Housing Interventions -- -- -- Intervention Not Indicated -- --  Transportation Interventions -- -- Intervention Not  Indicated -- -- --  Utilities Interventions -- -- Intervention Not Indicated -- -- --  Alcohol Usage Interventions -- -- -- -- Intervention Not Indicated (Score <7) --  Financial Strain Interventions -- Intervention Not Indicated -- -- -- --  Physical Activity Interventions Intervention Not Indicated -- -- -- -- --  Stress Interventions -- -- -- -- -- Offered [employer], Provide Counseling  [Due to keeping up with pt's multiple medical appointments and sister's Sunny recent surgery who is a secondary caregiver]  Social Connections Interventions Intervention Not Indicated -- -- -- -- --  Health Literacy Interventions -- Intervention Not Indicated  [confusion, decreased concentration , sister and brother assists] -- -- -- --     Care Plan Allergies  Allergen Reactions   Amoxicillin Hives   Codeine Phosphate Hives   Penicillins     Childhood allergy   Medications Reviewed Today     Reviewed by Milon Veva MATSU, RN (Registered Nurse) on 09/30/23 at 1030  Med List Status: <None>   Medication Order Taking? Sig Documenting Provider Last Dose Status Informant  albuterol  (PROVENTIL ) 4 MG tablet [id number] No Take 1 tablet (4 mg total) by mouth 3 (three) times daily. Johnny Garnette LABOR, MD Taking Active   albuterol  (VENTOLIN  HFA) [phone] Base) MCG/ACT inhaler [id number]  Inhale 1-2 puffs into the lungs every 6 (six) hours as needed.   Active   budesonide  (PULMICORT ) 0.5 MG/2ML nebulizer solution [id number] No Take 2 mLs (0.5 mg total) by nebulization 2 (two) times daily. Joshua Debby CROME, MD Taking Active   cetirizine  (ZYRTEC ) 10 MG tablet [id number] No TAKE 1 TABLET BY MOUTH EVERYDAY AT BEDTIME Tanda Bleacher, MD Taking Active   colchicine  0.6 MG tablet [id number] No Take 1 tablet (0.6 mg total) by mouth 2 (two) times daily. Joshua Debby  L, MD Not Taking Active   memantine  (NAMENDA ) 5 MG tablet [id number] No Take 1 tablet (5 mg total) by mouth 2 (two) times daily. Wertman, Sara E, PA-C Not  Taking Active   montelukast  (SINGULAIR ) 10 MG tablet [id number]  Take 1 tablet (10 mg total) by mouth at bedtime. Joshua Debby CROME, MD  Active   montelukast  (SINGULAIR ) 10 MG tablet [id number]  Take 1 tablet (10 mg total) by mouth at bedtime. Johnny Garnette LABOR, MD  Active   pantoprazole  (PROTONIX ) 40 MG tablet [id number] No TAKE 1 TABLET BY MOUTH EVERY DAY Tanda Bleacher, MD Taking Active   rosuvastatin  (CRESTOR ) 10 MG tablet [id number]  Take 1 tablet (10 mg total) by mouth daily. Joshua Debby CROME, MD  Active   sertraline  (ZOLOFT ) 100 MG tablet [id number]  Take 1 tablet (100 mg total) by mouth daily. Joshua Debby CROME, MD  Active   Zoster Vaccine Adjuvanted (SHINGRIX ) injection [id number]  Inject 0.5 mLs into the muscle once for 1 dose. 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) 03/23/2023   Need for vaccination 07/04/2022   Encounter for general adult medical examination with abnormal findings 07/04/2022   Class 1 obesity due to excess calories with serious comorbidity and body mass index (BMI) of 34.0 to 34.9 in adult 06/28/2022   Flu vaccine need 06/28/2022   Screen for colon cancer 06/28/2022   Visit for screening mammogram 06/28/2022   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/28/2022   Hyperlipidemia with target LDL less than 130 06/28/2022   Moderate persistent asthma 09/05/2018   Essential hypertension 09/05/2018   Major depressive disorder with single episode 09/05/2018   Osteoarthritis, knee 12/16/2011   Allergic rhinitis 08/07/2009   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   Conditions to be addressed/monitored per PCP order:  Chronic healthcare management needs, dementia, mild intellectual disability, depression, obesity, HLD, asthma, rhinitis, osteoarthritis, preDM, HTN  Care Plan : RN Care Manager Plan of Care  Updates  made by Milon Veva MATSU, RN since 09/30/2023 12:00 AM     Problem: Health Promotion or Disease Self-Management (General Plan of Care)      Long-Range Goal: Chronic Disease Management and Care Coordination Needs   Start Date: 06/01/2023  Expected End Date: 12/29/2023  Priority: High  Note:   Current Barriers:  Knowledge Deficits related to plan of care for management of dementia, mild intellectual disability, depression, obesity, gout, HLD, prediabetes, HTN, asthma, rhinitis, osteoarthritis Care Coordination needs related to dementia, mild intellectual disability, depression, obesity, gout, HLD, prediabetes, HTN, asthma, rhinitis, osteoarthritis Chronic Disease Management support and education needs related to dementia, mild intellectual disability, depression, obesity, gout, HLD, prediabetes, HTN, asthma, rhinitis, osteoarthritis Literacy barriers Cognitive Deficits 09/30/23:  patient doing well per patient's sister-MMG to be rescheduled due to weather.  BP not checked at home.  Breathing okay right now.  RNCM Clinical Goal(s):  Patient will verbalize understanding of plan for management of  dementia, mild intellectual disability, depression, obesity, gout, HLD, prediabetes, HTN, asthma, rhinitis, osteoarthritis  as evidenced by DPR, sister's report verbalize basic understanding of  dementia, mild intellectual disability, depression, obesity, gout, HLD, prediabetes, HTN, asthma, rhinitis, osteoarthritis disease process and self health management plan as evidenced by DPR, sister's report take all medications exactly as prescribed and will call provider for medication related  questions as evidenced by DPR, sister's report demonstrate understanding of rationale for each prescribed medication as evidenced by DPR, sister's report attend all scheduled medical appointments as evidenced by DPR, sister's report and EMR review demonstrate ongoing adherence to prescribed treatment plan for dementia, mild  intellectual disability, depression, obesity, gout, HLD, prediabetes, HTN, asthma, rhinitis, osteoarthritis   as evidenced by DPR, sister's report and EMR review continue to work with RN Care Manager to address care management and care coordination needs related to  dementia, mild intellectual disability, depression, obesity, gout, HLD, prediabetes, HTN, asthma, rhinitis, osteoarthritis as evidenced by adherence to CM Team Scheduled appointments work with child psychotherapist  related to the management of  dementia, mild intellectual disability, depression  as evidenced by review of EMR and patient or child psychotherapist report through collaboration with Medical Illustrator, provider, and care team.   Interventions: Inter-disciplinary care team collaboration (see longitudinal plan of care) Evaluation of current treatment plan related to  self management and patient's adherence to plan as established by provider  Asthma: (Status:New goal.) Long Term Goal Advised DPR. Patient;s sister to track and manage Asthma triggers Provided instruction about proper use of medications used for management of Asthma including inhalers Discussed the importance of adequate rest and management of fatigue with Asthma Assessed social determinant of health barriers   Hyperlipidemia Interventions:  (Status:  New goal.) Long Term Goal Medication review performed; medication list updated in electronic medical record.  Provider established cholesterol goals reviewed Counseled on importance of regular laboratory monitoring as prescribed Reviewed importance of limiting foods high in cholesterol Assessed social determinant of health barriers   Hypertension Interventions:  (Status:  New goal.) Long Term Goal Last practice recorded BP readings:  BP Readings from Last 3 Encounters:  04/29/23 (!) 141/87  07/12/23 122/82  06/15/23 110/70   Most recent eGFR/CrCl: No results found for: EGFR  No components found for: CRCL  Evaluation of  current treatment plan related to hypertension self management and patient's/ DPR. Patient's sister  adherence to plan as established by provider Reviewed medications with patient/  DPR. Patient's sister and discussed importance of compliance Discussed plans with patient/ DPR. Patient's sister ,  for ongoing care management follow up and provided patient / DPR. Patient's sister with direct contact information for care management team Advised patient/ DPR. Patient's sister ,  providing education and rationale, to monitor blood pressure daily and record, calling PCP for findings outside established parameters Reviewed scheduled/upcoming provider appointments  Assessed social determinant of health barriers  Dementia:  (Status:  New goal.)  Long Term Goal Evaluation of current treatment plan related to misuse of: Dementia with agitation, Dementia with mood disturbances, Dementia with other behavioral disturbances, and Dementia with psychotic disturbances Collaborated with DPR, patient's sister, Reviewed medications,  Emotional Support Provided to patient/caregiver, Sleep assessment completed, Encouraged patient/caregiver counseling/support, Referred to LCSW for assistance with Guardianship discussion, Discussed importance of discussing diagnosis with provider, Discussed importance of attendance to all provider appointments, and Advised to contact provider for new or worsening symptoms  Patient Goals/Self-Care Activities: Take all medications as prescribed Attend all scheduled provider appointments Call pharmacy for medication refills 3-7 days in advance of running out of medications Perform all self care activities independently  Perform IADL's (shopping, preparing meals, housekeeping, managing finances) independently Call provider office for new concerns or questions  Work with the social worker to address care coordination needs and will continue to work with the clinical team to address health care  and disease  management related needs 08/02/23:  patient's sister to f/u on colonoscopy, MRI results, and ? PT.  Follow Up Plan:  The patient/ DPR. Patient's sister,   has been provided with contact information for the care management team and has been advised to call with any health related questions or concerns.  The care management team will reach out to the patient/ DPR. Patient's sister,  again over the next 60 business  days.    Long-Range Goal: Establish Plan of Care for Chronic Disease Management Needs   Priority: High  Note:   Timeframe:  Long-Range Goal Priority:  High Start Date: 06/01/23                            Expected End Date:   ongoing                    Follow Up Date 11/28/23   - schedule appointment for vaccines needed due to my age or health - schedule recommended health tests (blood work, mammogram, colonoscopy, pap test) - schedule and keep appointment for annual check-up    Why is this important?   Screening tests can find diseases early when they are easier to treat.  Your doctor or nurse will talk with you about which tests are important for you.  Getting shots for common diseases like the flu and shingles will help prevent them.   09/30/23:  MMG rescheduled d/t weather.  PCP appt this month   Follow Up:  Patient / DPR agrees to Care Plan and Follow-up.  Plan: The Managed Medicaid care management team will reach out to the patient / DPR again over the next 60 business  days. and The  [name] (DPR) has been provided with contact information for the Managed Medicaid care management team and has been advised to call with any health related questions or concerns.  Date/time of next scheduled RN care management/care coordination outreach:  11/28/23 at 0900

## 2023-09-30 NOTE — Patient Instructions (Signed)
 Visit Information  Ms. Gengler/ DPR  was given information about Medicaid Managed Care team care coordination services and verbally consented to engagement with the [facility] Managed Care team.   Ms. Waln/ Ms. Blair - following are the goals we discussed in the visit today:   Goals Addressed    Timeframe:  Long-Range Goal Priority:  High Start Date: 06/01/23                            Expected End Date:   ongoing                    Follow Up Date 11/28/23   - schedule appointment for vaccines needed due to my age or health - schedule recommended health tests (blood work, mammogram, colonoscopy, pap test) - schedule and keep appointment for annual check-up    Why is this important?   Screening tests can find diseases early when they are easier to treat.  Your doctor or nurse will talk with you about which tests are important for you.  Getting shots for common diseases like the flu and shingles will help prevent them.   09/30/23:  MMG rescheduled d/t weather.  PCP appt this month  The patient / DPR verbalized understanding of instructions, educational materials, and care plan provided today and DECLINED offer to receive copy of patient instructions, educational materials, and care plan.   The Managed Medicaid care management team will reach out to the patient / DPR again over the next 60 business  days.  The  [name] (DPR) has been provided with contact information for the Managed Medicaid care management team and has been advised to call with any health related questions or concerns.   Following is a copy of your plan of care:  Care Plan : RN Care Manager Plan of Care  Updates made by Milon Veva MATSU, RN since 09/30/2023 12:00 AM     Problem: Health Promotion or Disease Self-Management (General Plan of Care)      Long-Range Goal: Chronic Disease Management and Care Coordination Needs   Start Date: 06/01/2023  Expected End Date: 12/29/2023  Priority: High  Note:    Current Barriers:  Knowledge Deficits related to plan of care for management of dementia, mild intellectual disability, depression, obesity, gout, HLD, prediabetes, HTN, asthma, rhinitis, osteoarthritis Care Coordination needs related to dementia, mild intellectual disability, depression, obesity, gout, HLD, prediabetes, HTN, asthma, rhinitis, osteoarthritis Chronic Disease Management support and education needs related to dementia, mild intellectual disability, depression, obesity, gout, HLD, prediabetes, HTN, asthma, rhinitis, osteoarthritis Literacy barriers Cognitive Deficits 09/30/23:  patient doing well per patient's sister-MMG to be rescheduled due to weather.  BP not checked at home.  Breathing okay right now.  RNCM Clinical Goal(s):  Patient will verbalize understanding of plan for management of  dementia, mild intellectual disability, depression, obesity, gout, HLD, prediabetes, HTN, asthma, rhinitis, osteoarthritis  as evidenced by DPR, sister's report verbalize basic understanding of  dementia, mild intellectual disability, depression, obesity, gout, HLD, prediabetes, HTN, asthma, rhinitis, osteoarthritis disease process and self health management plan as evidenced by DPR, sister's report take all medications exactly as prescribed and will call provider for medication related questions as evidenced by DPR, sister's report demonstrate understanding of rationale for each prescribed medication as evidenced by DPR, sister's report attend all scheduled medical appointments as evidenced by DPR, sister's report and EMR review demonstrate ongoing adherence to prescribed treatment plan for  dementia, mild intellectual disability, depression, obesity, gout, HLD, prediabetes, HTN, asthma, rhinitis, osteoarthritis   as evidenced by DPR, sister's report and EMR review continue to work with RN Care Manager to address care management and care coordination needs related to  dementia, mild intellectual  disability, depression, obesity, gout, HLD, prediabetes, HTN, asthma, rhinitis, osteoarthritis as evidenced by adherence to CM Team Scheduled appointments work with child psychotherapist  related to the management of  dementia, mild intellectual disability, depression  as evidenced by review of EMR and patient or child psychotherapist report through collaboration with Medical Illustrator, provider, and care team.   Interventions: Inter-disciplinary care team collaboration (see longitudinal plan of care) Evaluation of current treatment plan related to  self management and patient's adherence to plan as established by provider  Asthma: (Status:New goal.) Long Term Goal Advised DPR. Patient;s sister to track and manage Asthma triggers Provided instruction about proper use of medications used for management of Asthma including inhalers Discussed the importance of adequate rest and management of fatigue with Asthma Assessed social determinant of health barriers   Hyperlipidemia Interventions:  (Status:  New goal.) Long Term Goal Medication review performed; medication list updated in electronic medical record.  Provider established cholesterol goals reviewed Counseled on importance of regular laboratory monitoring as prescribed Reviewed importance of limiting foods high in cholesterol Assessed social determinant of health barriers   Hypertension Interventions:  (Status:  New goal.) Long Term Goal Last practice recorded BP readings:  BP Readings from Last 3 Encounters:  04/29/23 (!) 141/87  07/12/23 122/82  06/15/23 110/70   Most recent eGFR/CrCl: No results found for: EGFR  No components found for: CRCL  Evaluation of current treatment plan related to hypertension self management and patient's/ DPR. Patient's sister  adherence to plan as established by provider Reviewed medications with patient/  DPR. Patient's sister and discussed importance of compliance Discussed plans with patient/ DPR. Patient's  sister ,  for ongoing care management follow up and provided patient / DPR. Patient's sister with direct contact information for care management team Advised patient/ DPR. Patient's sister ,  providing education and rationale, to monitor blood pressure daily and record, calling PCP for findings outside established parameters Reviewed scheduled/upcoming provider appointments  Assessed social determinant of health barriers  Dementia:  (Status:  New goal.)  Long Term Goal Evaluation of current treatment plan related to misuse of: Dementia with agitation, Dementia with mood disturbances, Dementia with other behavioral disturbances, and Dementia with psychotic disturbances Collaborated with DPR, patient's sister, Reviewed medications,  Emotional Support Provided to patient/caregiver, Sleep assessment completed, Encouraged patient/caregiver counseling/support, Referred to LCSW for assistance with Guardianship discussion, Discussed importance of discussing diagnosis with provider, Discussed importance of attendance to all provider appointments, and Advised to contact provider for new or worsening symptoms  Patient Goals/Self-Care Activities: Take all medications as prescribed Attend all scheduled provider appointments Call pharmacy for medication refills 3-7 days in advance of running out of medications Perform all self care activities independently  Perform IADL's (shopping, preparing meals, housekeeping, managing finances) independently Call provider office for new concerns or questions  Work with the social worker to address care coordination needs and will continue to work with the clinical team to address health care and disease management related needs 08/02/23:  patient's sister to f/u on colonoscopy, MRI results, and ? PT.  Follow Up Plan:  The patient/ DPR. Patient's sister,   has been provided with contact information for the care management team and has been advised to  call with any health  related questions or concerns.  The care management team will reach out to the patient/ DPR. Patient's sister,  again over the next 60 business  days.    Veva Angles RN, BSN [place] Arenas  [employer] - Managed Medicaid High Risk [phone]

## 2023-10-12 ENCOUNTER — Ambulatory Visit: Payer: Medicare (Managed Care) | Admitting: Internal Medicine

## 2023-10-21 ENCOUNTER — Other Ambulatory Visit (HOSPITAL_COMMUNITY): Payer: Self-pay

## 2023-10-25 ENCOUNTER — Other Ambulatory Visit (HOSPITAL_COMMUNITY): Payer: Self-pay

## 2023-10-25 ENCOUNTER — Other Ambulatory Visit: Payer: Self-pay | Admitting: Internal Medicine

## 2023-10-25 ENCOUNTER — Other Ambulatory Visit: Payer: Self-pay

## 2023-10-25 MED ORDER — ALBUTEROL SULFATE 4 MG PO TABS
4.0000 mg | ORAL_TABLET | Freq: Three times a day (TID) | ORAL | 0 refills | Status: DC
  Filled 2023-10-25: qty 90, 30d supply, fill #0

## 2023-10-25 MED FILL — Colchicine Tab 0.6 MG: ORAL | 90 days supply | Qty: 180 | Fill #0 | Status: AC

## 2023-10-25 NOTE — Telephone Encounter (Signed)
 Copied from CRM [id number]. Topic: Clinical - Medication Refill >> Oct 25, 2023  9:56 AM Le[place]is ORN wrote: Most Recent Primary Care Visit:  Provider: JOSHUA DEBBY CROME  Department: [facility]  Visit Type: OFFICE VISIT  Date: 07/12/2023  Medication: albuterol  (PROVENTIL ) 4 MG tablet  Has the patient contacted their pharmacy? Yes (Agent: If no, request that the patient contact the pharmacy for the refill. If patient does not wish to contact the pharmacy document the reason why and proceed with request.) (Agent: If yes, when and what did the pharmacy advise?) pharmacy said they did not received that order, patient is special needs so she can't take the inhaler form   Is this the correct pharmacy for this prescription? Yes If no, delete pharmacy and type the correct one.  This is the patient's preferred pharmacy:    [facility] [address] [place] [address] Phone: [phone] Fax: [phone]   Has the prescription been filled recently? No  Is the patient out of the medication? Yes  Has the patient been seen for an appointment in the last year OR does the patient have an upcoming appointment? Yes  Can we respond through MyChart? Yes  Agent: Please be advised that Rx refills may take up to 3 business days. We ask that you follow-up with your pharmacy.

## 2023-11-28 ENCOUNTER — Other Ambulatory Visit: Payer: Self-pay | Admitting: Obstetrics and Gynecology

## 2023-11-28 NOTE — Patient Outreach (Signed)
 Medicaid Managed Care   Nurse Care Manager Note  11/28/2023 Name:  Alyssa Powell MRN:  [id number] DOB:  09/28/1950  Alyssa Powell is an 73 y.o. year old female who is a primary patient of Alyssa Grandchild, MD.  The [facility] Managed Care Coordination team was consulted for assistance with:    Chronic healthcare management needs, dementia, intellectual disability, depression, obesity, HLD, asthma, rhintis, osteoarthritis, HTN  [name] / DPR was given information about Medicaid Managed Care Coordination team services today. [name] Designated Party Release (DPR) agreed to services and verbal consent obtained.  Engaged with patient/ DPR by telephone for follow up visit in response to provider referral for case management and/or care coordination services.   Patient is participating in a Managed Medicaid Plan:  No  Assessments/Interventions:  Review of past medical history, allergies, medications, health status, including review of consultants reports, laboratory and other test data, was performed as part of comprehensive evaluation and provision of chronic care management services.  SDOH (Social Drivers of Health) assessments and interventions performed: SDOH Interventions    Flowsheet Row Patient Outreach Telephone from 11/28/2023 in [place] [facility] POP[facility]ALTH DEPART[facility] Patient Outreach Telephone from 09/30/2023 in [place] POPULATION HEALTH DEPARTMENT Patient Outreach Telephone from 08/02/2023 in [place] POPULATION HEALTH DEPARTMENT Patient Outreach Telephone from 07/01/2023 in [place] POPULATION HEALTH DEPARTMENT Patient Outreach Telephone from 06/28/2023 in [place] POPULATION HEALTH DEPARTMENT Patient Outreach Telephone from 06/01/2023 in  POPULATION HEALTH DEPARTMENT  SDOH Interventions        Food Insecurity Interventions -- -- -- -- -- Intervention Not Indicated  Housing Interventions -- -- -- -- Intervention Not Indicated --  Transportation Interventions --  -- -- Intervention Not Indicated -- --  Utilities Interventions -- -- -- Intervention Not Indicated -- --  Alcohol Usage Interventions Intervention Not Indicated (Score <7) -- -- -- -- Intervention Not Indicated (Score <7)  Financial Strain Interventions -- -- Intervention Not Indicated -- -- --  Physical Activity Interventions -- Intervention Not Indicated -- -- -- --  Stress Interventions Intervention Not Indicated -- -- -- -- --  Social Connections Interventions -- Intervention Not Indicated -- -- -- --  Health Literacy Interventions -- -- Intervention Not Indicated  [confusion, decreased concentration , sister and brother assists] -- -- --     Care Plan Allergies  Allergen Reactions   Amoxicillin Hives   Codeine Phosphate Hives   Penicillins     Childhood allergy   Medications Reviewed Today     Reviewed by [name], RN (Registered Nurse) on 11/28/23 at [phone]  Med List Status: <None>   Medication Order Taking? Sig Documenting Provider Last Dose Status Informant  albuterol (PROVENTIL) 4 MG tablet [id number]  Take 1 tablet (4 mg total) by mouth 3 (three) times daily. Alyssa Grandchild, MD  Active   albuterol (VENTOLIN HFA) 108 (90 Base) MCG/ACT inhaler [id number]  Inhale 1-2 puffs into the lungs every 6 (six) hours as needed.   Active   budesonide (PULMICORT) 0.5 MG/2ML nebulizer solution [id number] No Take 2 mLs (0.5 mg total) by nebulization 2 (two) times daily. Alyssa Grandchild, MD Taking Active   cetirizine (ZYRTEC) 10 MG tablet [id number] No TAKE 1 TABLET BY MOUTH EVERYDAY AT BEDTIME Alyssa Skeans, MD Taking Active   colchicine 0.6 MG tablet [id number] No Take 1 tablet (0.6 mg total) by mouth 2 (two) times daily. Alyssa Grandchild, MD Not Taking Active   memantine [facility] 5 MG tablet [id number] No Take  1 tablet (5 mg total) by mouth 2 (two) times daily. Alyssa Eke, PA-C Not Taking Active   montelukast (SINGULAIR) 10 MG tablet [id number]  Take 1 tablet (10 mg total) by mouth at  bedtime. Alyssa Grandchild, MD  Active   montelukast (SINGULAIR) 10 MG tablet [id number]  Take 1 tablet (10 mg total) by mouth at bedtime. Alyssa Salisbury, MD  Active   pantoprazole (PROTONIX) 40 MG tablet [id number] No TAKE 1 TABLET BY MOUTH EVERY DAY Alyssa Skeans, MD Taking Active   rosuvastatin (CRESTOR) 10 MG tablet [id number]  Take 1 tablet (10 mg total) by mouth daily. Alyssa Grandchild, MD  Active   sertraline (ZOLOFT) 100 MG tablet [id number]  Take 1 tablet (100 mg total) by mouth daily. Alyssa Grandchild, MD  Active   Zoster Vaccine Adjuvanted [facility]) injection [id number]  Inject 0.5 mLs into the muscle once for 1 dose. 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) 03/23/2023   Need for vaccination 07/04/2022   Encounter for general adult medical examination with abnormal findings 07/04/2022   Class 1 obesity due to excess calories with serious comorbidity and body mass index (BMI) of 34.0 to 34.9 in adult 06/28/2022   Flu vaccine need 06/28/2022   Screen for colon cancer 06/28/2022   Visit for screening mammogram 06/28/2022   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/28/2022   Hyperlipidemia with target LDL less than 130 06/28/2022   Moderate persistent asthma 09/05/2018   Essential hypertension 09/05/2018   Major depressive disorder with single episode 09/05/2018   Osteoarthritis, knee 12/16/2011   Allergic rhinitis 08/07/2009   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   Conditions to be addressed/monitored per PCP order:  Chronic healthcare management needs, dementia, intellectual disability, depression, obesity, HLD, asthma, rhintis, osteoarthritis, HTN  Care Plan : RN Care Manager Plan of Care  Updates made by [name], RN since 11/28/2023 12:00 AM     Problem: Health Promotion or Disease Self-Management  (General Plan of Care)      Long-Range Goal: Chronic Disease Management and Care Coordination Needs   Start Date: 06/01/2023  Expected End Date: 02/28/2024  Priority: High  Note:   Current Barriers:  Knowledge Deficits related to plan of care for management of dementia, mild intellectual disability, depression, obesity, gout, HLD, prediabetes, HTN, asthma, rhinitis, osteoarthritis Care Coordination needs related to dementia, mild intellectual disability, depression, obesity, gout, HLD, prediabetes, HTN, asthma, rhinitis, osteoarthritis Chronic Disease Management support and education needs related to dementia, mild intellectual disability, depression, obesity, gout, HLD, prediabetes, HTN, asthma, rhinitis, osteoarthritis Literacy barriers Cognitive Deficits 11/28/23:  No issues today per patient's sister, to have MMG soon.  BP is not checked at home.  Breathing WNL.  RNCM Clinical Goal(s):  Patient / DPR will verbalize understanding of plan for management of  dementia, mild intellectual disability, depression, obesity, gout, HLD, prediabetes, HTN, asthma, rhinitis, osteoarthritis  as evidenced by DPR, sister's report verbalize basic understanding of  dementia, mild intellectual disability, depression, obesity, gout, HLD, prediabetes, HTN, asthma, rhinitis, osteoarthritis disease process and self health management plan as evidenced by DPR, sister's report take all medications exactly as prescribed and will call provider for medication related questions as evidenced by DPR, sister's report demonstrate understanding of rationale for each prescribed medication as evidenced by  DPR, sister's report attend all scheduled medical appointments as evidenced by DPR, sister's report and EMR review demonstrate ongoing adherence to prescribed treatment plan for dementia, mild intellectual disability, depression, obesity, gout, HLD, prediabetes, HTN, asthma, rhinitis, osteoarthritis   as evidenced by DPR,  sister's report and EMR review continue to work with RN Care Manager to address care management and care coordination needs related to  dementia, mild intellectual disability, depression, obesity, gout, HLD, prediabetes, HTN, asthma, rhinitis, osteoarthritis as evidenced by adherence to CM Team Scheduled appointments work with Child psychotherapist  related to the management of  dementia, mild intellectual disability, depression  as evidenced by review of EMR and patient or Child psychotherapist report through collaboration with Medical illustrator, provider, and care team.   Interventions: Inter-disciplinary care team collaboration (see longitudinal plan of care) Evaluation of current treatment plan related to  self management and patient's adherence to plan as established by provider  Asthma: (Status:New goal.) Long Term Goal Advised DPR. Patient;s sister to track and manage Asthma triggers Provided instruction about proper use of medications used for management of Asthma including inhalers Discussed the importance of adequate rest and management of fatigue with Asthma Assessed social determinant of health barriers   Hyperlipidemia Interventions:  (Status:  New goal.) Long Term Goal Medication review performed; medication list updated in electronic medical record.  Provider established cholesterol goals reviewed Counseled on importance of regular laboratory monitoring as prescribed Reviewed importance of limiting foods high in cholesterol Assessed social determinant of health barriers   Hypertension Interventions:  (Status:  New goal.) Long Term Goal Last practice recorded BP readings:  BP Readings from Last 3 Encounters:  04/29/23 (!) 141/87  07/12/23 122/82  06/15/23 110/70   Most recent eGFR/CrCl: No results found for: "EGFR"  No components found for: "CRCL"  Evaluation of current treatment plan related to hypertension self management and patient's/ DPR. Patient's sister  adherence to plan as  established by provider Reviewed medications with patient/  DPR. Patient's sister and discussed importance of compliance Discussed plans with patient/ DPR. Patient's sister ,  for ongoing care management follow up and provided patient / DPR. Patient's sister with direct contact information for care management team Advised patient/ DPR. Patient's sister ,  providing education and rationale, to monitor blood pressure daily and record, calling PCP for findings outside established parameters Reviewed scheduled/upcoming provider appointments  Assessed social determinant of health barriers  Dementia:  (Status:  New goal.)  Long Term Goal Evaluation of current treatment plan related to misuse of: Dementia with agitation, Dementia with mood disturbances, Dementia with other behavioral disturbances, and Dementia with psychotic disturbances Collaborated with DPR, patient's sister, Reviewed medications,  Emotional Support Provided to patient/caregiver, Sleep assessment completed, Encouraged patient/caregiver counseling/support, Referred to LCSW for assistance with Guardianship discussion, Discussed importance of discussing diagnosis with provider, Discussed importance of attendance to all provider appointments, and Advised to contact provider for new or worsening symptoms  Patient Goals/Self-Care Activities: Take all medications as prescribed Attend all scheduled provider appointments Call pharmacy for medication refills 3-7 days in advance of running out of medications Perform all self care activities independently  Perform IADL's (shopping, preparing meals, housekeeping, managing finances) independently Call provider office for new concerns or questions  Work with the social worker to address care coordination needs and will continue to work with the clinical team to address health care and disease management related needs  Follow Up Plan:  The patient/ DPR. Patient's sister,   has been provided with  contact information for the care management team and has been advised to call with any health related questions or concerns.  The care management team will reach out to the patient/ DPR. Patient's sister,  again over the next 60 business  days.    Long-Range Goal: Establish Plan of Care for Chronic Disease Management Needs   Priority: High  Note:   Timeframe:  Long-Range Goal Priority:  High Start Date: 06/01/23                            Expected End Date:   ongoing                    Follow Up Date 01/12/24   - schedule appointment for vaccines needed due to my age or health - schedule recommended health tests (blood work, mammogram, colonoscopy, pap test) - schedule and keep appointment for annual check-up    Why is this important?   Screening tests can find diseases early when they are easier to treat.  Your doctor or nurse will talk with you about which tests are important for you.  Getting shots for common diseases like the flu and shingles will help prevent them.   11/28/23: MMG this month.   Follow Up:  Patient agrees to Care Plan and Follow-up.  Plan: The Managed Medicaid care management team will reach out to the patient again over the next 45 business  days. and The  [name] (DPR) has been provided with contact information for the Managed Medicaid care management team and has been advised to call with any health related questions or concerns.  Date/time of next scheduled RN care management/care coordination outreach: 01/12/24 at 0900.

## 2023-11-28 NOTE — Patient Instructions (Signed)
 Visit Information  [name] / DPR was given information about Medicaid Managed Care team care coordination services and verbally consented to engagement with the [facility] Managed Care team.   Alyssa Powell / Alyssa Powell/DPR- following are the goals we discussed in your visit today:   Goals Addressed    Timeframe:  Long-Range Goal Priority:  High Start Date: 06/01/23                            Expected End Date:   ongoing                    Follow Up Date 01/12/24   - schedule appointment for vaccines needed due to my age or health - schedule recommended health tests (blood work, mammogram, colonoscopy, pap test) - schedule and keep appointment for annual check-up    Why is this important?   Screening tests can find diseases early when they are easier to treat.  Your doctor or nurse will talk with you about which tests are important for you.  Getting shots for common diseases like the flu and shingles will help prevent them.   11/28/23: MMG this month.  The patient / DPR verbalized understanding of instructions, educational materials, and care plan provided today and DECLINED offer to receive copy of patient instructions, educational materials, and care plan.   The Managed Medicaid care management team will reach out to the patient / DPR again over the next 45 business  days.  The  Alyssa Powell (DPR) has been provided with contact information for the Managed Medicaid care management team and has been advised to call with any health related questions or concerns.   Following is a copy of your plan of care:  Care Plan : RN Care Manager Plan of Care  Updates made by [name], RN since 11/28/2023 12:00 AM     Problem: Health Promotion or Disease Self-Management (General Plan of Care)      Long-Range Goal: Chronic Disease Management and Care Coordination Needs   Start Date: 06/01/2023  Expected End Date: 02/28/2024  Priority: High  Note:   Current Barriers:  Knowledge  Deficits related to plan of care for management of dementia, mild intellectual disability, depression, obesity, gout, HLD, prediabetes, HTN, asthma, rhinitis, osteoarthritis Care Coordination needs related to dementia, mild intellectual disability, depression, obesity, gout, HLD, prediabetes, HTN, asthma, rhinitis, osteoarthritis Chronic Disease Management support and education needs related to dementia, mild intellectual disability, depression, obesity, gout, HLD, prediabetes, HTN, asthma, rhinitis, osteoarthritis Literacy barriers Cognitive Deficits 11/28/23:  No issues today per patient's sister, to have MMG soon.  BP is not checked at home.  Breathing WNL.  RNCM Clinical Goal(s):  Patient / DPR will verbalize understanding of plan for management of  dementia, mild intellectual disability, depression, obesity, gout, HLD, prediabetes, HTN, asthma, rhinitis, osteoarthritis  as evidenced by DPR, sister's report verbalize basic understanding of  dementia, mild intellectual disability, depression, obesity, gout, HLD, prediabetes, HTN, asthma, rhinitis, osteoarthritis disease process and self health management plan as evidenced by DPR, sister's report take all medications exactly as prescribed and will call provider for medication related questions as evidenced by DPR, sister's report demonstrate understanding of rationale for each prescribed medication as evidenced by DPR, sister's report attend all scheduled medical appointments as evidenced by DPR, sister's report and EMR review demonstrate ongoing adherence to prescribed treatment plan for dementia, mild intellectual disability, depression, obesity, gout, HLD,  prediabetes, HTN, asthma, rhinitis, osteoarthritis   as evidenced by DPR, sister's report and EMR review continue to work with RN Care Manager to address care management and care coordination needs related to  dementia, mild intellectual disability, depression, obesity, gout, HLD, prediabetes,  HTN, asthma, rhinitis, osteoarthritis as evidenced by adherence to CM Team Scheduled appointments work with Child psychotherapist  related to the management of  dementia, mild intellectual disability, depression  as evidenced by review of EMR and patient or Child psychotherapist report through collaboration with Medical illustrator, provider, and care team.   Interventions: Inter-disciplinary care team collaboration (see longitudinal plan of care) Evaluation of current treatment plan related to  self management and patient's adherence to plan as established by provider  Asthma: (Status:New goal.) Long Term Goal Advised DPR. Patient;s sister to track and manage Asthma triggers Provided instruction about proper use of medications used for management of Asthma including inhalers Discussed the importance of adequate rest and management of fatigue with Asthma Assessed social determinant of health barriers   Hyperlipidemia Interventions:  (Status:  New goal.) Long Term Goal Medication review performed; medication list updated in electronic medical record.  Provider established cholesterol goals reviewed Counseled on importance of regular laboratory monitoring as prescribed Reviewed importance of limiting foods high in cholesterol Assessed social determinant of health barriers   Hypertension Interventions:  (Status:  New goal.) Long Term Goal Last practice recorded BP readings:  BP Readings from Last 3 Encounters:  04/29/23 (!) 141/87  07/12/23 122/82  06/15/23 110/70   Most recent eGFR/CrCl: No results found for: "EGFR"  No components found for: "CRCL"  Evaluation of current treatment plan related to hypertension self management and patient's/ DPR. Patient's sister  adherence to plan as established by provider Reviewed medications with patient/  DPR. Patient's sister and discussed importance of compliance Discussed plans with patient/ DPR. Patient's sister ,  for ongoing care management follow up and provided  patient / DPR. Patient's sister with direct contact information for care management team Advised patient/ DPR. Patient's sister ,  providing education and rationale, to monitor blood pressure daily and record, calling PCP for findings outside established parameters Reviewed scheduled/upcoming provider appointments  Assessed social determinant of health barriers  Dementia:  (Status:  New goal.)  Long Term Goal Evaluation of current treatment plan related to misuse of: Dementia with agitation, Dementia with mood disturbances, Dementia with other behavioral disturbances, and Dementia with psychotic disturbances Collaborated with DPR, patient's sister, Reviewed medications,  Emotional Support Provided to patient/caregiver, Sleep assessment completed, Encouraged patient/caregiver counseling/support, Referred to LCSW for assistance with Guardianship discussion, Discussed importance of discussing diagnosis with provider, Discussed importance of attendance to all provider appointments, and Advised to contact provider for new or worsening symptoms  Patient Goals/Self-Care Activities: Take all medications as prescribed Attend all scheduled provider appointments Call pharmacy for medication refills 3-7 days in advance of running out of medications Perform all self care activities independently  Perform IADL's (shopping, preparing meals, housekeeping, managing finances) independently Call provider office for new concerns or questions  Work with the social worker to address care coordination needs and will continue to work with the clinical team to address health care and disease management related needs  Follow Up Plan:  The patient/ DPR. Patient's sister,   has been provided with contact information for the care management team and has been advised to call with any health related questions or concerns.  The care management team will reach out to the patient/ DPR. Patient's  sister,  again over the next 60  business  days.    Kathi Der RNC, BSN, [employer] [facility] RN Care Manager Direct Dial [phone] Website: [email or web address]

## 2023-12-08 ENCOUNTER — Other Ambulatory Visit (HOSPITAL_COMMUNITY): Payer: Self-pay

## 2023-12-14 ENCOUNTER — Ambulatory Visit: Payer: Medicare (Managed Care)

## 2024-01-05 ENCOUNTER — Ambulatory Visit: Payer: Medicare (Managed Care)

## 2024-01-16 ENCOUNTER — Other Ambulatory Visit (HOSPITAL_COMMUNITY): Payer: Self-pay

## 2024-01-16 ENCOUNTER — Other Ambulatory Visit: Payer: Self-pay | Admitting: Internal Medicine

## 2024-01-16 DIAGNOSIS — M1A09X0 Idiopathic chronic gout, multiple sites, without tophus (tophi): Secondary | ICD-10-CM

## 2024-01-16 DIAGNOSIS — F324 Major depressive disorder, single episode, in partial remission: Secondary | ICD-10-CM

## 2024-01-17 ENCOUNTER — Other Ambulatory Visit (HOSPITAL_COMMUNITY): Payer: Self-pay

## 2024-01-17 ENCOUNTER — Ambulatory Visit
Admission: RE | Admit: 2024-01-17 | Discharge: 2024-01-17 | Disposition: A | Discharge status: Home / Self Care | Source: Ambulatory Visit | Attending: Internal Medicine | Admitting: Internal Medicine

## 2024-01-17 ENCOUNTER — Other Ambulatory Visit: Payer: Self-pay | Admitting: Internal Medicine

## 2024-01-17 DIAGNOSIS — Z1231 Encounter for screening mammogram for malignant neoplasm of breast: Secondary | ICD-10-CM

## 2024-01-18 ENCOUNTER — Telehealth: Payer: Self-pay | Admitting: *Deleted

## 2024-01-18 NOTE — Progress Notes (Signed)
 Complex Care Management Care Guide Note  01/18/2024 Name: Alyssa Powell MRN: [id number] DOB: 28-Oct-1950  Alyssa Powell is a 73 y.o. year old female who is a primary care patient of [name], MD and is actively engaged with the care management team. I reached out to Barton Bos by phone today to assist with scheduling  with the RN Case Manager.  Follow up plan: Telephone appointment with complex care management team member scheduled for:  01/30/2024  Kandis Ormond, CMA [place]  [facility], [facility] Guide Direct Dial: [phone]  Fax: [phone] Website: [email or web address]

## 2024-01-18 NOTE — Progress Notes (Signed)
 Complex Care Management Care Guide Note  01/18/2024 Name: Alyssa Powell MRN: [id number] DOB: [birth year]  [name] is a 73 y.o. year old female who is a primary care patient of [name], MD and is actively engaged with the care management team. I reached out to Barton Bos by phone today to assist with scheduling  with the RN Case Manager.  Follow up plan: Unsuccessful telephone outreach attempt made. A HIPAA compliant phone message was left for the patient providing contact information and requesting a return call.  Kandis Ormond, CMA [place]  [facility], [facility] Guide Direct Dial: [phone]  Fax: [phone] Website: [email or web address]

## 2024-01-20 ENCOUNTER — Other Ambulatory Visit (HOSPITAL_COMMUNITY): Payer: Self-pay

## 2024-01-26 ENCOUNTER — Other Ambulatory Visit (HOSPITAL_COMMUNITY): Payer: Self-pay

## 2024-01-26 ENCOUNTER — Other Ambulatory Visit: Payer: Self-pay | Admitting: Internal Medicine

## 2024-01-26 DIAGNOSIS — M1A09X0 Idiopathic chronic gout, multiple sites, without tophus (tophi): Secondary | ICD-10-CM

## 2024-01-26 DIAGNOSIS — F324 Major depressive disorder, single episode, in partial remission: Secondary | ICD-10-CM

## 2024-01-30 ENCOUNTER — Other Ambulatory Visit: Payer: Self-pay

## 2024-01-30 NOTE — Patient Outreach (Signed)
 Complex Care Management   Visit Note  01/30/2024  Name:  Alyssa Powell MRN: [id number] DOB: Feb 10, 1951  Situation: RNCM called to complete assessment. Spoke with sister Kary Pages who reports this is not a good time to talk as she managing health needs of a family friend.  [name] states that patient is doing well. She reports patient has had a mammogram and that patient's asthma is well managed at this time. She denies that patient has any needs at this time.  Follow Up Plan:   Telephone follow up appointment date/time:  02/15/24 at 2:00 pm per sister request  [name], RN, MSN, BSN, CCM [place]  [facility], Population Health Case Manager Phone: [phone]

## 2024-01-31 ENCOUNTER — Other Ambulatory Visit: Payer: Self-pay

## 2024-01-31 ENCOUNTER — Other Ambulatory Visit (HOSPITAL_COMMUNITY): Payer: Self-pay

## 2024-01-31 MED ORDER — SERTRALINE HCL 100 MG PO TABS
100.0000 mg | ORAL_TABLET | Freq: Every day | ORAL | 0 refills | Status: DC
  Filled 2024-01-31: qty 90, 90d supply, fill #0

## 2024-01-31 MED ORDER — COLCHICINE 0.6 MG PO TABS
0.6000 mg | ORAL_TABLET | Freq: Two times a day (BID) | ORAL | 0 refills | Status: DC
  Filled 2024-01-31: qty 180, 90d supply, fill #0

## 2024-01-31 MED FILL — Albuterol Sulfate Inhal Aero 108 MCG/ACT (90MCG Base Equiv): RESPIRATORY_TRACT | 25 days supply | Qty: 18 | Fill #0 | Status: AC

## 2024-02-01 ENCOUNTER — Other Ambulatory Visit (HOSPITAL_COMMUNITY): Payer: Self-pay

## 2024-02-02 ENCOUNTER — Other Ambulatory Visit: Payer: Self-pay | Admitting: Internal Medicine

## 2024-02-02 ENCOUNTER — Other Ambulatory Visit (HOSPITAL_COMMUNITY): Payer: Self-pay

## 2024-02-06 ENCOUNTER — Other Ambulatory Visit (HOSPITAL_COMMUNITY): Payer: Self-pay

## 2024-02-15 ENCOUNTER — Telehealth

## 2024-02-15 NOTE — Patient Outreach (Signed)
 Complex Care Management   Visit Note  02/15/2024  Name:  Alyssa Powell MRN: [id number] DOB: [birth year]  Situation: RNCM called to complete assessment. Spoke with patient's sister, Alyssa Powell). [name] states this is good time to talk-Request [facility] call tomorrow morning.  Follow up plan: Patient rescheduled for tomorrow.  Alyssa Revering, RN, MSN, BSN, CCM [place]  [facility], Population Health Case Manager Phone: [phone]

## 2024-02-16 ENCOUNTER — Other Ambulatory Visit: Payer: Self-pay

## 2024-02-16 ENCOUNTER — Telehealth: Payer: Self-pay | Admitting: Internal Medicine

## 2024-02-16 ENCOUNTER — Other Ambulatory Visit (HOSPITAL_COMMUNITY): Payer: Self-pay

## 2024-02-16 NOTE — Patient Instructions (Signed)
 Visit Information  Thank you for taking time to visit with me today. Please don't hesitate to contact me if I can be of assistance to you before our next scheduled appointment.  Our next appointment is by telephone on 03/19/24 at 10:00 am Please call the care guide team at [phone] if you need to cancel or reschedule your appointment.   Following is a copy of your care plan:   Goals Addressed             This Visit's Progress    VBCI RN Care Plan       Problems:  Chronic Disease Management support and education needs related to Dementia and HTN Cognitive Deficits Missed Appointments  Goal: Over the next 90 days the Patient will continue to work with RN Care Manager and/or Social Worker to address care management and care coordination needs related to Dementia and HTN as evidenced by adherence to care management team scheduled appointments     take all medications exactly as prescribed and will call provider for medication related questions as evidenced by caregiver report or review of chart    verbalize understanding of plan for management of Dementia and HTN as evidenced by patient/caregiver report or review of chart Patient/caregiver will call to reschedule missed appointments(missed Primary care appointment and neurology appointment)  Interventions:  Hypertension Interventions: Last practice recorded BP readings:  BP Readings from Last 3 Encounters:  07/12/23 122/82  06/15/23 110/70  04/29/23 (!) 141/87   Most recent eGFR/CrCl: No results found for: "EGFR"  No components found for: "CRCL"  Evaluation of current treatment plan related to hypertension self management and patient's adherence to plan as established by provider Reviewed medications with patient and discussed importance of compliance Discussed plans with patient for ongoing care management follow up and provided patient with direct contact information for care management team Discussed the importance of  following up with provider.Advised sister to contact provider to schedule a follow up appointment  Dementia Interventions: Evaluation of current treatment plan related to Dementia Assess for Patient and Caregiver support needs Advised to reschedule missed appointment with Neurologist  Patient Self-Care Activities:  Call pharmacy for medication refills 3-7 days in advance of running out of medications Call provider office for new concerns or questions  Take medications as prescribed   call doctor for signs and symptoms of high blood pressure keep all doctor appointments Begin to check and record BP and notify provider with any readings outside recommended range. Caregiver to reschedule missed appointments with Neurologist and Primary care provider  Plan:  Telephone follow up appointment with care management team member scheduled for:  03/20/23 at 10:00 am           Please call the Suicide and Crisis Lifeline: 988 call the [place]  National Suicide Prevention Lifeline: 873-049-2878 or TTY: 830-340-5138 TTY 802-159-0971) to talk to a trained counselor if you are experiencing a Mental Health or Behavioral Health Crisis or need someone to talk to.  The patient verbalized understanding of instructions, educational materials, and care plan provided today and DECLINED offer to receive copy of patient instructions, educational materials, and care plan.   [name], RN, MSN, BSN, CCM [place]  [facility], Population Health Case Manager Phone: [phone]

## 2024-02-16 NOTE — Patient Outreach (Signed)
 Complex Care Management   Visit Note  02/16/2024  Name:  Alyssa Powell MRN: [id number] DOB: 05/24/1951  Situation: Referral received for Complex Care Management related to HTN I obtained verbal consent from Alyssa Powell/caregiver/sister.  Visit completed with Alyssa Powell  on the phone  Background:   Past Medical History:  Diagnosis Date   Allergy    Asthma    Hypertension    Mental retardation    Assessment: spoke with patient's sister Alyssa Powell who is managing patient's health care now. Patient lives with her mother who is in her 28's. Per [name] patient is doing well. Patient has an in home care assistant who assist with personal care needs. Sister reports patient's asthma is controlled at this time. Per review of chart, HLD within normal limits(last done 03/23/23). Patient has had missed appointment with neurology(last seen 04/29/23) and primary care provider (last seen 07/12/23). Ms. [name] states she will call to reschedule. Patient Reported Symptoms:  Cognitive Cognitive Status: Requires Assistance Decision Making Cognitive/Intellectual Conditions Management [RPT]: Intellectual Disability      Neurological Neurological Review of Symptoms: No symptoms reported Neurological Conditions: Dementia Neurological Management Strategies: Medication therapy Neurological Self-Management Outcome: 4 (good)  HEENT HEENT Symptoms Reported: No symptoms reported      Cardiovascular Cardiovascular Symptoms Reported: No symptoms reported Does patient have uncontrolled Hypertension?: Yes Is patient checking Blood Pressure at home?: No Patient's Recent BP reading at home: has BP monitor sister to request assistant to begin to check Cardiovascular Conditions: Hypertension Cardiovascular Management Strategies: Routine screening, Medication therapy Cardiovascular Self-Management Outcome: 5 (very good)  Respiratory Respiratory Symptoms Reported: No symptoms reported Respiratory Conditions:  Asthma Respiratory Self-Management Outcome: 4 (good)  Endocrine Patient reports the following symptoms related to hypoglycemia or hyperglycemia : No symptoms reported Is patient diabetic?: No    Gastrointestinal Gastrointestinal Symptoms Reported: No symptoms reported      Genitourinary Genitourinary Symptoms Reported: No symptoms reported    Integumentary Integumentary Symptoms Reported: No symptoms reported    Musculoskeletal Musculoskelatal Symptoms Reviewed: Difficulty walking Additional Musculoskeletal Details: both knees Musculoskeletal Conditions: Osteoarthritis Musculoskeletal Management Strategies: Medication therapy, Adequate rest, Routine screening Musculoskeletal Self-Management Outcome: 4 (good) Falls in the past year?: No    Psychosocial Psychosocial Symptoms Reported: No symptoms reported Behavioral Health Conditions: Dementia          06/15/2023    3:03 PM  Depression screen PHQ 2/9  Decreased Interest 0  Down, Depressed, Hopeless 0  PHQ - 2 Score 0  Altered sleeping 0  Tired, decreased energy 0  Change in appetite 0  Feeling bad or failure about yourself  0  Trouble concentrating 0  Moving slowly or fidgety/restless 0  Suicidal thoughts 0  PHQ-9 Score 0  Difficult doing work/chores Not difficult at all    There were no vitals filed for this visit.  Medications Reviewed Today     Reviewed by [name] M, RN (Registered Nurse) on 02/16/24 at [phone]  Med List Status: <None>   Medication Order Taking? Sig Documenting Provider Last Dose Status Informant  albuterol  (PROVENTIL ) 4 MG tablet [id number] Yes Take 1 tablet (4 mg total) by mouth 3 (three) times daily. [name], MD Taking Active            Med Note Lajuana Pilar, Jacky Massing   Thu Feb 16, 2024  9:38 AM) Sister reports patient takes once a day  albuterol  (VENTOLIN  HFA) 108 (90 Base) MCG/ACT inhaler [id number] Yes Inhale 1 to 2 puff(s) into the  lungs every 6 (six) hours as needed. [name],  MD Taking Active   budesonide  (PULMICORT ) 0.5 MG/2ML nebulizer solution [id number] Yes Take 2 mLs (0.5 mg total) by nebulization 2 (two) times daily. [name], MD Taking Active   cetirizine  (ZYRTEC ) 10 MG tablet [id number] Yes TAKE 1 TABLET BY MOUTH EVERYDAY AT BEDTIME [name], MD Taking Active   colchicine  0.6 MG tablet [id number] Yes Take 1 tablet (0.6 mg total) by mouth 2 (two) times daily. [name], MD Taking Active   memantine  (NAMENDA ) 5 MG tablet [id number] Yes Take 1 tablet (5 mg total) by mouth 2 (two) times daily. Wertman, Sara E, PA-C Taking Active   montelukast  (SINGULAIR ) 10 MG tablet [id number] Yes Take 1 tablet (10 mg total) by mouth at bedtime. [name], MD Taking Active   montelukast  (SINGULAIR ) 10 MG tablet [id number]  Take 1 tablet (10 mg total) by mouth at bedtime. [name], MD  Active   pantoprazole  (PROTONIX ) 40 MG tablet [id number] Yes TAKE 1 TABLET BY MOUTH EVERY DAY [name], MD Taking Active   rosuvastatin  (CRESTOR ) 10 MG tablet [id number] Yes Take 1 tablet (10 mg total) by mouth daily. [name], MD Taking Active   sertraline  (ZOLOFT ) 100 MG tablet [id number] Yes Take 1 tablet (100 mg total) by mouth daily. [name], MD Taking Active   Zoster Vaccine Adjuvanted (SHINGRIX ) injection [id number]  Inject 0.5 mLs into the muscle once for 1 dose. [name], MD  Expired 07/12/23 2359           Recommendation:   PCP Follow-up-caregiver to schedule Follow up with neurologist-caregiver to schedule  Follow Up Plan:   Telephone follow up appointment date/time:  03/19/24 at 10:00 am  [name], RN, MSN, BSN, CCM   [facility], Population Health Case Manager Phone: [phone]

## 2024-02-16 NOTE — Telephone Encounter (Signed)
 Copied from CRM [id number]. Topic: Clinical - Prescription Issue >> Feb 16, 2024 11:41 AM Chuck Crater wrote: Reason for CRM: Patient sister wants to know if pcp can send in enough of albuterol  (PROVENTIL ) 4 MG tablet until her appointment on 06/23.  She is completely out and needs medication.

## 2024-02-17 ENCOUNTER — Other Ambulatory Visit: Payer: Self-pay

## 2024-02-17 ENCOUNTER — Other Ambulatory Visit (HOSPITAL_COMMUNITY): Payer: Self-pay

## 2024-02-17 MED ORDER — ALBUTEROL SULFATE 4 MG PO TABS
4.0000 mg | ORAL_TABLET | Freq: Three times a day (TID) | ORAL | 0 refills | Status: DC
  Filled 2024-02-17: qty 90, 30d supply, fill #0

## 2024-02-17 MED ORDER — ALBUTEROL SULFATE 4 MG PO TABS: 4.0000 mg | ORAL_TABLET | Freq: Three times a day (TID) | ORAL | 0 refills | Status: DC

## 2024-02-17 NOTE — Telephone Encounter (Signed)
 Medication has been sent in. Patients sister has been made aware.

## 2024-02-20 ENCOUNTER — Other Ambulatory Visit (HOSPITAL_COMMUNITY): Payer: Self-pay

## 2024-03-10 ENCOUNTER — Other Ambulatory Visit (HOSPITAL_COMMUNITY): Payer: Self-pay

## 2024-03-12 ENCOUNTER — Ambulatory Visit (INDEPENDENT_AMBULATORY_CARE_PROVIDER_SITE_OTHER): Admitting: Internal Medicine

## 2024-03-12 ENCOUNTER — Other Ambulatory Visit: Payer: Self-pay

## 2024-03-12 ENCOUNTER — Ambulatory Visit: Payer: Self-pay | Admitting: Internal Medicine

## 2024-03-12 ENCOUNTER — Encounter: Payer: Self-pay | Admitting: Internal Medicine

## 2024-03-12 ENCOUNTER — Ambulatory Visit (INDEPENDENT_AMBULATORY_CARE_PROVIDER_SITE_OTHER)

## 2024-03-12 ENCOUNTER — Other Ambulatory Visit (HOSPITAL_COMMUNITY): Payer: Self-pay

## 2024-03-12 VITALS — BP 146/78 | HR 73 | Temp 97.9°F | Resp 20 | Ht 67.0 in | Wt 206.2 lb

## 2024-03-12 DIAGNOSIS — R059 Cough, unspecified: Secondary | ICD-10-CM | POA: Diagnosis not present

## 2024-03-12 DIAGNOSIS — K219 Gastro-esophageal reflux disease without esophagitis: Secondary | ICD-10-CM | POA: Diagnosis not present

## 2024-03-12 DIAGNOSIS — R052 Subacute cough: Secondary | ICD-10-CM | POA: Diagnosis not present

## 2024-03-12 DIAGNOSIS — E785 Hyperlipidemia, unspecified: Secondary | ICD-10-CM

## 2024-03-12 DIAGNOSIS — I10 Essential (primary) hypertension: Secondary | ICD-10-CM | POA: Diagnosis not present

## 2024-03-12 DIAGNOSIS — J4540 Moderate persistent asthma, uncomplicated: Secondary | ICD-10-CM | POA: Diagnosis not present

## 2024-03-12 DIAGNOSIS — R079 Chest pain, unspecified: Secondary | ICD-10-CM

## 2024-03-12 DIAGNOSIS — R072 Precordial pain: Secondary | ICD-10-CM | POA: Diagnosis not present

## 2024-03-12 DIAGNOSIS — F324 Major depressive disorder, single episode, in partial remission: Secondary | ICD-10-CM | POA: Diagnosis not present

## 2024-03-12 DIAGNOSIS — J4541 Moderate persistent asthma with (acute) exacerbation: Secondary | ICD-10-CM

## 2024-03-12 LAB — CBC WITH DIFFERENTIAL/PLATELET
Basophils Absolute: 0 10*3/uL (ref 0.0–0.1)
Basophils Relative: 0.6 % (ref 0.0–3.0)
Eosinophils Absolute: 0.2 10*3/uL (ref 0.0–0.7)
Eosinophils Relative: 4.2 % (ref 0.0–5.0)
HCT: 44.9 % (ref 36.0–46.0)
Hemoglobin: 14.9 g/dL (ref 12.0–15.0)
Lymphocytes Relative: 31.6 % (ref 12.0–46.0)
Lymphs Abs: 1.5 10*3/uL (ref 0.7–4.0)
MCHC: 33.2 g/dL (ref 30.0–36.0)
MCV: 85.5 fl (ref 78.0–100.0)
Monocytes Absolute: 0.5 10*3/uL (ref 0.1–1.0)
Monocytes Relative: 10.2 % (ref 3.0–12.0)
Neutro Abs: 2.4 10*3/uL (ref 1.4–7.7)
Neutrophils Relative %: 53.4 % (ref 43.0–77.0)
Platelets: 252 10*3/uL (ref 150.0–400.0)
RBC: 5.26 Mil/uL — ABNORMAL HIGH (ref 3.87–5.11)
RDW: 13.2 % (ref 11.5–15.5)
WBC: 4.6 10*3/uL (ref 4.0–10.5)

## 2024-03-12 LAB — BASIC METABOLIC PANEL WITH GFR
BUN: 16 mg/dL (ref 6–23)
CO2: 29 meq/L (ref 19–32)
Calcium: 9.8 mg/dL (ref 8.4–10.5)
Chloride: 104 meq/L (ref 96–112)
Creatinine, Ser: 0.91 mg/dL (ref 0.40–1.20)
GFR: 62.1 mL/min (ref >60.00)
Glucose, Bld: 89 mg/dL (ref 70–99)
Potassium: 3.9 meq/L (ref 3.5–5.1)
Sodium: 143 meq/L (ref 135–145)

## 2024-03-12 LAB — HEPATIC FUNCTION PANEL
ALT: 14 U/L (ref 0–35)
AST: 17 U/L (ref 0–37)
Albumin: 4.5 g/dL (ref 3.5–5.2)
Alkaline Phosphatase: 53 U/L (ref 39–117)
Bilirubin, Direct: 0.1 mg/dL (ref 0.0–0.3)
Total Bilirubin: 0.4 mg/dL (ref 0.2–1.2)
Total Protein: 8 g/dL (ref 6.0–8.3)

## 2024-03-12 LAB — TROPONIN I (HIGH SENSITIVITY): High Sens Troponin I: 8 ng/L (ref 2–17)

## 2024-03-12 LAB — D-DIMER, QUANTITATIVE: D-Dimer, Quant: 0.48 ug{FEU}/mL (ref <0.50)

## 2024-03-12 LAB — BRAIN NATRIURETIC PEPTIDE: Pro B Natriuretic peptide (BNP): 41 pg/mL (ref 0.0–100.0)

## 2024-03-12 LAB — TSH: TSH: 1.06 u[IU]/mL (ref 0.35–5.50)

## 2024-03-12 MED ORDER — METHYLPREDNISOLONE ACETATE 40 MG/ML IJ SUSP
40.0000 mg | Freq: Once | INTRAMUSCULAR | Status: AC
  Administered 2024-03-12: 40 mg via INTRAMUSCULAR

## 2024-03-12 MED ORDER — BUDESONIDE 0.5 MG/2ML IN SUSP: 0.5000 mg | Freq: Two times a day (BID) | RESPIRATORY_TRACT | 1 refills | Status: DC

## 2024-03-12 MED ORDER — ROSUVASTATIN CALCIUM 10 MG PO TABS
10.0000 mg | ORAL_TABLET | Freq: Every day | ORAL | 1 refills | Status: DC
  Filled 2024-03-12 – 2024-04-16 (×2): qty 90, 90d supply, fill #0

## 2024-03-12 MED ORDER — AIRSUPRA 90-80 MCG/ACT IN AERO
2.0000 | INHALATION_SPRAY | Freq: Four times a day (QID) | RESPIRATORY_TRACT | 1 refills | Status: DC | PRN
Start: 2024-03-12 — End: 2024-06-18
  Filled 2024-03-12: qty 32.1, 90d supply, fill #0
  Filled 2024-06-15: qty 32.1, 90d supply, fill #1

## 2024-03-12 MED ORDER — ALBUTEROL SULFATE (2.5 MG/3ML) 0.083% IN NEBU: 2.5000 mg | INHALATION_SOLUTION | Freq: Four times a day (QID) | RESPIRATORY_TRACT | 3 refills | Status: DC | PRN

## 2024-03-12 MED ORDER — SERTRALINE HCL 100 MG PO TABS
100.0000 mg | ORAL_TABLET | Freq: Every day | ORAL | 0 refills | Status: DC
  Filled 2024-03-12 – 2024-04-23 (×2): qty 90, 90d supply, fill #0

## 2024-03-12 MED ORDER — PANTOPRAZOLE SODIUM 40 MG PO TBEC
40.0000 mg | DELAYED_RELEASE_TABLET | Freq: Every day | ORAL | 1 refills | Status: DC
Start: 2024-03-12 — End: 2024-06-18
  Filled 2024-03-12: qty 90, 90d supply, fill #0

## 2024-03-12 NOTE — Patient Instructions (Signed)

## 2024-03-12 NOTE — Progress Notes (Unsigned)
 Subjective:  Patient ID: Alyssa Powell, female    DOB: 1951-08-29  Age: 73 y.o. MRN: [id number]  CC: Hypertension, Cough, and Asthma   HPI [name] presents for f/up -----  Discussed the use of AI scribe software for clinical note transcription with the patient, who gave verbal consent to proceed.  History of Present Illness   Alyssa Powell is a 73 year old female who presents with knee pain and wheezing.  She experiences knee pain, which she attributes to arthritis, with no recent joint injuries. She has not been taking any specific medication for pain, but her mother occasionally gives her Tylenol.  She has been experiencing wheezing, which started with changes in the weather, particularly with rain and humidity. She coughs up white phlegm but has no fever, chills, or night sweats. She also experiences mild chest pain and occasional throat soreness.       Outpatient Medications Prior to Visit  Medication Sig Dispense Refill   cetirizine  (ZYRTEC ) 10 MG tablet TAKE 1 TABLET BY MOUTH EVERYDAY AT BEDTIME 90 tablet 1   colchicine  0.6 MG tablet Take 1 tablet (0.6 mg total) by mouth 2 (two) times daily. 180 tablet 0   memantine  (NAMENDA ) 5 MG tablet Take 1 tablet (5 mg total) by mouth 2 (two) times daily. 60 tablet 11   montelukast  (SINGULAIR ) 10 MG tablet Take 1 tablet (10 mg total) by mouth at bedtime. 90 tablet 1   Zoster Vaccine Adjuvanted (SHINGRIX ) injection Inject 0.5 mLs into the muscle once for 1 dose. 0.5 mL 1   albuterol  (PROVENTIL ) 4 MG tablet Take 1 tablet (4 mg total) by mouth 3 (three) times daily. 90 tablet 0   albuterol  (VENTOLIN  HFA) 108 (90 Base) MCG/ACT inhaler Inhale 1 to 2 puff(s) into the lungs every 6 (six) hours as needed. 18 g 2   budesonide  (PULMICORT ) 0.5 MG/2ML nebulizer solution Take 2 mLs (0.5 mg total) by nebulization 2 (two) times daily. 360 mL 1   montelukast  (SINGULAIR ) 10 MG tablet Take 1 tablet (10 mg total) by mouth at bedtime. 90 tablet 1    pantoprazole  (PROTONIX ) 40 MG tablet TAKE 1 TABLET BY MOUTH EVERY DAY 90 tablet 1   rosuvastatin  (CRESTOR ) 10 MG tablet Take 1 tablet (10 mg total) by mouth daily. 90 tablet 1   sertraline  (ZOLOFT ) 100 MG tablet Take 1 tablet (100 mg total) by mouth daily. 90 tablet 0   No facility-administered medications prior to visit.    ROS Review of Systems  Constitutional:  Negative for appetite change, chills, diaphoresis, fatigue and fever.  HENT: Negative.  Negative for sore throat and trouble swallowing.   Respiratory:  Positive for cough, shortness of breath and wheezing. Negative for chest tightness.   Cardiovascular:  Positive for chest pain. Negative for palpitations and leg swelling.  Gastrointestinal: Negative.  Negative for abdominal pain, constipation, diarrhea, nausea and vomiting.  Genitourinary: Negative.  Negative for difficulty urinating.  Musculoskeletal:  Positive for arthralgias and joint swelling. Negative for myalgias.  Skin: Negative.  Negative for color change.  Neurological:  Negative for dizziness and weakness.  Hematological:  Negative for adenopathy. Does not bruise/bleed easily.  Psychiatric/Behavioral:  Positive for confusion and decreased concentration. The patient is not nervous/anxious.     Objective:  BP (!) 146/78 (BP Location: Right Arm, Patient Position: Sitting, Cuff Size: Large) Comment: BP (L) 148/82  Pulse 73   Temp 97.9 F (36.6 C) (Oral)   Resp 20   Ht 5' 7 (1.702  m)   Wt 206 lb 4 oz (93.6 kg)   SpO2 97%   BMI 32.30 kg/m   BP Readings from Last 3 Encounters:  03/12/24 (!) 146/78  07/12/23 122/82  06/15/23 110/70    Wt Readings from Last 3 Encounters:  03/12/24 206 lb 4 oz (93.6 kg)  07/12/23 202 lb 3.2 oz (91.7 kg)  06/15/23 201 lb (91.2 kg)    Physical Exam Vitals reviewed.  Constitutional:      Appearance: Normal appearance.  HENT:     Mouth/Throat:     Mouth: Mucous membranes are moist.   Eyes:     General: No scleral  icterus.    Conjunctiva/sclera: Conjunctivae normal.    Cardiovascular:     Rate and Rhythm: Normal rate and regular rhythm.     Heart sounds: Normal heart sounds, S1 normal and S2 normal. No murmur heard.    No friction rub. No gallop.     Comments: EKG---- NSR, 75 bpm No LVH, Q waves, or ST/T wave changes  Unchanged  Pulmonary:     Effort: Pulmonary effort is normal. No respiratory distress.     Breath sounds: No stridor. Examination of the right-upper field reveals wheezing. Examination of the left-upper field reveals wheezing. Examination of the right-middle field reveals wheezing. Examination of the left-middle field reveals wheezing. Examination of the right-lower field reveals wheezing. Examination of the left-lower field reveals wheezing. Wheezing present. No decreased breath sounds, rhonchi or rales.  Chest:     Chest wall: No tenderness.  Abdominal:     General: Abdomen is flat.     Palpations: There is no mass.     Tenderness: There is no abdominal tenderness. There is no guarding.     Hernia: No hernia is present.   Musculoskeletal:        General: Swelling and deformity present. No tenderness. Normal range of motion.     Cervical back: Neck supple.     Right lower leg: No edema.     Left lower leg: No edema.  Lymphadenopathy:     Cervical: No cervical adenopathy.   Skin:    General: Skin is warm and dry.   Neurological:     General: No focal deficit present.     Mental Status: She is alert. Mental status is at baseline.   Psychiatric:        Mood and Affect: Mood normal.        Behavior: Behavior normal.     Lab Results  Component Value Date   WBC 5.1 03/23/2023   HGB 14.2 03/23/2023   HCT 43.5 03/23/2023   PLT 303.0 03/23/2023   GLUCOSE 93 03/23/2023   CHOL 169 03/23/2023   TRIG 68.0 03/23/2023   HDL 57.50 03/23/2023   LDLCALC 98 03/23/2023   ALT 23 03/23/2023   AST 23 03/23/2023   NA 141 03/23/2023   K 3.8 03/23/2023   CL 101 03/23/2023    CREATININE 0.90 03/23/2023   BUN 12 03/23/2023   CO2 32 03/23/2023   TSH 1.01 04/29/2023   HGBA1C 5.7 03/23/2023    MM 3D SCREENING MAMMOGRAM BILATERAL BREAST Result Date: 01/19/2024 CLINICAL DATA:  Screening. EXAM: DIGITAL SCREENING BILATERAL MAMMOGRAM WITH TOMOSYNTHESIS AND CAD TECHNIQUE: Bilateral screening digital craniocaudal and mediolateral oblique mammograms were obtained. Bilateral screening digital breast tomosynthesis was performed. The images were evaluated with computer-aided detection. COMPARISON:  None available. ACR Breast Density Category a: The breasts are almost entirely fatty. FINDINGS: There are no findings  suspicious for malignancy. IMPRESSION: No mammographic evidence of malignancy. A result letter of this screening mammogram will be mailed directly to the patient. RECOMMENDATION: Screening mammogram in one year. (Code:SM-B-01Y) BI-RADS CATEGORY  1: Negative. Electronically Signed   By: Rosina Gelineau M.D.   On: 01/19/2024 10:26   DG Chest 2 View Result Date: 03/12/2024 CLINICAL DATA:  Cough. EXAM: CHEST - 2 VIEW COMPARISON:  Chest radiograph dated 06/24/2016. FINDINGS: No focal consolidation, pleural effusion or pneumothorax. The cardiac silhouette is within normal limits. No acute osseous pathology. IMPRESSION: No active cardiopulmonary disease. Electronically Signed   By: Vanetta Chou M.D.   On: 03/12/2024 15:22     Assessment & Plan:  Chest pain, unspecified type- Will evaluate with a CA CT scan. -     EKG 12-Lead -     Brain natriuretic peptide; Future -     Troponin I (High Sensitivity); Future -     D-dimer, quantitative; Future  Gastroesophageal reflux disease without esophagitis -     Pantoprazole  Sodium; Take 1 tablet (40 mg total) by mouth daily.  Dispense: 90 tablet; Refill: 1 -     CBC with Differential/Platelet; Future  Major depressive disorder with single episode, in partial remission (HCC) -     Sertraline  HCl; Take 1 tablet (100 mg total) by  mouth daily.  Dispense: 90 tablet; Refill: 0  Hyperlipidemia with target LDL less than 130 -     Rosuvastatin  Calcium ; Take 1 tablet (10 mg total) by mouth daily.  Dispense: 90 tablet; Refill: 1 -     TSH; Future -     Hepatic function panel; Future  Moderate persistent asthma without complication -     Budesonide ; Take 2 mLs (0.5 mg total) by nebulization 2 (two) times daily.  Dispense: 360 mL; Refill: 1 -     Airsupra; Inhale 2 puffs into the lungs 4 (four) times daily as needed.  Dispense: 32.1 g; Refill: 1 -     Albuterol  Sulfate; Take 3 mLs (2.5 mg total) by nebulization every 6 (six) hours as needed for wheezing or shortness of breath.  Dispense: 150 mL; Refill: 3  Moderate persistent asthma with acute exacerbation- Will treat with a systemic steroid. -     methylPREDNISolone  Acetate -     methylPREDNISolone  Acetate -     methylPREDNISolone  Acetate -     Airsupra; Inhale 2 puffs into the lungs 4 (four) times daily as needed.  Dispense: 32.1 g; Refill: 1 -     Albuterol  Sulfate; Take 3 mLs (2.5 mg total) by nebulization every 6 (six) hours as needed for wheezing or shortness of breath.  Dispense: 150 mL; Refill: 3  Essential hypertension- BP is well controlled. EKG is negative for LVH. -     Basic metabolic panel with GFR; Future -     CBC with Differential/Platelet; Future -     TSH; Future  Subacute cough -     DG Chest 2 View; Future     Follow-up: Return in about 3 months (around 06/12/2024).  Debby Molt, MD

## 2024-03-13 ENCOUNTER — Other Ambulatory Visit (HOSPITAL_COMMUNITY): Payer: Self-pay

## 2024-03-13 ENCOUNTER — Other Ambulatory Visit: Payer: Self-pay

## 2024-03-13 DIAGNOSIS — J4540 Moderate persistent asthma, uncomplicated: Secondary | ICD-10-CM | POA: Diagnosis not present

## 2024-03-19 ENCOUNTER — Telehealth

## 2024-04-04 ENCOUNTER — Other Ambulatory Visit (HOSPITAL_COMMUNITY): Payer: Self-pay

## 2024-04-09 ENCOUNTER — Ambulatory Visit: Payer: Medicare (Managed Care)

## 2024-04-16 ENCOUNTER — Other Ambulatory Visit: Payer: Self-pay

## 2024-04-16 ENCOUNTER — Other Ambulatory Visit (HOSPITAL_COMMUNITY): Payer: Self-pay

## 2024-04-16 DIAGNOSIS — J4540 Moderate persistent asthma, uncomplicated: Secondary | ICD-10-CM | POA: Diagnosis not present

## 2024-04-23 ENCOUNTER — Other Ambulatory Visit: Payer: Self-pay | Admitting: Internal Medicine

## 2024-04-23 ENCOUNTER — Other Ambulatory Visit: Payer: Self-pay

## 2024-04-23 ENCOUNTER — Other Ambulatory Visit (HOSPITAL_COMMUNITY): Payer: Self-pay

## 2024-04-23 DIAGNOSIS — M1A09X0 Idiopathic chronic gout, multiple sites, without tophus (tophi): Secondary | ICD-10-CM

## 2024-04-25 ENCOUNTER — Other Ambulatory Visit (HOSPITAL_BASED_OUTPATIENT_CLINIC_OR_DEPARTMENT_OTHER): Payer: Self-pay

## 2024-04-25 ENCOUNTER — Other Ambulatory Visit (HOSPITAL_COMMUNITY): Payer: Self-pay

## 2024-04-25 MED ORDER — COLCHICINE 0.6 MG PO TABS
0.6000 mg | ORAL_TABLET | Freq: Two times a day (BID) | ORAL | 0 refills | Status: DC
  Filled 2024-04-25: qty 180, 90d supply, fill #0

## 2024-04-25 NOTE — Telephone Encounter (Signed)
 Last OV 03/12/24 Next OV 05/04/24  Last refill 01/31/24 Qty #180/0

## 2024-05-04 ENCOUNTER — Ambulatory Visit (INDEPENDENT_AMBULATORY_CARE_PROVIDER_SITE_OTHER)

## 2024-05-04 VITALS — Ht 67.0 in | Wt 206.0 lb

## 2024-05-04 DIAGNOSIS — Z0000 Encounter for general adult medical examination without abnormal findings: Secondary | ICD-10-CM | POA: Diagnosis not present

## 2024-05-04 NOTE — Patient Instructions (Signed)
 Ms. Dame , Thank you for taking time out of your busy schedule to complete your Annual Wellness Visit with me. I enjoyed our conversation and look forward to speaking with you again next year. I, as well as your care team,  appreciate your ongoing commitment to your health goals. Please review the following plan we discussed and let me know if I can assist you in the future. Your Game plan/ To Do List    Referrals: If you haven't heard from the office you've been referred to, please reach out to them at the phone provided.   Follow up Visits: We will see or speak with you next year for your Next Medicare AWV with our clinical staff Have you seen your provider in the last 6 months (3 months if uncontrolled diabetes)? No  Clinician Recommendations:  Aim for 30 minutes of exercise or brisk walking, 6-8 glasses of water, and 5 servings of fruits and vegetables each day.       This is a list of the screenings recommended for you:  Health Maintenance  Topic Date Due   Colon Cancer Screening  Never done   Zoster (Shingles) Vaccine (1 of 2) Never done   DEXA scan (bone density measurement)  Never done   Flu Shot  04/20/2024   Medicare Annual Wellness Visit  05/04/2025   Mammogram  01/16/2026   DTaP/Tdap/Td vaccine (4 - Td or Tdap) 09/09/2030   Pneumococcal Vaccine for age over 50  Completed   Hepatitis C Screening  Completed   HPV Vaccine  Aged Out   Meningitis B Vaccine  Aged Out   COVID-19 Vaccine  Discontinued    Advanced directives: (Declined) Advance directive discussed with you today. Even though you declined this today, please call our office should you change your mind, and we can give you the proper paperwork for you to fill out. Advance Care Planning is important because it:  [x]  Makes sure you receive the medical care that is consistent with your values, goals, and preferences  [x]  It provides guidance to your family and loved ones and reduces their decisional burden about  whether or not they are making the right decisions based on your wishes.  Follow the link provided in your after visit summary or read over the paperwork we have mailed to you to help you started getting your Advance Directives in place. If you need assistance in completing these, please reach out to us  so that we can help you!

## 2024-05-04 NOTE — Progress Notes (Addendum)
 Subjective:  Please attest and cosign this visit due to patients primary care provider not being in the office at the time the visit was completed.  (Pt of Dr Debby Hope)   Alyssa Powell is a 73 y.o. who presents for a Medicare Wellness preventive visit.  As a reminder, Annual Wellness Visits don't include a physical exam, and some assessments may be limited, especially if this visit is performed virtually. We may recommend an in-person follow-up visit with your provider if needed.  Visit Complete: Virtual I connected with  Alyssa Powell on 05/04/24 by a audio enabled telemedicine application and verified that I am speaking with the correct person using two identifiers.  Patient Location: Home  Provider Location: Office/Clinic  I discussed the limitations of evaluation and management by telemedicine. The patient expressed understanding and agreed to proceed.  Vital Signs: Because this visit was a virtual/telehealth visit, some criteria may be missing or patient reported. Any vitals not documented were not able to be obtained and vitals that have been documented are patient reported.  VideoDeclined- This patient declined Librarian, academic. Therefore the visit was completed with audio only.  Persons Participating in Visit: Patient assisted by Alyssa Powell.  AWV Questionnaire: No: Patient Medicare AWV questionnaire was not completed prior to this visit.  Cardiac Risk Factors include: advanced age (>9men, >68 women);dyslipidemia;hypertension;obesity (BMI >30kg/m2)     Objective:    Today's Vitals   05/04/24 0932  Weight: 206 lb (93.4 kg)  Height: 5' 7 (1.702 m)   Body mass index is 32.26 kg/m.     05/04/2024    9:40 AM 04/29/2023    1:10 PM 04/05/2023    1:48 PM 09/09/2020    2:21 PM 06/13/2017    1:58 PM 08/03/2016    2:36 PM 06/24/2016    4:31 PM  Advanced Directives  Does Patient Have a Medical Advance Directive? No Yes No No No  No   No   Type of Advance Directive  Healthcare Power of Attorney       Does patient want to make changes to medical advance directive?  No - Patient declined       Copy of Healthcare Power of Attorney in Chart?  No - copy requested       Would patient like information on creating a medical advance directive? No - Patient declined  No - Patient declined Yes (Inpatient - patient defers creating a medical advance directive at this time - Information given) No - Patient declined  No - patient declined information       Data saved with a previous flowsheet row definition    Current Medications (verified) Outpatient Encounter Medications as of 05/04/2024  Medication Sig   albuterol  (PROVENTIL ) (2.5 MG/3ML) 0.083% nebulizer solution Take 3 mLs (2.5 mg total) by nebulization every 6 (six) hours as needed for wheezing or shortness of breath.   Albuterol -Budesonide  (AIRSUPRA ) 90-80 MCG/ACT AERO Inhale 2 puffs into the lungs 4 (four) times daily as needed.   budesonide  (PULMICORT ) 0.5 MG/2ML nebulizer solution Take 2 mLs (0.5 mg total) by nebulization 2 (two) times daily.   cetirizine  (ZYRTEC ) 10 MG tablet TAKE 1 TABLET BY MOUTH EVERYDAY AT BEDTIME   colchicine  0.6 MG tablet Take 1 tablet (0.6 mg total) by mouth 2 (two) times daily.   memantine  (NAMENDA ) 5 MG tablet Take 1 tablet (5 mg total) by mouth 2 (two) times daily.   montelukast  (SINGULAIR ) 10 MG tablet Take 1 tablet (10  mg total) by mouth at bedtime.   pantoprazole  (PROTONIX ) 40 MG tablet Take 1 tablet (40 mg total) by mouth daily.   rosuvastatin  (CRESTOR ) 10 MG tablet Take 1 tablet (10 mg total) by mouth daily.   sertraline  (ZOLOFT ) 100 MG tablet Take 1 tablet (100 mg total) by mouth daily.   Zoster Vaccine Adjuvanted (SHINGRIX ) injection Inject 0.5 mLs into the muscle once for 1 dose. (Patient not taking: Reported on 05/04/2024)   No facility-administered encounter medications on file as of 05/04/2024.    Allergies (verified) Amoxicillin, Codeine  phosphate, and Penicillins   History: Past Medical History:  Diagnosis Date   Allergy    Asthma    Hypertension    Mental retardation    Past Surgical History:  Procedure Laterality Date   HERNIA REPAIR     Family History  Problem Relation Age of Onset   Arthritis Mother    Asthma Father    Alcoholism Father    Asthma Sister    Diabetes Brother    Alcoholism Brother    Social History   Socioeconomic History   Marital status: Single    Spouse name: Not on file   Number of children: 2   Years of education: 7   Highest education level: Not on file  Occupational History   Not on file  Tobacco Use   Smoking status: Never   Smokeless tobacco: Never  Vaping Use   Vaping status: Never Used  Substance and Sexual Activity   Alcohol use: Not Currently   Drug use: Never   Sexual activity: Never  Other Topics Concern   Not on file  Social History Narrative   ** Merged History Encounter **    Lives with her mother   Right handed   Drinks caffeine   Unable to work   Social Drivers of Corporate investment banker Strain: Low Risk  (05/04/2024)   Overall Financial Resource Strain (CARDIA)    Difficulty of Paying Living Expenses: Not hard at all  Food Insecurity: No Food Insecurity (05/04/2024)   Hunger Vital Sign    Worried About Running Out of Food in the Last Year: Never true    Ran Out of Food in the Last Year: Never true  Transportation Needs: No Transportation Needs (05/04/2024)   PRAPARE - Administrator, Civil Service (Medical): No    Lack of Transportation (Non-Medical): No  Physical Activity: Insufficiently Active (05/04/2024)   Exercise Vital Sign    Days of Exercise per Week: 7 days    Minutes of Exercise per Session: 20 min  Stress: No Stress Concern Present (05/04/2024)   [employer] of Occupational Health - Occupational Stress Questionnaire    Feeling of Stress: Not at all  Social Connections: Socially Isolated (05/04/2024)   Social  Connection and Isolation Panel    Frequency of Communication with Friends and Family: More than three times a week    Frequency of Social Gatherings with Friends and Family: Twice a week    Attends Religious Services: Never    Database administrator or Organizations: No    Attends Engineer, structural: Never    Marital Status: Never married    Tobacco Counseling Counseling given: Not Answered    Clinical Intake:  Pre-visit preparation completed: Yes  Pain : No/denies pain     BMI - recorded: 32.26 Nutritional Status: BMI > 30  Obese Nutritional Risks: None Diabetes: No  Lab Results  Component Value Date  HGBA1C 5.7 03/23/2023   HGBA1C 6.1 06/28/2022   HGBA1C 5.5 10/12/2018     How often do you need to have someone help you when you read instructions, pamphlets, or other written materials from your doctor or pharmacy?: 5 - Always (CareGiver/Sister assists)  Interpreter Needed?: No  Information entered by :: Verdie Saba, CMA   Activities of Daily Living     05/04/2024    9:34 AM  In your present state of health, do you have any difficulty performing the following activities:  Hearing? 0  Vision? 0  Difficulty concentrating or making decisions? 1  Comment CareGiver/Sister assists  Walking or climbing stairs? 0  Dressing or bathing? 1  Comment CareGiver/Sister assists  Doing errands, shopping? 1  Comment Public relations account executive and eating ? Y  Comment CareGiver/Sister assists  Using the Toilet? Y  Comment CareGiver/Sister assists  In the past six months, have you accidently leaked urine? Y  Comment wears a depend  Do you have problems with loss of bowel control? Y  Comment wears a depend -CareGiver/Sister assists  Managing your Medications? Y  Comment CareGiver/Sister assists  Managing your Finances? Y  Comment Sister Database administrator or managing your Housekeeping? Y  Comment CareGiver/Sister assists    Patient Care  Team: Joshua Debby CROME, MD as PCP - General (Internal Medicine) Wallace, Juana M, RN as VBCI Care Management  I have updated your Care Teams any recent Medical Services you may have received from other providers in the past year.     Assessment:   This is a routine wellness examination for Annaliz.  Hearing/Vision screen Hearing Screening - Comments:: Denies hearing difficulties   Vision Screening - Comments:: Denies vision concerns   Goals Addressed               This Visit's Progress     Patient Stated (pt-stated)        Patient's sister stated that the goal is wanting to move more and walk good       Depression Screen     05/04/2024    9:36 AM 06/15/2023    3:03 PM 04/07/2023    8:24 AM 03/23/2023   11:04 AM 02/03/2022    3:37 PM 12/04/2021    1:52 PM 04/29/2021    4:15 PM  PHQ 2/9 Scores  PHQ - 2 Score 0 0 0  0  0  PHQ- 9 Score 0 0   0  0  Exception Documentation    Medical reason  Patient refusal     Fall Risk     05/04/2024    9:35 AM 02/16/2024    9:48 AM 06/15/2023    3:03 PM 04/29/2023    1:10 PM 04/05/2023    1:47 PM  Fall Risk   Falls in the past year? 0 0 0 0 0  Number falls in past yr: 0  0 0 0  Injury with Fall? 0  0 0 0  Risk for fall due to : No Fall Risks  No Fall Risks  No Fall Risks  Follow up Falls evaluation completed;Falls prevention discussed  Falls evaluation completed Falls evaluation completed Falls prevention discussed    MEDICARE RISK AT HOME:  Medicare Risk at Home Any stairs in or around the home?: No If so, are there any without handrails?: No Home free of loose throw rugs in walkways, pet beds, electrical cords, etc?: Yes Adequate lighting in your home to reduce risk of falls?: Yes  Life alert?: No Use of a cane, walker or w/c?: No Grab bars in the bathroom?: No Shower chair or bench in shower?: Yes Elevated toilet seat or a handicapped toilet?: Yes  TIMED UP AND GO:  Was the test performed?  No  Cognitive Function: 6CIT  completed    05/04/2024    9:50 AM 04/29/2023    6:00 PM 09/09/2020    2:37 PM 10/12/2018    2:58 PM  MMSE - Mini Mental State Exam  Not completed: Unable to complete -- Unable to complete   Orientation to time  2  2  Orientation to Place  0  3  Registration  0  2  Attention/ Calculation  0  0  Recall  0  0  Language- name 2 objects  2  2  Language- repeat  1  0  Language- follow 3 step command  3  3  Language- read & follow direction  1  0  Write a sentence  0  0  Copy design  0  0  Total score  9  12        04/05/2023    1:48 PM  6CIT Screen  What Year? --  What month? --  What time? --  Count back from 20 --  Months in reverse --  Repeat phrase --    Immunizations Immunization History  Administered Date(s) Administered   Fluad Quad(high Dose 65+) 06/28/2022   Fluad Trivalent(High Dose 65+) 07/12/2023   Influenza Whole 08/07/2009   Influenza,inj,Quad PF,6+ Mos 07/09/2013, 08/05/2014, 07/08/2015, 09/09/2020   Moderna SARS-COV2 Booster Vaccination 10/29/2020, 03/30/2021   Moderna Sars-Covid-2 Vaccination 04/02/2020, 04/30/2020   PNEUMOCOCCAL CONJUGATE-20 06/28/2022   Td 07/21/1994, 03/05/2009   Tdap 09/09/2020    Screening Tests Health Maintenance  Topic Date Due   Colonoscopy  Never done   Zoster Vaccines- Shingrix  (1 of 2) Never done   DEXA SCAN  Never done   INFLUENZA VACCINE  04/20/2024   Medicare Annual Wellness (AWV)  05/04/2025   MAMMOGRAM  01/16/2026   DTaP/Tdap/Td (4 - Td or Tdap) 09/09/2030   Pneumococcal Vaccine: 50+ Years  Completed   Hepatitis C Screening  Completed   HPV VACCINES  Aged Out   Meningococcal B Vaccine  Aged Out   COVID-19 Vaccine  Discontinued    Health Maintenance  Health Maintenance Due  Topic Date Due   Colonoscopy  Never done   Zoster Vaccines- Shingrix  (1 of 2) Never done   DEXA SCAN  Never done   INFLUENZA VACCINE  04/20/2024   Health Maintenance Items Addressed: 05/04/2024  Additional Screening:  Vision  Screening: Recommended annual ophthalmology exams for early detection of glaucoma and other disorders of the eye. Would you like a referral to an eye doctor? No    Dental Screening: Recommended annual dental exams for proper oral hygiene  Community Resource Referral / Chronic Care Management: CRR required this visit?  No   CCM required this visit?  No   Plan:    I have personally reviewed and noted the following in the patient's chart:   Medical and social history Use of alcohol, tobacco or illicit drugs  Current medications and supplements including opioid prescriptions. Patient is not currently taking opioid prescriptions. Functional ability and status Nutritional status Physical activity Advanced directives List of other physicians Hospitalizations, surgeries, and ER visits in previous 12 months Vitals Screenings to include cognitive, depression, and falls Referrals and appointments  In addition, I have reviewed and discussed  with patient certain preventive protocols, quality metrics, and best practice recommendations. A written personalized care plan for preventive services as well as general preventive health recommendations were provided to patient.   Verdie CHRISTELLA Saba, CMA   05/04/2024   After Visit Summary: (Declined) Due to this being a telephonic visit, with patients personalized plan was offered to patient but patient Declined AVS at this time   Notes: Nothing significant to report at this time.

## 2024-05-05 ENCOUNTER — Other Ambulatory Visit: Payer: Self-pay | Admitting: Physician Assistant

## 2024-05-07 ENCOUNTER — Other Ambulatory Visit (HOSPITAL_COMMUNITY): Payer: Self-pay

## 2024-05-28 ENCOUNTER — Other Ambulatory Visit: Payer: Self-pay

## 2024-05-29 NOTE — Patient Instructions (Signed)
 Visit Information  Thank you for taking time to visit with me today. Please don't hesitate to contact me if I can be of assistance to you before our next scheduled appointment.  Your next care management appointment is by telephone on 06/11/24 at 2:00 pm  Please call the care guide team at [phone] if you need to cancel, schedule, or reschedule an appointment.   Please call the Suicide and Crisis Lifeline: 988 call the [place]  National Suicide Prevention Lifeline: 210-483-8226 or TTY: (210)255-6229 TTY 220-664-6120) to talk to a trained counselor call 1-800-273-TALK (toll free, 24 hour hotline) if you are experiencing a Mental Health or Behavioral Health Crisis or need someone to talk to.  Heddy Shutter, RN, MSN, BSN, CCM   [facility], Population Health Case Manager Phone: [phone]

## 2024-05-29 NOTE — Patient Outreach (Signed)
 Complex Care Management   Visit Note  05/29/2024-late entry for 05/28/24  Name:  Alyssa Powell MRN: [id number] DOB: 04-29-1951  Situation: Referral received for Complex Care Management related to HTN I obtained verbal consent from [facility] sister/dpr.  Visit completed with [name]  on the phone  Background:   Past Medical History:  Diagnosis Date   Allergy    Asthma    Hypertension    Mental retardation    Assessment: Sister [name] states she does not have a lot of time to talk. She reports patient is doing good, but could benefit from HHPT/OT. Patient continues to have in home aid 5 days/week 3 hours, Saturday and Sunday 1hr/day. Patient Reported Symptoms:  Cognitive Cognitive Status: Requires Assistance Decision Making (spoke with sister [name] patient's healthcare)      Neurological Neurological Review of Symptoms: No symptoms reported Neurological Management Strategies: Medication therapy, Routine screening, Adequate rest, Activity Neurological Comment: patient with dementia-sister with no questions or issues at this time.  HEENT HEENT Symptoms Reported: No symptoms reported      Cardiovascular Cardiovascular Symptoms Reported: No symptoms reported Does patient have uncontrolled Hypertension?: No (per office visit with PCP 03/12/24 BP controlled.)    Respiratory Respiratory Symptoms Reported: No symptoms reported    Endocrine Endocrine Symptoms Reported: No symptoms reported    Gastrointestinal Gastrointestinal Symptoms Reported: No symptoms reported      Genitourinary Genitourinary Symptoms Reported: No symptoms reported    Integumentary Integumentary Symptoms Reported: No symptoms reported    Musculoskeletal Musculoskelatal Symptoms Reviewed: Unsteady gait Additional Musculoskeletal Details: sister is requesting HHPT/OT to work with patient Musculoskeletal Management Strategies: Routine screening, Activity Musculoskeletal  Self-Management Outcome: 4 (good) Falls in the past year?: No    Psychosocial Psychosocial Symptoms Reported: No symptoms reported (spoke with sister/dpr)         There were no vitals filed for this visit.  Medications Reviewed Today     Reviewed by Lorin Hauck M, RN (Registered Nurse) on 05/28/24 at 1042  Med List Status: <None>   Medication Order Taking? Sig Documenting Provider Last Dose Status Informant  albuterol  (PROVENTIL ) (2.5 MG/3ML) 0.083% nebulizer solution [id number]  Take 3 mLs (2.5 mg total) by nebulization every 6 (six) hours as needed for wheezing or shortness of breath. Joshua Debby CROME, MD  Active   Albuterol -Budesonide  (AIRSUPRA ) 90-80 MCG/ACT TERESE [id number]  Inhale 2 puffs into the lungs 4 (four) times daily as needed. Joshua Debby CROME, MD  Active   budesonide  (PULMICORT ) 0.5 MG/2ML nebulizer solution [id number]  Take 2 mLs (0.5 mg total) by nebulization 2 (two) times daily. Joshua Debby CROME, MD  Active   cetirizine  (ZYRTEC ) 10 MG tablet [id number]  TAKE 1 TABLET BY MOUTH EVERYDAY AT BEDTIME Tanda Bleacher, MD  Active   colchicine  0.6 MG tablet [id number]  Take 1 tablet (0.6 mg total) by mouth 2 (two) times daily. Joshua Debby CROME, MD  Active   memantine  (NAMENDA ) 5 MG tablet [id number]  Take 1 tablet (5 mg total) by mouth 2 (two) times daily. Wertman, Sara E, PA-C  Active   montelukast  (SINGULAIR ) 10 MG tablet [id number]  Take 1 tablet (10 mg total) by mouth at bedtime. Johnny Garnette LABOR, MD  Active   pantoprazole  (PROTONIX ) 40 MG tablet [id number]  Take 1 tablet (40 mg total) by mouth daily. Joshua Debby CROME, MD  Active   rosuvastatin  (CRESTOR ) 10 MG tablet [id number]  Take 1 tablet (10 mg total) by mouth daily. Joshua Debby CROME,  MD  Active   sertraline  (ZOLOFT ) 100 MG tablet [id number]  Take 1 tablet (100 mg total) by mouth daily. Joshua Debby CROME, MD  Active   Zoster Vaccine Adjuvanted (SHINGRIX ) injection [id number]  Inject 0.5 mLs into the muscle once for 1 dose.  Patient not  taking: Reported on 05/04/2024   Joshua Debby CROME, MD  Expired 03/12/24 2359           Recommendation:   Home Health requests: Occupational Therapy Physical therapy  Follow Up Plan:   Telephone follow up appointment date/time:  06/11/24 at 2:00 pm  Heddy Shutter, RN, MSN, BSN, CCM [place]  [facility], Population Health Case Manager Phone: [phone]

## 2024-06-01 ENCOUNTER — Telehealth: Payer: Self-pay

## 2024-06-01 NOTE — Patient Instructions (Signed)
 Visit Information  Thank you for taking time to visit with me today. Please don't hesitate to contact me if I can be of assistance to you before our next scheduled appointment.  Your next care management appointment is by telephone on 03/12/24 at 2:00 pm   Please call the care guide team at [phone] if you need to cancel, schedule, or reschedule an appointment.   Please call the Suicide and Crisis Lifeline: 988 call the [place]  National Suicide Prevention Lifeline: 587-537-4541 or TTY: (579)621-6343 TTY 5015588109) to talk to a trained counselor call 1-800-273-TALK (toll free, 24 hour hotline) if you are experiencing a Mental Health or Behavioral Health Crisis or need someone to talk to.  Heddy Shutter, RN, MSN, BSN, CCM [place]  [facility], Population Health Case Manager Phone: [phone]

## 2024-06-01 NOTE — Patient Outreach (Signed)
 Complex Care Management   Visit Note  06/01/2024  Name:  Alyssa Powell MRN: [id number] DOB: 1951/06/13  Situation: RNCM received message from PCP that patient will need to have updated office visit to do to [facility] referral. RNCM called-spoke with Vina Bennett(sister/dpr). RNCM provided this information to [name] who expressed understanding and states she will call next week to schedule an appointment.   Recommendation:   PCP Follow-up  Follow Up Plan:   Telephone follow up appointment date/time:  06/11/24 at 2:00 pm  Heddy Shutter, RN, MSN, BSN, CCM [place]  [facility], Population Health Case Manager Phone: [phone]

## 2024-06-11 ENCOUNTER — Telehealth: Payer: Self-pay

## 2024-06-14 ENCOUNTER — Other Ambulatory Visit: Payer: Self-pay | Admitting: Internal Medicine

## 2024-06-14 DIAGNOSIS — J4540 Moderate persistent asthma, uncomplicated: Secondary | ICD-10-CM

## 2024-06-14 DIAGNOSIS — J4541 Moderate persistent asthma with (acute) exacerbation: Secondary | ICD-10-CM

## 2024-06-14 NOTE — Telephone Encounter (Unsigned)
 Copied from CRM [id number]. Topic: Clinical - Medication Refill >> Jun 14, 2024  2:36 PM Shereese L wrote: Medication:  Albuterol -Budesonide  (AIRSUPRA ) 90-80 MCG/ACT AERO    Has the patient contacted their pharmacy? Yes (Agent: If no, request that the patient contact the pharmacy for the refill. If patient does not wish to contact the pharmacy document the reason why and proceed with request.) (Agent: If yes, when and what did the pharmacy advise?)  This is the patient's preferred pharmacy:   [place] [facility] [address] [place] [address] Phone: [phone] Fax: [phone]  Is this the correct pharmacy for this prescription? Yes If no, delete pharmacy and type the correct one.   Has the prescription been filled recently? Yes  Is the patient out of the medication? Yes  Has the patient been seen for an appointment in the last year OR does the patient have an upcoming appointment? Yes  Can we respond through MyChart? Yes  Agent: Please be advised that Rx refills may take up to 3 business days. We ask that you follow-up with your pharmacy.

## 2024-06-14 NOTE — Telephone Encounter (Unsigned)
 Copied from CRM #[id number]. Topic: Clinical - Medication Refill >> Jun 14, 2024 10:28 AM Burnard DEL wrote: Medication: montelukast  (SINGULAIR ) 10 MG tablet  Has the patient contacted their pharmacy? No (Agent: If no, request that the patient contact the pharmacy for the refill. If patient does not wish to contact the pharmacy document the reason why and proceed with request.) (Agent: If yes, when and what did the pharmacy advise?)  This is the patient's preferred pharmacy:    [place] [facility] [address] [place] [address] Phone: [phone] Fax: [phone]  Is this the correct pharmacy for this prescription? Yes If no, delete pharmacy and type the correct one.   Has the prescription been filled recently? No  Is the patient out of the medication? No(few left)  Has the patient been seen for an appointment in the last year OR does the patient have an upcoming appointment? Yes  Can we respond through MyChart? nO  Agent: Please be advised that Rx refills may take up to 3 business days. We ask that you follow-up with your pharmacy.

## 2024-06-15 ENCOUNTER — Ambulatory Visit: Admitting: Family Medicine

## 2024-06-15 ENCOUNTER — Other Ambulatory Visit (HOSPITAL_COMMUNITY): Payer: Self-pay

## 2024-06-15 NOTE — Progress Notes (Deleted)
   Acute Office Visit  Subjective:     Patient ID: Alyssa Powell, female    DOB: 1950/10/04, 73 y.o.   MRN: [id number]  No chief complaint on file.   HPI  Discussed the use of AI scribe software for clinical note transcription with the patient, who gave verbal consent to proceed.  History of Present Illness      ROS Per HPI      Objective:    There were no vitals taken for this visit.   Physical Exam Vitals and nursing note reviewed.  Constitutional:      General: She is not in acute distress.    Appearance: Normal appearance. She is normal weight.  HENT:     Head: Normocephalic and atraumatic.     Right Ear: External ear normal.     Left Ear: External ear normal.     Nose: Nose normal.     Mouth/Throat:     Mouth: Mucous membranes are moist.     Pharynx: Oropharynx is clear.  Eyes:     Extraocular Movements: Extraocular movements intact.     Pupils: Pupils are equal, round, and reactive to light.  Cardiovascular:     Rate and Rhythm: Normal rate and regular rhythm.     Pulses: Normal pulses.     Heart sounds: Normal heart sounds.  Pulmonary:     Effort: Pulmonary effort is normal. No respiratory distress.     Breath sounds: Normal breath sounds. No wheezing, rhonchi or rales.  Musculoskeletal:        General: Normal range of motion.     Cervical back: Normal range of motion.     Right lower leg: No edema.     Left lower leg: No edema.  Lymphadenopathy:     Cervical: No cervical adenopathy.  Neurological:     General: No focal deficit present.     Mental Status: She is alert and oriented to person, place, and time.  Psychiatric:        Mood and Affect: Mood normal.        Thought Content: Thought content normal.     No results found for any visits on 06/15/24.      Assessment & Plan:   Assessment and Plan Assessment & Plan      No orders of the defined types were placed in this encounter.    No orders of the defined types were placed  in this encounter.   No follow-ups on file.  Corean LITTIE Ku, FNP

## 2024-06-17 NOTE — Progress Notes (Signed)
 Acute Office Visit  Subjective:     Patient ID: Alyssa Powell, female    DOB: Oct 23, 1950, 73 y.o.   MRN: [id number]  Chief Complaint  Patient presents with   Acute Visit    HPI  Discussed the use of AI scribe software for clinical note transcription with the patient, who gave verbal consent to proceed.  History of Present Illness Alyssa Powell is a 73 year old female with asthma who presents with an asthma flare-up and congestion. Accompanied by her husband today.  Asthma exacerbation - Wheezing and congestion with symptom worsening on Friday - Shortness of breath and mild cough - No fever - No known exposure to COVID-19 or influenza - Current asthma management includes nebulizer, oral albuterol  tablets three times daily, Arsupra inhaler, Singulair , and Symbicort     ROS Per HPI      Objective:    BP (!) 160/104 (BP Location: Left Arm, Patient Position: Sitting)   Pulse 79   Temp 97.6 F (36.4 C) (Temporal)   Ht 5' 7 (1.702 m)   Wt 207 lb 12.8 oz (94.3 kg)   SpO2 97%   BMI 32.55 kg/m    Physical Exam Vitals and nursing note reviewed.  Constitutional:      General: She is not in acute distress.    Appearance: Normal appearance. She is normal weight.  HENT:     Head: Normocephalic and atraumatic.     Right Ear: External ear normal.     Left Ear: External ear normal.     Nose: Nose normal.     Mouth/Throat:     Mouth: Mucous membranes are moist.  Eyes:     Extraocular Movements: Extraocular movements intact.     Pupils: Pupils are equal, round, and reactive to light.  Cardiovascular:     Rate and Rhythm: Normal rate and regular rhythm.     Pulses: Normal pulses.     Heart sounds: Normal heart sounds.  Pulmonary:     Effort: Pulmonary effort is normal. No respiratory distress.     Breath sounds: Wheezing present. No rhonchi or rales.     Comments: Persistent dry cough Musculoskeletal:        General: Normal range of motion.     Cervical back:  Normal range of motion.     Right lower leg: No edema.     Left lower leg: No edema.  Lymphadenopathy:     Cervical: No cervical adenopathy.  Neurological:     General: No focal deficit present.     Mental Status: She is alert and oriented to person, place, and time.  Psychiatric:        Mood and Affect: Mood normal.        Thought Content: Thought content normal.     Results for orders placed or performed in visit on 06/18/24  POC COVID-19 BinaxNow  Result Value Ref Range   SARS Coronavirus 2 Ag Negative Negative  POCT Influenza A/B  Result Value Ref Range   Influenza A, POC Negative Negative   Influenza B, POC Negative Negative        Assessment & Plan:   Assessment and Plan Assessment & Plan Moderate persistent asthma with acute exacerbation Flare-up of moderate persistent asthma with wheezing, shortness of breath, and cough. Reduced air movement on the left side suggests possible differential diagnosis beyond asthma, including infection or other pulmonary issues. Considered COVID-19 due to prevalence, despite absence of fever. - Ordered chest X-ray to evaluate  for other pulmonary issues. - Performed COVID-19 and flu tests. - Refilled nebulizer solution and albuterol  tablets. - Sent prednisone  prescription to pharmacy for backup. - Recommended Delsym  or Robitussin (without decongestant) for cough.  GERD without esophagitis - stable, refilled protonix   Hyperlipidemia with target LDL under 130 - Stable, refilled rosuvastatin   Single episode major depressive disorder, in partial remission -Stable, refilled sertraline   Dementia associated with other underlying disease with behavioral disturbance - Stable, chronic - Refilled memantine  and sertraline   Generalized weakness - Ordered eval for OT and PT in the home for mobilization.  Medication management - Refills sent today     Orders Placed This Encounter  Procedures   DG Chest 2 View    Standing Status:    Future    Number of Occurrences:   1    Expiration Date:   12/16/2024    Reason for Exam (SYMPTOM  OR DIAGNOSIS REQUIRED):   cough, SOB    Preferred imaging location?:   [facility]   POC COVID-19 BinaxNow   POCT Influenza A/B     Meds ordered this encounter  Medications   albuterol  (PROVENTIL ) (2.5 MG/3ML) 0.083% nebulizer solution    Sig: Take 3 mLs (2.5 mg total) by nebulization every 6 (six) hours as needed for wheezing or shortness of breath.    Dispense:  150 mL    Refill:  3   budesonide  (PULMICORT ) 0.5 MG/2ML nebulizer solution    Sig: Take 2 mLs (0.5 mg total) by nebulization 2 (two) times daily.    Dispense:  360 mL    Refill:  1   albuterol  (PROVENTIL ) 4 MG tablet    Sig: Take 1 tablet (4 mg total) by mouth 3 (three) times daily.    Dispense:  90 tablet    Refill:  1   Albuterol -Budesonide  (AIRSUPRA ) 90-80 MCG/ACT AERO    Sig: Inhale 2 puffs into the lungs 4 (four) times daily as needed.    Dispense:  32.1 g    Refill:  1   memantine  (NAMENDA ) 5 MG tablet    Sig: Take 1 tablet (5 mg total) by mouth 2 (two) times daily.    Dispense:  60 tablet    Refill:  11   montelukast  (SINGULAIR ) 10 MG tablet    Sig: Take 1 tablet (10 mg total) by mouth at bedtime.    Dispense:  90 tablet    Refill:  1   pantoprazole  (PROTONIX ) 40 MG tablet    Sig: Take 1 tablet (40 mg total) by mouth daily.    Dispense:  90 tablet    Refill:  1   rosuvastatin  (CRESTOR ) 10 MG tablet    Sig: Take 1 tablet (10 mg total) by mouth daily.    Dispense:  90 tablet    Refill:  1   sertraline  (ZOLOFT ) 100 MG tablet    Sig: Take 1 tablet (100 mg total) by mouth daily.    Dispense:  90 tablet    Refill:  0   predniSONE  (DELTASONE ) 20 MG tablet    Sig: Take 2 tablets (40 mg total) by mouth daily for 5 days.    Dispense:  10 tablet    Refill:  0   guaiFENesin  (ROBITUSSIN) 100 MG/5ML liquid    Sig: Take 5 mLs by mouth every 4 (four) hours as needed for cough or to loosen phlegm.     Dispense:  120 mL    Refill:  0   methylPREDNISolone  acetate (DEPO-MEDROL )  injection 40 mg    No follow-ups on file.  Corean LITTIE Ku, FNP

## 2024-06-18 ENCOUNTER — Encounter: Payer: Self-pay | Admitting: Family Medicine

## 2024-06-18 ENCOUNTER — Ambulatory Visit: Admitting: Family Medicine

## 2024-06-18 ENCOUNTER — Ambulatory Visit (INDEPENDENT_AMBULATORY_CARE_PROVIDER_SITE_OTHER)

## 2024-06-18 ENCOUNTER — Other Ambulatory Visit: Payer: Self-pay

## 2024-06-18 ENCOUNTER — Other Ambulatory Visit (HOSPITAL_COMMUNITY): Payer: Self-pay

## 2024-06-18 VITALS — BP 160/104 | HR 79 | Temp 97.6°F | Ht 67.0 in | Wt 207.8 lb

## 2024-06-18 DIAGNOSIS — I700 Atherosclerosis of aorta: Secondary | ICD-10-CM | POA: Diagnosis not present

## 2024-06-18 DIAGNOSIS — E785 Hyperlipidemia, unspecified: Secondary | ICD-10-CM

## 2024-06-18 DIAGNOSIS — R531 Weakness: Secondary | ICD-10-CM

## 2024-06-18 DIAGNOSIS — F02818 Dementia in other diseases classified elsewhere, unspecified severity, with other behavioral disturbance: Secondary | ICD-10-CM

## 2024-06-18 DIAGNOSIS — J4541 Moderate persistent asthma with (acute) exacerbation: Secondary | ICD-10-CM

## 2024-06-18 DIAGNOSIS — K219 Gastro-esophageal reflux disease without esophagitis: Secondary | ICD-10-CM | POA: Diagnosis not present

## 2024-06-18 DIAGNOSIS — J4540 Moderate persistent asthma, uncomplicated: Secondary | ICD-10-CM

## 2024-06-18 DIAGNOSIS — Z79899 Other long term (current) drug therapy: Secondary | ICD-10-CM

## 2024-06-18 DIAGNOSIS — F324 Major depressive disorder, single episode, in partial remission: Secondary | ICD-10-CM | POA: Diagnosis not present

## 2024-06-18 DIAGNOSIS — R0602 Shortness of breath: Secondary | ICD-10-CM | POA: Diagnosis not present

## 2024-06-18 DIAGNOSIS — I771 Stricture of artery: Secondary | ICD-10-CM | POA: Diagnosis not present

## 2024-06-18 DIAGNOSIS — R059 Cough, unspecified: Secondary | ICD-10-CM | POA: Diagnosis not present

## 2024-06-18 LAB — POCT INFLUENZA A/B
Influenza A, POC: NEGATIVE
Influenza B, POC: NEGATIVE

## 2024-06-18 LAB — POC COVID19 BINAXNOW: SARS Coronavirus 2 Ag: NEGATIVE

## 2024-06-18 MED ORDER — METHYLPREDNISOLONE ACETATE 40 MG/ML IJ SUSP
40.0000 mg | Freq: Once | INTRAMUSCULAR | Status: AC
  Administered 2024-06-18: 40 mg via INTRAMUSCULAR

## 2024-06-18 MED ORDER — ROSUVASTATIN CALCIUM 10 MG PO TABS
10.0000 mg | ORAL_TABLET | Freq: Every day | ORAL | 1 refills | Status: AC
  Filled 2024-06-18 – 2024-07-14 (×2): qty 90, 90d supply, fill #0
  Filled 2024-10-07: qty 90, 90d supply, fill #1

## 2024-06-18 MED ORDER — BUDESONIDE 0.5 MG/2ML IN SUSP
0.5000 mg | Freq: Two times a day (BID) | RESPIRATORY_TRACT | 1 refills | Status: AC
  Filled 2024-06-18: qty 360, 90d supply, fill #0

## 2024-06-18 MED ORDER — SERTRALINE HCL 100 MG PO TABS
100.0000 mg | ORAL_TABLET | Freq: Every day | ORAL | 0 refills | Status: DC
  Filled 2024-06-18 – 2024-07-22 (×2): qty 90, 90d supply, fill #0

## 2024-06-18 MED ORDER — ALBUTEROL SULFATE (2.5 MG/3ML) 0.083% IN NEBU
2.5000 mg | INHALATION_SOLUTION | Freq: Four times a day (QID) | RESPIRATORY_TRACT | 3 refills | Status: AC | PRN
  Filled 2024-06-18: qty 150, 13d supply, fill #0

## 2024-06-18 MED ORDER — PREDNISONE 20 MG PO TABS
40.0000 mg | ORAL_TABLET | Freq: Every day | ORAL | 0 refills | Status: AC
  Filled 2024-06-18: qty 10, 5d supply, fill #0

## 2024-06-18 MED ORDER — ALBUTEROL SULFATE 4 MG PO TABS
4.0000 mg | ORAL_TABLET | Freq: Three times a day (TID) | ORAL | 1 refills | Status: DC
  Filled 2024-06-18: qty 90, 30d supply, fill #0
  Filled 2024-07-12: qty 90, 30d supply, fill #1

## 2024-06-18 MED ORDER — MONTELUKAST SODIUM 10 MG PO TABS
10.0000 mg | ORAL_TABLET | Freq: Every day | ORAL | 1 refills | Status: AC
  Filled 2024-06-18 – 2024-07-14 (×2): qty 90, 90d supply, fill #0
  Filled 2024-10-07: qty 90, 90d supply, fill #1

## 2024-06-18 MED ORDER — GUAIFENESIN 100 MG/5ML PO LIQD
5.0000 mL | ORAL | 0 refills | Status: AC | PRN
  Filled 2024-06-18: qty 120, 4d supply, fill #0

## 2024-06-18 MED ORDER — PANTOPRAZOLE SODIUM 40 MG PO TBEC
40.0000 mg | DELAYED_RELEASE_TABLET | Freq: Every day | ORAL | 1 refills | Status: AC
  Filled 2024-06-18: qty 90, 90d supply, fill #0
  Filled 2024-09-10: qty 90, 90d supply, fill #1

## 2024-06-18 MED ORDER — MEMANTINE HCL 5 MG PO TABS
5.0000 mg | ORAL_TABLET | Freq: Two times a day (BID) | ORAL | 11 refills | Status: AC
  Filled 2024-06-18: qty 60, 30d supply, fill #0
  Filled 2024-07-12: qty 60, 30d supply, fill #1
  Filled 2024-08-11: qty 60, 30d supply, fill #2
  Filled 2024-09-10: qty 60, 30d supply, fill #3
  Filled 2024-10-05: qty 60, 30d supply, fill #4

## 2024-06-18 MED ORDER — AIRSUPRA 90-80 MCG/ACT IN AERO
2.0000 | INHALATION_SPRAY | Freq: Four times a day (QID) | RESPIRATORY_TRACT | 1 refills | Status: AC | PRN
  Filled 2024-06-18 – 2024-10-17 (×2): qty 32.1, 90d supply, fill #0

## 2024-06-18 NOTE — Patient Instructions (Addendum)
 You have received a steroid injection in the office today.  I have sent in prednisone  for you to take 2 tablets once daily in the morning with breakfast for the next 5 days.  We are getting an xray today. We will be in contact with any abnormal results that require further attention.  I have sent in a referral for PT/OT to come out and evaluate for treatment. Someone should be reaching out to get you scheduled.   Follow-up with me for new or worsening symptoms.

## 2024-06-19 ENCOUNTER — Other Ambulatory Visit (HOSPITAL_COMMUNITY): Payer: Self-pay

## 2024-06-19 ENCOUNTER — Ambulatory Visit: Payer: Self-pay | Admitting: Family Medicine

## 2024-06-19 ENCOUNTER — Other Ambulatory Visit (HOSPITAL_BASED_OUTPATIENT_CLINIC_OR_DEPARTMENT_OTHER): Payer: Self-pay

## 2024-06-19 ENCOUNTER — Other Ambulatory Visit: Payer: Self-pay

## 2024-06-24 DIAGNOSIS — R531 Weakness: Secondary | ICD-10-CM | POA: Insufficient documentation

## 2024-06-24 DIAGNOSIS — Z79899 Other long term (current) drug therapy: Secondary | ICD-10-CM | POA: Insufficient documentation

## 2024-06-24 DIAGNOSIS — K219 Gastro-esophageal reflux disease without esophagitis: Secondary | ICD-10-CM | POA: Insufficient documentation

## 2024-06-25 ENCOUNTER — Other Ambulatory Visit: Payer: Self-pay

## 2024-06-25 NOTE — Patient Outreach (Signed)
 Complex Care Management   Visit Note  06/25/2024  Name:  Myron Lona MRN: [id number] DOB: 02-04-1951  Situation: Referral received for Complex Care Management related to HTN I obtained verbal consent from Caregiver.  Visit completed with Caregiver  on the phone  Background:   Past Medical History:  Diagnosis Date   Allergy    Asthma    Hypertension    Mental retardation     Assessment: Per sister, Vina Bennet(Caregiver), patient is doing well. Patient has had acute visit with Primary provider office on 06/18/24 for acute asthma flare-states referral for home health PT/OT taken care of. [facility] is active with [facility] for personal care needs. HTN controlled per PCP visit on 03/12/2024. Per review of chart, Elevated BP at acute visit on 06/18/24 during acute asthma flare (treated with Prednisone /medications/cxr(no pneumonia). RNCM discussed patient's increased BP during that visit with Ms. Blair. She is unable to provide follow up BP results at this time. But states she continues to monitor patient for care needs. She also reiterated Home health PT/OT has also been arranged. She denies the need to continue with ongoing care management services at this time, adding she is managing patient's care needs and is communicating with patient's PCP with any questions or concerns. Ms. Patience states she will call PCP and/or RNCM if care management needs in the future. Patient Reported Symptoms:  Cognitive Cognitive Status: Requires Assistance Decision Making (healthcare managed by family. patient with Dementia)      Neurological Neurological Review of Symptoms: No symptoms reported    HEENT HEENT Symptoms Reported: No symptoms reported      Cardiovascular Cardiovascular Symptoms Reported: No symptoms reported Does patient have uncontrolled Hypertension?: No Cardiovascular Management Strategies: Medication therapy, Routine screening, Medical device Cardiovascular Comment: per  review of chart BP controlled. Noted elevated BP at acute asthma visit. sister reports patient's is doing good now. unable to state repeat BP reading. States home health PT/OT will be following patient. Sister to contact patient's PCP if BP outside recommended ranges or with questions or concerns.  Respiratory Respiratory Symptoms Reported: No symptoms reported Other Respiratory Symptoms: sister denies patient is having any difficulty at this time since treatment at acute visit on 06/18/24-patient treated for asthma exacerbation.    Endocrine Endocrine Symptoms Reported: No symptoms reported    Gastrointestinal Gastrointestinal Symptoms Reported: No symptoms reported      Genitourinary Genitourinary Symptoms Reported: No symptoms reported    Integumentary Integumentary Symptoms Reported: No symptoms reported    Musculoskeletal Musculoskelatal Symptoms Reviewed: No symptoms reported        Psychosocial Psychosocial Symptoms Reported: Not assessed (patient not available.)          There were no vitals filed for this visit.  Medications Reviewed Today   Medications were not reviewed in this encounter    Recommendation:   Follow up with PCP as scheduled/recommended or with health questions or concerns.  Follow Up Plan:   Closing From:  Complex Care Management  Heddy Shutter, RN, MSN, BSN, CCM [place]  [facility], Population Health Case Manager Phone: [phone]

## 2024-06-25 NOTE — Patient Instructions (Signed)
 Visit Information  Thank you for taking time to visit with me today. Please don't hesitate to contact me if I can be of assistance to you.  Closing From: Complex Care Management.  Please call the Suicide and Crisis Lifeline: 988 call the [place]  National Suicide Prevention Lifeline: (405) 552-5921 or TTY: 8731126003 TTY 850-229-4755) to talk to a trained counselor if you are experiencing a Mental Health or Behavioral Health Crisis or need someone to talk to.  Heddy Shutter, RN, MSN, BSN, CCM [place]  [facility], Population Health Case Manager Phone: [phone]

## 2024-07-03 ENCOUNTER — Ambulatory Visit: Admitting: Internal Medicine

## 2024-07-12 ENCOUNTER — Other Ambulatory Visit: Payer: Self-pay

## 2024-07-12 ENCOUNTER — Other Ambulatory Visit (HOSPITAL_COMMUNITY): Payer: Self-pay

## 2024-07-13 ENCOUNTER — Other Ambulatory Visit: Payer: Self-pay

## 2024-07-14 ENCOUNTER — Other Ambulatory Visit (HOSPITAL_COMMUNITY): Payer: Self-pay

## 2024-07-22 ENCOUNTER — Other Ambulatory Visit: Payer: Self-pay | Admitting: Internal Medicine

## 2024-07-22 DIAGNOSIS — M1A09X0 Idiopathic chronic gout, multiple sites, without tophus (tophi): Secondary | ICD-10-CM

## 2024-07-23 ENCOUNTER — Other Ambulatory Visit: Payer: Self-pay

## 2024-07-23 ENCOUNTER — Other Ambulatory Visit (HOSPITAL_COMMUNITY): Payer: Self-pay

## 2024-07-23 MED ORDER — COLCHICINE 0.6 MG PO TABS
0.6000 mg | ORAL_TABLET | Freq: Two times a day (BID) | ORAL | 0 refills | Status: DC
  Filled 2024-07-23: qty 180, 90d supply, fill #0

## 2024-07-30 ENCOUNTER — Other Ambulatory Visit (HOSPITAL_COMMUNITY): Payer: Self-pay

## 2024-08-11 ENCOUNTER — Other Ambulatory Visit: Payer: Self-pay | Admitting: Family Medicine

## 2024-08-11 DIAGNOSIS — J4541 Moderate persistent asthma with (acute) exacerbation: Secondary | ICD-10-CM

## 2024-08-13 ENCOUNTER — Other Ambulatory Visit: Payer: Self-pay

## 2024-08-13 ENCOUNTER — Other Ambulatory Visit (HOSPITAL_COMMUNITY): Payer: Self-pay

## 2024-08-13 MED ORDER — ALBUTEROL SULFATE 4 MG PO TABS
4.0000 mg | ORAL_TABLET | Freq: Three times a day (TID) | ORAL | 1 refills | Status: DC
  Filled 2024-08-13: qty 90, 30d supply, fill #0
  Filled 2024-09-10: qty 90, 30d supply, fill #1

## 2024-08-30 ENCOUNTER — Other Ambulatory Visit (HOSPITAL_COMMUNITY): Payer: Self-pay

## 2024-08-31 ENCOUNTER — Ambulatory Visit: Payer: Self-pay

## 2024-08-31 NOTE — Telephone Encounter (Signed)
 FYI Only or Action Required?: FYI only for provider: appointment scheduled on 09/03/24.  Patient was last seen in primary care on 06/18/2024 by Alvia Corean CROME, FNP.  Called Nurse Triage reporting Cough.  Symptoms began several days ago.  Interventions attempted: OTC medications: Mucinex  and Prescription medications: Inhaler, Nebulizer.  Symptoms are: stable.  Triage Disposition: See Physician Within 24 Hours  Patient/caregiver understands and will follow disposition?: Yes Reason for Disposition  [1] Known COPD or other severe lung disease (i.e., bronchiectasis, cystic fibrosis, lung surgery) AND [2] symptoms getting worse (i.e., increased sputum purulence or amount, increased breathing difficulty  Answer Assessment - Initial Assessment Questions Patient's sister Earnestine calling in today. Patient been taking Mucinex , Inhalers, nebulizer solution. Looking to get patient seen today, advised no appointments until Monday, Advised UC, Sunny still wanted to schedule patient and stated she will take her to UC if symptoms worsen  1. ONSET: When did the cough begin?      2 days ago  2. SEVERITY: How bad is the cough today?      Worsening  3. SPUTUM: Describe the color of your sputum (e.g., none, dry cough; clear, white, yellow, green)     Not as productive, hard to spit up  5. DIFFICULTY BREATHING: Are you having difficulty breathing? If Yes, ask: How bad is it? (e.g., mild, moderate, severe)      Mild SOB baseline, worsening a bit with cough  6. FEVER: Do you have a fever? If Yes, ask: What is your temperature, how was it measured, and when did it start?     Denies  7. CARDIAC HISTORY: Do you have any history of heart disease? (e.g., heart attack, congestive heart failure)      Denies  8. LUNG HISTORY: Do you have any history of lung disease?  (e.g., pulmonary embolus, asthma, emphysema)     Asthma  9. OTHER SYMPTOMS: Do you have any other symptoms? (e.g.,  runny nose, wheezing, chest pain)       Wheezing  Protocols used: Cough - Acute Productive-A-AH  Copied from CRM #[id number]. Topic: Clinical - Red Word Triage >> Aug 31, 2024 10:31 AM Drema MATSU wrote: Red Word that prompted transfer to Nurse Triage: Patient had really bad coughing and asthma (wheezing) during the night.   ----------------------------------------------------------------------- From previous Reason for Contact - Scheduling: Patient/patient representative is calling to schedule an appointment. Refer to attachments for appointment information.

## 2024-09-03 ENCOUNTER — Ambulatory Visit: Admitting: Emergency Medicine

## 2024-09-03 ENCOUNTER — Encounter: Payer: Self-pay | Admitting: Emergency Medicine

## 2024-09-03 ENCOUNTER — Other Ambulatory Visit (HOSPITAL_COMMUNITY): Payer: Self-pay

## 2024-09-03 ENCOUNTER — Other Ambulatory Visit: Payer: Self-pay

## 2024-09-03 VITALS — BP 146/100 | HR 102 | Temp 98.7°F | Ht 67.0 in | Wt 196.0 lb

## 2024-09-03 DIAGNOSIS — I10 Essential (primary) hypertension: Secondary | ICD-10-CM

## 2024-09-03 DIAGNOSIS — J4541 Moderate persistent asthma with (acute) exacerbation: Secondary | ICD-10-CM | POA: Diagnosis not present

## 2024-09-03 MED ORDER — AMLODIPINE BESYLATE 5 MG PO TABS
5.0000 mg | ORAL_TABLET | Freq: Every day | ORAL | 3 refills | Status: AC
  Filled 2024-09-03 (×2): qty 90, 90d supply, fill #0

## 2024-09-03 MED ORDER — METHYLPREDNISOLONE ACETATE 80 MG/ML IJ SUSP
80.0000 mg | Freq: Once | INTRAMUSCULAR | Status: AC
  Administered 2024-09-03: 14:00:00 80 mg via INTRAMUSCULAR

## 2024-09-03 MED ORDER — METHYLPREDNISOLONE ACETATE 80 MG/ML IJ SUSP: 80.0000 mg | Freq: Once | INTRAMUSCULAR | Status: DC

## 2024-09-03 MED ORDER — AZITHROMYCIN 250 MG PO TABS
ORAL_TABLET | ORAL | 0 refills | Status: AC
  Filled 2024-09-03 (×2): qty 6, 5d supply, fill #0

## 2024-09-03 MED ORDER — PREDNISONE 20 MG PO TABS
40.0000 mg | ORAL_TABLET | Freq: Every day | ORAL | 0 refills | Status: AC
  Filled 2024-09-03 (×2): qty 10, 5d supply, fill #0

## 2024-09-03 NOTE — Assessment & Plan Note (Addendum)
 Clinically stable.  No red flags signs or symptoms No signs of pneumonia however lots of wheezing and chest congestion on physical examination Recommend DuoNeb nebulizer in the office with IM Depo-Medrol  Continue nebulizers at home and start prednisone  40 mg daily for 5 days Recommend to start azithromycin  daily for 5 days Advised to rest and stay well-hydrated Continue maintenance nebulizer ED precautions given Advised to contact the office if no better or worse during the next couple days Should follow-up with PCP within the next week or 2.

## 2024-09-03 NOTE — Patient Instructions (Signed)
 Asthma, Adult  Asthma is a condition that causes swelling and narrowing of the airways. These are the passages that lead from the nose and mouth down into the lungs. When asthma symptoms get worse it is called an asthma attack or flare. This can make it hard to breathe. Asthma flares can range from minor to life-threatening. There is no cure for asthma, but medicines and lifestyle changes can help to control it. What are the causes? It is not known exactly what causes asthma, but certain things can cause asthma symptoms to get worse (triggers). What can trigger an asthma attack? Cigarette smoke. Mold. Dust. Your pet's skin flakes (dander). Cockroaches. Pollen. Air pollution (like household cleaners, wood smoke, smog, or Therapist, occupational). What are the signs or symptoms? Trouble breathing (shortness of breath). Coughing. Making high-pitched whistling sounds when you breathe, most often when you breathe out (wheezing). Chest tightness. Tiredness with little activity. Poor exercise tolerance. How is this treated? Controller medicines that help prevent asthma symptoms. Fast-acting reliever or rescue medicines. These give short-term relief of asthma symptoms. Allergy medicines if your attacks are brought on by allergens. Medicines to help control the body's defense (immune) system. Staying away from the things that cause asthma attacks. Follow these instructions at home: Avoiding triggers in your home Do not allow anyone to smoke in your home. Limit use of fireplaces and wood stoves. Get rid of pests (such as roaches and mice) and their droppings. Keep your home clean. Clean your floors. Dust regularly. Use cleaning products that do not smell. Wash bed sheets and blankets every week in hot water. Dry them in a dryer. Have someone vacuum when you are not home. Change your heating and air conditioning filters often. Use blankets that are made of polyester or cotton. General  instructions Take over-the-counter and prescription medicines only as told by your doctor. Do not smoke or use any products that contain nicotine or tobacco. If you need help quitting, ask your doctor. Stay away from secondhand smoke. Avoid doing things outdoors when allergen counts are high and when air quality is low. Warm up before you exercise. Take time to cool down after exercise. Use a peak flow meter as told by your doctor. A peak flow meter is a tool that measures how well your lungs are working. Keep track of the peak flow meter's readings. Write them down. Follow your asthma action plan. This is a written plan for taking care of your asthma and treating your attacks. Make sure you get all the shots (vaccines) that your doctor recommends. Ask your doctor about a flu shot and a pneumonia shot. Keep all follow-up visits. Contact a doctor if: You have wheezing, shortness of breath, or a cough even while taking medicine to prevent attacks. The mucus you cough up (sputum) is thicker than usual. The mucus you cough up changes from clear or white to yellow, green, gray, or is bloody. You have problems from the medicine you are taking, such as: A rash. Itching. Swelling. Trouble breathing. You need reliever medicines more than 2-3 times a week. Your peak flow reading is still at 50-79% of your personal best after following the action plan for 1 hour. You have a fever. Get help right away if: You seem to be worse and are not responding to medicine during an asthma attack. You are short of breath even at rest. You get short of breath when doing very little activity. You have trouble eating, drinking, or talking. You have chest  pain or tightness. You have a fast heartbeat. Your lips or fingernails start to turn blue. You are light-headed or dizzy, or you faint. Your peak flow is less than 50% of your personal best. You feel too tired to breathe normally. These symptoms may be an  emergency. Get help right away. Call 911. Do not wait to see if the symptoms will go away. Do not drive yourself to the hospital. Summary Asthma is a long-term (chronic) condition in which the airways get tight and narrow. An asthma attack can make it hard to breathe. Asthma cannot be cured, but medicines and lifestyle changes can help control it. Make sure you understand how to avoid triggers and how and when to use your medicines. Avoid things that can cause allergy symptoms (allergens). These include animal skin flakes (dander) and pollen from trees or grass. Avoid things that pollute the air. These may include household cleaners, wood smoke, smog, or chemical odors. This information is not intended to replace advice given to you by your health care provider. Make sure you discuss any questions you have with your health care provider. Document Revised: 06/15/2021 Document Reviewed: 06/15/2021 Elsevier Patient Education  2024 [employer].

## 2024-09-03 NOTE — Assessment & Plan Note (Signed)
 BP Readings from Last 3 Encounters:  09/03/24 (!) 146/100  06/18/24 (!) 160/104  03/12/24 (!) 146/78  Elevated blood pressure in the office today due to acute asthma exacerbation.  Not on antihypertensive medication at present time Recommend to start amlodipine  5 mg daily Advised to monitor blood pressure readings at home daily for the next couple weeks and contact the office if numbers persistently abnormal Needs to follow-up with PCP regarding this finding.

## 2024-09-03 NOTE — Progress Notes (Signed)
 Alyssa Powell 73 y.o.   Chief Complaint  Patient presents with   Cough    asthma exacerbation pt is having a lot of wheezing and congestion in the chest     HISTORY OF PRESENT ILLNESS: Acute problem visit today. This is a 73 y.o. female with history of asthma complaining of chest congestion and wheezing for the last several days Accompanied by sister today No other associated symptoms No other complaints or medical concerns today  Cough Associated symptoms include wheezing. Pertinent negatives include no chest pain, chills, fever, headaches, rash or sore throat.     Prior to Admission medications  Medication Sig Start Date End Date Taking? Authorizing Provider  albuterol  (PROVENTIL ) (2.5 MG/3ML) 0.083% nebulizer solution Take 3 mLs (2.5 mg total) by nebulization every 6 (six) hours as needed for wheezing or shortness of breath. 06/18/24   Alvia Corean CROME, FNP  albuterol  (PROVENTIL ) 4 MG tablet Take 1 tablet (4 mg total) by mouth 3 (three) times daily. 08/13/24   Joshua Debby CROME, MD  Albuterol -Budesonide  (AIRSUPRA ) 90-80 MCG/ACT AERO Inhale 2 puffs into the lungs 4 (four) times daily as needed. 06/18/24   Alvia Corean CROME, FNP  budesonide  (PULMICORT ) 0.5 MG/2ML nebulizer solution Take 2 mLs (0.5 mg total) by nebulization 2 (two) times daily. 06/18/24   Alvia Corean CROME, FNP  cetirizine  (ZYRTEC ) 10 MG tablet TAKE 1 TABLET BY MOUTH EVERYDAY AT BEDTIME 02/03/22   Tanda Bleacher, MD  colchicine  0.6 MG tablet Take 1 tablet (0.6 mg total) by mouth 2 (two) times daily. 07/23/24   Joshua Debby CROME, MD  guaiFENesin  (ROBITUSSIN) 100 MG/5ML liquid Take 5 mLs by mouth every 4 (four) hours as needed for cough or to loosen phlegm. 06/18/24   Alvia Corean CROME, FNP  memantine  (NAMENDA ) 5 MG tablet Take 1 tablet (5 mg total) by mouth 2 (two) times daily. 06/18/24   Alvia Corean CROME, FNP  montelukast  (SINGULAIR ) 10 MG tablet Take 1 tablet (10 mg total) by mouth at bedtime. 06/18/24    Alvia Corean CROME, FNP  pantoprazole  (PROTONIX ) 40 MG tablet Take 1 tablet (40 mg total) by mouth daily. 06/18/24   Alvia Corean CROME, FNP  rosuvastatin  (CRESTOR ) 10 MG tablet Take 1 tablet (10 mg total) by mouth daily. 06/18/24   Alvia Corean CROME, FNP  sertraline  (ZOLOFT ) 100 MG tablet Take 1 tablet (100 mg total) by mouth daily. 06/18/24   Alvia Corean CROME, FNP  Zoster Vaccine Adjuvanted (SHINGRIX ) injection Inject 0.5 mLs into the muscle once for 1 dose. 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) 03/23/2023   Encounter for general adult medical examination with abnormal findings 07/04/2022   Class 1 obesity due to excess calories with serious comorbidity and body mass index (BMI) of 34.0 to 34.9 in adult 06/28/2022   Screen for colon cancer 06/28/2022   Visit for screening mammogram 06/28/2022   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/28/2022   Hyperlipidemia with target LDL less than 130 06/28/2022   Moderate persistent asthma 09/05/2018   Essential hypertension 09/05/2018   Major depressive disorder with single episode 09/05/2018   Osteoarthritis, knee 12/16/2011   Allergic rhinitis 08/07/2009   Mild  intellectual disability 11/17/2006    Past Medical History:  Diagnosis Date   Allergy    Asthma    Hypertension    Mental retardation     Past Surgical History:  Procedure Laterality Date   HERNIA REPAIR      Social History   Socioeconomic History   Marital status: Single    Spouse name: Not on file   Number of children: 2   Years of education: 7   Highest education level: Not on file   Occupational History   Not on file  Tobacco Use   Smoking status: Never   Smokeless tobacco: Never  Vaping Use   Vaping status: Never Used  Substance and Sexual Activity   Alcohol use: Not Currently   Drug use: Never   Sexual activity: Never  Other Topics Concern   Not on file  Social History Narrative   ** Merged History Encounter **    Lives with her mother   Right handed   Drinks caffeine   Unable to work   Social Drivers of Health   Tobacco Use: Low Risk (09/03/2024)   Patient History    Smoking Tobacco Use: Never    Smokeless Tobacco Use: Never    Passive Exposure: Not on file  Financial Resource Strain: Low Risk (05/04/2024)   Overall Financial Resource Strain (CARDIA)    Difficulty of Paying Living Expenses: Not hard at all  Food Insecurity: No Food Insecurity (05/04/2024)   Epic    Worried About Radiation Protection Practitioner of Food in the Last Year: Never true    Ran Out of Food in the Last Year: Never true  Transportation Needs: No Transportation Needs (05/04/2024)   Epic    Lack of Transportation (Medical): No    Lack of Transportation (Non-Medical): No  Physical Activity: Insufficiently Active (05/04/2024)   Exercise Vital Sign    Days of Exercise per Week: 7 days    Minutes of Exercise per Session: 20 min  Stress: No Stress Concern Present (05/04/2024)   [employer] of Occupational Health - Occupational Stress Questionnaire    Feeling of Stress: Not at all  Social Connections: Socially Isolated (05/04/2024)   Social Connection and Isolation Panel    Frequency of Communication with Friends and Family: More than three times a week    Frequency of Social Gatherings with Friends and Family: Twice a week    Attends Religious Services: Never    Database Administrator or Organizations: No    Attends Banker Meetings: Never    Marital Status: Never married  Intimate Partner Violence: Not At Risk (05/04/2024)   Epic    Fear of Current or Ex-Partner: No     Emotionally Abused: No    Physically Abused: No    Sexually Abused: No  Depression (PHQ2-9): Low Risk (09/03/2024)   Depression (PHQ2-9)    PHQ-2 Score: 0  Alcohol Screen: Low Risk (05/04/2024)   Alcohol Screen    Last Alcohol Screening Score (AUDIT): 0  Housing: Unknown (05/04/2024)   Epic    Unable to Pay for Housing in the Last Year: No    Number of Times Moved in the Last Year: Not on file    Homeless in the Last Year: No  Utilities: Not At Risk (05/04/2024)   Epic    Threatened with loss of utilities: No  Health Literacy: Adequate Health Literacy (05/04/2024)   B1300 Health Literacy    Frequency of need for help with medical instructions:  Never    Family History  Problem Relation Age of Onset   Arthritis Mother    Asthma Father    Alcoholism Father    Asthma Sister    Diabetes Brother    Alcoholism Brother      Review of Systems  Constitutional: Negative.  Negative for chills and fever.  HENT:  Positive for congestion. Negative for sore throat.   Respiratory:  Positive for cough and wheezing.   Cardiovascular: Negative.  Negative for chest pain and palpitations.  Gastrointestinal:  Negative for abdominal pain, diarrhea, nausea and vomiting.  Genitourinary: Negative.  Negative for dysuria and hematuria.  Skin: Negative.  Negative for rash.  Neurological: Negative.  Negative for dizziness and headaches.  All other systems reviewed and are negative.   Today's Vitals   09/03/24 1308  BP: (!) 146/100  Pulse: (!) 102  Temp: 98.7 F (37.1 C)  TempSrc: Oral  Weight: 196 lb (88.9 kg)  Height: 5' 7 (1.702 m)   Body mass index is 30.7 kg/m.   Physical Exam Vitals reviewed.  Constitutional:      Appearance: Normal appearance.  HENT:     Head: Normocephalic.     Mouth/Throat:     Mouth: Mucous membranes are moist.     Pharynx: Oropharynx is clear.  Eyes:     Extraocular Movements: Extraocular movements intact.     Pupils: Pupils are equal, round, and  reactive to light.  Cardiovascular:     Rate and Rhythm: Normal rate and regular rhythm.     Pulses: Normal pulses.     Heart sounds: Normal heart sounds.  Pulmonary:     Effort: Pulmonary effort is normal. No respiratory distress.     Breath sounds: Wheezing and rhonchi present. No rales.  Musculoskeletal:     Cervical back: No tenderness.  Lymphadenopathy:     Cervical: No cervical adenopathy.  Skin:    General: Skin is warm and dry.  Neurological:     Mental Status: She is alert and oriented to person, place, and time.  Psychiatric:        Mood and Affect: Mood normal.        Behavior: Behavior normal.      ASSESSMENT & PLAN: A total of 40 minutes was spent with the patient and counseling/coordination of care regarding preparing for this visit, review of most recent office visit notes, review of multiple chronic medical conditions and their management, management of asthma exacerbation, management of uncontrolled hypertension, review of all medications, review of most recent bloodwork results, review of health maintenance items, education on nutrition, prognosis, documentation, and need for follow up.   Problem List Items Addressed This Visit       Cardiovascular and Mediastinum   Essential hypertension   BP Readings from Last 3 Encounters:  09/03/24 (!) 146/100  06/18/24 (!) 160/104  03/12/24 (!) 146/78  Elevated blood pressure in the office today due to acute asthma exacerbation.  Not on antihypertensive medication at present time Recommend to start amlodipine  5 mg daily Advised to monitor blood pressure readings at home daily for the next couple weeks and contact the office if numbers persistently abnormal Needs to follow-up with PCP regarding this finding.       Relevant Medications   amLODipine  (NORVASC ) 5 MG tablet     Respiratory   Moderate persistent asthma - Primary   Clinically stable.  No red flags signs or symptoms No signs of pneumonia however lots of  wheezing and  chest congestion on physical examination Recommend DuoNeb nebulizer in the office with IM Depo-Medrol  Continue nebulizers at home and start prednisone  40 mg daily for 5 days Recommend to start azithromycin  daily for 5 days Advised to rest and stay well-hydrated Continue maintenance nebulizer ED precautions given Advised to contact the office if no better or worse during the next couple days Should follow-up with PCP within the next week or 2.      Relevant Medications   methylPREDNISolone  acetate (DEPO-MEDROL ) injection 80 mg   azithromycin  (ZITHROMAX ) 250 MG tablet   predniSONE  (DELTASONE ) 20 MG tablet   Patient Instructions  Asthma, Adult  Asthma is a condition that causes swelling and narrowing of the airways. These are the passages that lead from the nose and mouth down into the lungs. When asthma symptoms get worse it is called an asthma attack or flare. This can make it hard to breathe. Asthma flares can range from minor to life-threatening. There is no cure for asthma, but medicines and lifestyle changes can help to control it. What are the causes? It is not known exactly what causes asthma, but certain things can cause asthma symptoms to get worse (triggers). What can trigger an asthma attack? Cigarette smoke. Mold. Dust. Your pet's skin flakes (dander). Cockroaches. Pollen. Air pollution (like household cleaners, wood smoke, smog, or therapist, occupational). What are the signs or symptoms? Trouble breathing (shortness of breath). Coughing. Making high-pitched whistling sounds when you breathe, most often when you breathe out (wheezing). Chest tightness. Tiredness with little activity. Poor exercise tolerance. How is this treated? Controller medicines that help prevent asthma symptoms. Fast-acting reliever or rescue medicines. These give short-term relief of asthma symptoms. Allergy medicines if your attacks are brought on by allergens. Medicines to help control  the body's defense (immune) system. Staying away from the things that cause asthma attacks. Follow these instructions at home: Avoiding triggers in your home Do not allow anyone to smoke in your home. Limit use of fireplaces and wood stoves. Get rid of pests (such as roaches and mice) and their droppings. Keep your home clean. Clean your floors. Dust regularly. Use cleaning products that do not smell. Wash bed sheets and blankets every week in hot water. Dry them in a dryer. Have someone vacuum when you are not home. Change your heating and air conditioning filters often. Use blankets that are made of polyester or cotton. General instructions Take over-the-counter and prescription medicines only as told by your doctor. Do not smoke or use any products that contain nicotine or tobacco. If you need help quitting, ask your doctor. Stay away from secondhand smoke. Avoid doing things outdoors when allergen counts are high and when air quality is low. Warm up before you exercise. Take time to cool down after exercise. Use a peak flow meter as told by your doctor. A peak flow meter is a tool that measures how well your lungs are working. Keep track of the peak flow meter's readings. Write them down. Follow your asthma action plan. This is a written plan for taking care of your asthma and treating your attacks. Make sure you get all the shots (vaccines) that your doctor recommends. Ask your doctor about a flu shot and a pneumonia shot. Keep all follow-up visits. Contact a doctor if: You have wheezing, shortness of breath, or a cough even while taking medicine to prevent attacks. The mucus you cough up (sputum) is thicker than usual. The mucus you cough up changes from clear or white  to yellow, green, gray, or is bloody. You have problems from the medicine you are taking, such as: A rash. Itching. Swelling. Trouble breathing. You need reliever medicines more than 2-3 times a week. Your peak  flow reading is still at 50-79% of your personal best after following the action plan for 1 hour. You have a fever. Get help right away if: You seem to be worse and are not responding to medicine during an asthma attack. You are short of breath even at rest. You get short of breath when doing very little activity. You have trouble eating, drinking, or talking. You have chest pain or tightness. You have a fast heartbeat. Your lips or fingernails start to turn blue. You are light-headed or dizzy, or you faint. Your peak flow is less than 50% of your personal best. You feel too tired to breathe normally. These symptoms may be an emergency. Get help right away. Call 911. Do not wait to see if the symptoms will go away. Do not drive yourself to the hospital. Summary Asthma is a long-term (chronic) condition in which the airways get tight and narrow. An asthma attack can make it hard to breathe. Asthma cannot be cured, but medicines and lifestyle changes can help control it. Make sure you understand how to avoid triggers and how and when to use your medicines. Avoid things that can cause allergy symptoms (allergens). These include animal skin flakes (dander) and pollen from trees or grass. Avoid things that pollute the air. These may include household cleaners, wood smoke, smog, or chemical odors. This information is not intended to replace advice given to you by your health care provider. Make sure you discuss any questions you have with your health care provider. Document Revised: 06/15/2021 Document Reviewed: 06/15/2021 Elsevier Patient Education  2024 [employer]    Emil Schaumann, MD [facility]    [1]  Allergies Allergen Reactions   Amoxicillin Hives   Codeine Phosphate Hives   Penicillins     Childhood allergy

## 2024-09-10 ENCOUNTER — Other Ambulatory Visit: Payer: Self-pay

## 2024-10-06 ENCOUNTER — Other Ambulatory Visit: Payer: Self-pay | Admitting: Internal Medicine

## 2024-10-06 DIAGNOSIS — J4541 Moderate persistent asthma with (acute) exacerbation: Secondary | ICD-10-CM

## 2024-10-07 ENCOUNTER — Other Ambulatory Visit: Payer: Self-pay

## 2024-10-08 ENCOUNTER — Other Ambulatory Visit: Payer: Self-pay

## 2024-10-08 ENCOUNTER — Other Ambulatory Visit (HOSPITAL_COMMUNITY): Payer: Self-pay

## 2024-10-08 MED ORDER — ALBUTEROL SULFATE 4 MG PO TABS
4.0000 mg | ORAL_TABLET | Freq: Three times a day (TID) | ORAL | 1 refills | Status: AC
  Filled 2024-10-08: qty 90, 30d supply, fill #0

## 2024-10-17 ENCOUNTER — Other Ambulatory Visit (HOSPITAL_COMMUNITY): Payer: Self-pay

## 2024-10-17 ENCOUNTER — Other Ambulatory Visit: Payer: Self-pay

## 2024-10-20 ENCOUNTER — Other Ambulatory Visit: Payer: Self-pay | Admitting: Family Medicine

## 2024-10-20 ENCOUNTER — Other Ambulatory Visit: Payer: Self-pay | Admitting: Internal Medicine

## 2024-10-20 DIAGNOSIS — F324 Major depressive disorder, single episode, in partial remission: Secondary | ICD-10-CM

## 2024-10-20 DIAGNOSIS — M1A09X0 Idiopathic chronic gout, multiple sites, without tophus (tophi): Secondary | ICD-10-CM

## 2024-10-22 ENCOUNTER — Other Ambulatory Visit (HOSPITAL_COMMUNITY): Payer: Self-pay

## 2024-10-22 MED ORDER — SERTRALINE HCL 100 MG PO TABS
100.0000 mg | ORAL_TABLET | Freq: Every day | ORAL | 0 refills | Status: AC
  Filled 2024-10-22: qty 60, 60d supply, fill #0

## 2024-10-22 MED ORDER — COLCHICINE 0.6 MG PO TABS
0.6000 mg | ORAL_TABLET | Freq: Two times a day (BID) | ORAL | 0 refills | Status: AC
  Filled 2024-10-22: qty 120, 60d supply, fill #0

## 2025-05-08 ENCOUNTER — Ambulatory Visit
